# Patient Record
Sex: Female | Born: 1950
Health system: Southern US, Community
[De-identification: ages and names within clinical notes are randomized; demographics above are authoritative.]

## PROBLEM LIST (undated history)

## (undated) DIAGNOSIS — M1990 Unspecified osteoarthritis, unspecified site: Secondary | ICD-10-CM

## (undated) DIAGNOSIS — Z972 Presence of dental prosthetic device (complete) (partial): Secondary | ICD-10-CM

## (undated) DIAGNOSIS — Z973 Presence of spectacles and contact lenses: Secondary | ICD-10-CM

## (undated) DIAGNOSIS — I10 Essential (primary) hypertension: Secondary | ICD-10-CM

## (undated) DIAGNOSIS — M4310 Spondylolisthesis, site unspecified: Secondary | ICD-10-CM

## (undated) DIAGNOSIS — Z800 Family history of malignant neoplasm of digestive organs: Secondary | ICD-10-CM

## (undated) DIAGNOSIS — F32A Depression, unspecified: Secondary | ICD-10-CM

## (undated) DIAGNOSIS — R0600 Dyspnea, unspecified: Secondary | ICD-10-CM

## (undated) DIAGNOSIS — F329 Major depressive disorder, single episode, unspecified: Secondary | ICD-10-CM

## (undated) DIAGNOSIS — M533 Sacrococcygeal disorders, not elsewhere classified: Secondary | ICD-10-CM

## (undated) HISTORY — DX: Family history of malignant neoplasm of digestive organs: Z80.0

## (undated) HISTORY — PX: ABDOMINAL HYSTERECTOMY: SHX81

## (undated) HISTORY — PX: TUBAL LIGATION: SHX77

## (undated) HISTORY — PX: MULTIPLE TOOTH EXTRACTIONS: SHX2053

## (undated) HISTORY — DX: Dyspnea, unspecified: R06.00

## (undated) HISTORY — PX: ROTATOR CUFF REPAIR: SHX139

---

## 2014-09-28 NOTE — Patient Instructions (Addendum)
Your procedure is scheduled on: 10/06/2014  Report to [facility] at  1130   AM.  Call this number if you have problems the morning of surgery: [phone]   Do not eat food or drink liquids :After Midnight.      Take these medicines the morning of surgery with A SIP OF WATER: Norco and Xanax - if needed.   Do not wear jewelry, make-up or nail polish.  Do not wear lotions, powders, or perfumes.   Do not shave 48 hours prior to surgery.  Do not bring valuables to the hospital.  Contacts, dentures or bridgework may not be worn into surgery.  Leave suitcase in the car. After surgery it may be brought to your room.  For patients admitted to the hospital, checkout time is 11:00 AM the day of discharge.   Patients discharged the day of surgery will not be allowed to drive home.  :     Please read over the following fact sheets that you were given: Coughing and Deep Breathing, Surgical Site Infection Prevention, Anesthesia Post-op Instructions and Care and Recovery After Surgery    Cataract A cataract is a clouding of the lens of the eye. When a lens becomes cloudy, vision is reduced based on the degree and nature of the clouding. Many cataracts reduce vision to some degree. Some cataracts make people more near-sighted as they develop. Other cataracts increase glare. Cataracts that are ignored and become worse can sometimes look white. The white color can be seen through the pupil. CAUSES   Aging. However, cataracts may occur at any age, even in newborns.   Certain drugs.   Trauma to the eye.   Certain diseases such as diabetes.   Specific eye diseases such as chronic inflammation inside the eye or a sudden attack of a rare form of glaucoma.   Inherited or acquired medical problems.  SYMPTOMS   Gradual, progressive drop in vision in the affected eye.   Severe, rapid visual loss. This most often happens when trauma is the cause.  DIAGNOSIS  To detect a cataract, an eye doctor examines  the lens. Cataracts are best diagnosed with an exam of the eyes with the pupils enlarged (dilated) by drops.  TREATMENT  For an early cataract, vision may improve by using different eyeglasses or stronger lighting. If that does not help your vision, surgery is the only effective treatment. A cataract needs to be surgically removed when vision loss interferes with your everyday activities, such as driving, reading, or watching TV. A cataract may also have to be removed if it prevents examination or treatment of another eye problem. Surgery removes the cloudy lens and usually replaces it with a substitute lens (intraocular lens, IOL).  At a time when both you and your doctor agree, the cataract will be surgically removed. If you have cataracts in both eyes, only one is usually removed at a time. This allows the operated eye to heal and be out of danger from any possible problems after surgery (such as infection or poor wound healing). In rare cases, a cataract may be doing damage to your eye. In these cases, your caregiver may advise surgical removal right away. The vast majority of people who have cataract surgery have better vision afterward. HOME CARE INSTRUCTIONS  If you are not planning surgery, you may be asked to do the following:  Use different eyeglasses.   Use stronger or brighter lighting.   Ask your eye doctor about reducing your  medicine dose or changing medicines if it is thought that a medicine caused your cataract. Changing medicines does not make the cataract go away on its own.   Become familiar with your surroundings. Poor vision can lead to injury. Avoid bumping into things on the affected side. You are at a higher risk for tripping or falling.   Exercise extreme care when driving or operating machinery.   Wear sunglasses if you are sensitive to bright light or experiencing problems with glare.  SEEK IMMEDIATE MEDICAL CARE IF:   You have a worsening or sudden vision loss.    You notice redness, swelling, or increasing pain in the eye.   You have a fever.  Document Released: 08/26/2005 Document Revised: 08/15/2011 Document Reviewed: 04/19/2011 [facility] Patient Information 2012 [place].PATIENT INSTRUCTIONS POST-ANESTHESIA  IMMEDIATELY FOLLOWING SURGERY:  Do not drive or operate machinery for the first twenty four hours after surgery.  Do not make any important decisions for twenty four hours after surgery or while taking narcotic pain medications or sedatives.  If you develop intractable nausea and vomiting or a severe headache please notify your doctor immediately.  FOLLOW-UP:  Please make an appointment with your surgeon as instructed. You do not need to follow up with anesthesia unless specifically instructed to do so.  WOUND CARE INSTRUCTIONS (if applicable):  Keep a dry clean dressing on the anesthesia/puncture wound site if there is drainage.  Once the wound has quit draining you may leave it open to air.  Generally you should leave the bandage intact for twenty four hours unless there is drainage.  If the epidural site drains for more than 36-48 hours please call the anesthesia department.  QUESTIONS?:  Please feel free to call your physician or the hospital operator if you have any questions, and they will be happy to assist you.

## 2014-09-29 ENCOUNTER — Encounter (HOSPITAL_COMMUNITY): Payer: Self-pay

## 2014-09-29 ENCOUNTER — Encounter (HOSPITAL_COMMUNITY)
Admission: RE | Admit: 2014-09-29 | Discharge: 2014-09-29 | Disposition: A | Discharge status: Home / Self Care | Payer: BLUE CROSS/BLUE SHIELD | Source: Ambulatory Visit | Attending: Ophthalmology | Admitting: Ophthalmology

## 2014-09-29 DIAGNOSIS — H269 Unspecified cataract: Secondary | ICD-10-CM | POA: Diagnosis not present

## 2014-09-29 DIAGNOSIS — Z01818 Encounter for other preprocedural examination: Secondary | ICD-10-CM | POA: Diagnosis not present

## 2014-09-29 HISTORY — DX: Depression, unspecified: F32.A

## 2014-09-29 HISTORY — DX: Essential (primary) hypertension: I10

## 2014-09-29 HISTORY — DX: Major depressive disorder, single episode, unspecified: F32.9

## 2014-09-29 LAB — BASIC METABOLIC PANEL
Anion gap: 7 (ref 5–15)
BUN: 14 mg/dL (ref 6–23)
CALCIUM: 9.5 mg/dL (ref 8.4–10.5)
CO2: 28 mmol/L (ref 19–32)
Chloride: 104 mEq/L (ref 96–112)
Creatinine, Ser: 0.72 mg/dL (ref 0.50–1.10)
GFR calc non Af Amer: >90 mL/min (ref >90)
Glucose, Bld: 143 mg/dL — ABNORMAL HIGH (ref 70–99)
Potassium: 3.6 mmol/L (ref 3.5–5.1)
Sodium: 139 mmol/L (ref 135–145)

## 2014-09-29 LAB — CBC
HEMATOCRIT: 38.5 % (ref 36.0–46.0)
HEMOGLOBIN: 12.5 g/dL (ref 12.0–15.0)
MCH: 28.5 pg (ref 26.0–34.0)
MCHC: 32.5 g/dL (ref 30.0–36.0)
MCV: 87.9 fL (ref 78.0–100.0)
PLATELETS: 328 10*3/uL (ref 150–400)
RBC: 4.38 MIL/uL (ref 3.87–5.11)
RDW: 13.7 % (ref 11.5–15.5)
WBC: 6.5 10*3/uL (ref 4.0–10.5)

## 2014-10-05 MED ORDER — CYCLOPENTOLATE-PHENYLEPHRINE OP SOLN OPTIME - NO CHARGE
OPHTHALMIC | Status: AC
  Filled 2014-10-05: qty 2

## 2014-10-05 MED ORDER — TETRACAINE HCL 0.5 % OP SOLN
OPHTHALMIC | Status: AC
  Filled 2014-10-05: qty 2

## 2014-10-05 MED ORDER — NEOMYCIN-POLYMYXIN-DEXAMETH 3.5-10000-0.1 OP SUSP
OPHTHALMIC | Status: AC
  Filled 2014-10-05: qty 5

## 2014-10-05 MED ORDER — LIDOCAINE HCL (PF) 1 % IJ SOLN
INTRAMUSCULAR | Status: AC
  Filled 2014-10-05: qty 2

## 2014-10-05 MED ORDER — PHENYLEPHRINE HCL 2.5 % OP SOLN
OPHTHALMIC | Status: AC
  Filled 2014-10-05: qty 15

## 2014-10-05 MED ORDER — LIDOCAINE HCL 3.5 % OP GEL
OPHTHALMIC | Status: AC
  Filled 2014-10-05: qty 1

## 2014-10-06 ENCOUNTER — Encounter (HOSPITAL_COMMUNITY)
Admission: RE | Disposition: A | Discharge status: Home / Self Care | Payer: Self-pay | Source: Ambulatory Visit | Attending: Ophthalmology

## 2014-10-06 ENCOUNTER — Ambulatory Visit (HOSPITAL_COMMUNITY)
Admission: RE | Admit: 2014-10-06 | Discharge: 2014-10-06 | Disposition: A | Discharge status: Home / Self Care | Payer: BLUE CROSS/BLUE SHIELD | Source: Ambulatory Visit | Attending: Ophthalmology | Admitting: Ophthalmology

## 2014-10-06 ENCOUNTER — Ambulatory Visit (HOSPITAL_COMMUNITY): Payer: BLUE CROSS/BLUE SHIELD | Admitting: Anesthesiology

## 2014-10-06 ENCOUNTER — Encounter (HOSPITAL_COMMUNITY): Payer: Self-pay | Admitting: *Deleted

## 2014-10-06 DIAGNOSIS — H25812 Combined forms of age-related cataract, left eye: Secondary | ICD-10-CM | POA: Diagnosis not present

## 2014-10-06 HISTORY — PX: CATARACT EXTRACTION W/PHACO: SHX586

## 2014-10-06 SURGERY — PHACOEMULSIFICATION, CATARACT, WITH IOL INSERTION
Anesthesia: Monitor Anesthesia Care | Site: Eye | Laterality: Left

## 2014-10-06 MED ORDER — TETRACAINE HCL 0.5 % OP SOLN
1.0000 [drp] | OPHTHALMIC | Status: AC
  Administered 2014-10-06 (×3): 1 [drp] via OPHTHALMIC

## 2014-10-06 MED ORDER — MIDAZOLAM HCL 2 MG/2ML IJ SOLN
1.0000 mg | INTRAMUSCULAR | Status: DC | PRN
  Administered 2014-10-06: 2 mg via INTRAVENOUS

## 2014-10-06 MED ORDER — LIDOCAINE 3.5 % OP GEL OPTIME - NO CHARGE
OPHTHALMIC | Status: DC | PRN
  Administered 2014-10-06: 1 [drp] via OPHTHALMIC

## 2014-10-06 MED ORDER — BSS IO SOLN
INTRAOCULAR | Status: DC | PRN
  Administered 2014-10-06: 15 mL via INTRAOCULAR

## 2014-10-06 MED ORDER — NEOMYCIN-POLYMYXIN-DEXAMETH 3.5-10000-0.1 OP SUSP
OPHTHALMIC | Status: DC | PRN
  Administered 2014-10-06: 1 [drp] via OPHTHALMIC

## 2014-10-06 MED ORDER — EPINEPHRINE HCL 1 MG/ML IJ SOLN
INTRAMUSCULAR | Status: AC
  Filled 2014-10-06: qty 1

## 2014-10-06 MED ORDER — CYCLOPENTOLATE-PHENYLEPHRINE 0.2-1 % OP SOLN
1.0000 [drp] | OPHTHALMIC | Status: AC
Start: 2014-10-06 — End: 2014-10-06
  Administered 2014-10-06 (×3): 1 [drp] via OPHTHALMIC

## 2014-10-06 MED ORDER — FENTANYL CITRATE 0.05 MG/ML IJ SOLN
25.0000 ug | INTRAMUSCULAR | Status: AC
  Administered 2014-10-06 (×2): 25 ug via INTRAVENOUS

## 2014-10-06 MED ORDER — LACTATED RINGERS IV SOLN
INTRAVENOUS | Status: DC
  Administered 2014-10-06: 12:00:00 via INTRAVENOUS

## 2014-10-06 MED ORDER — LIDOCAINE HCL 3.5 % OP GEL
1.0000 "application " | Freq: Once | OPHTHALMIC | Status: AC
  Administered 2014-10-06: 1 via OPHTHALMIC

## 2014-10-06 MED ORDER — LIDOCAINE HCL (PF) 1 % IJ SOLN
INTRAMUSCULAR | Status: DC | PRN
  Administered 2014-10-06: .5 mL

## 2014-10-06 MED ORDER — PROVISC 10 MG/ML IO SOLN
INTRAOCULAR | Status: DC | PRN
  Administered 2014-10-06: 0.85 mL via INTRAOCULAR

## 2014-10-06 MED ORDER — PHENYLEPHRINE HCL 2.5 % OP SOLN
1.0000 [drp] | OPHTHALMIC | Status: AC
Start: 2014-10-06 — End: 2014-10-06
  Administered 2014-10-06 (×3): 1 [drp] via OPHTHALMIC

## 2014-10-06 MED ORDER — BSS IO SOLN
INTRAOCULAR | Status: DC | PRN
  Administered 2014-10-06: 12:00:00

## 2014-10-06 MED ORDER — FENTANYL CITRATE 0.05 MG/ML IJ SOLN
INTRAMUSCULAR | Status: AC
  Filled 2014-10-06: qty 2

## 2014-10-06 MED ORDER — MIDAZOLAM HCL 2 MG/2ML IJ SOLN
INTRAMUSCULAR | Status: AC
  Filled 2014-10-06: qty 2

## 2014-10-06 MED ORDER — POVIDONE-IODINE 5 % OP SOLN
OPHTHALMIC | Status: DC | PRN
  Administered 2014-10-06: 1 via OPHTHALMIC

## 2014-10-06 SURGICAL SUPPLY — 11 items
CLOTH BEACON ORANGE TIMEOUT ST (SAFETY) ×3 IMPLANT
EYE SHIELD UNIVERSAL CLEAR (GAUZE/BANDAGES/DRESSINGS) ×3 IMPLANT
GLOVE BIOGEL PI IND STRL 6.5 (GLOVE) ×1 IMPLANT
GLOVE BIOGEL PI INDICATOR 6.5 (GLOVE) ×2
GLOVE EXAM NITRILE LRG STRL (GLOVE) ×3 IMPLANT
PAD ARMBOARD 7.5X6 YLW CONV (MISCELLANEOUS) ×3 IMPLANT
SIGHTPATH CAT PROC W REG LENS (Ophthalmic Related) ×3 IMPLANT
SYRINGE LUER LOK 1CC (MISCELLANEOUS) ×3 IMPLANT
TAPE SURG TRANSPORE 1 IN (GAUZE/BANDAGES/DRESSINGS) ×1 IMPLANT
TAPE SURGICAL TRANSPORE 1 IN (GAUZE/BANDAGES/DRESSINGS) ×2
WATER STERILE IRR 250ML POUR (IV SOLUTION) ×3 IMPLANT

## 2014-10-06 NOTE — Discharge Instructions (Signed)

## 2014-10-06 NOTE — H&P (Signed)
I have reviewed the H&P, the patient was re-examined, and I have identified no interval changes in medical condition and plan of care since the history and physical of record  

## 2014-10-06 NOTE — Transfer of Care (Signed)
Immediate Anesthesia Transfer of Care Note  Patient: Kristin SalinasBarbara S Bush  Procedure(s) Performed: Procedure(s) with comments: CATARACT EXTRACTION PHACO AND INTRAOCULAR LENS PLACEMENT LEFT EYE (Left) - CDE:5.49  Patient Location: Short Stay  Anesthesia Type:MAC  Level of Consciousness: awake  Airway & Oxygen Therapy: Patient Spontanous Breathing  Post-op Assessment: Report given to PACU RN  Post vital signs: Reviewed  Last Vitals:  Filed Vitals:   10/06/14 1200  BP: 131/67  Pulse:   Temp:   Resp: 17    Complications: No apparent anesthesia complications

## 2014-10-06 NOTE — Anesthesia Preprocedure Evaluation (Addendum)
Anesthesia Evaluation  Patient identified by MRN, date of birth, ID band Patient awake    Reviewed: Allergy & Precautions, NPO status , Patient's Chart, lab work & pertinent test results  Airway Mallampati: III  TM Distance: >3 FB     Dental  (+) Teeth Intact, Partial Upper   Pulmonary neg pulmonary ROS,  breath sounds clear to auscultation        Cardiovascular hypertension, Pt. on medications Rhythm:Regular Rate:Normal     Neuro/Psych PSYCHIATRIC DISORDERS Depression    GI/Hepatic   Endo/Other    Renal/GU      Musculoskeletal   Abdominal   Peds  Hematology   Anesthesia Other Findings   Reproductive/Obstetrics                            Anesthesia Physical Anesthesia Plan  ASA: II  Anesthesia Plan: MAC   Post-op Pain Management:    Induction: Intravenous  Airway Management Planned: Nasal Cannula  Additional Equipment:   Intra-op Plan:   Post-operative Plan:   Informed Consent: I have reviewed the patients History and Physical, chart, labs and discussed the procedure including the risks, benefits and alternatives for the proposed anesthesia with the patient or authorized representative who has indicated his/her understanding and acceptance.     Plan Discussed with:   Anesthesia Plan Comments:         Anesthesia Quick Evaluation

## 2014-10-06 NOTE — Op Note (Signed)
Date of Admission: 10/06/2014  Date of Surgery: 10/06/2014   Pre-Op Dx: Cataract Left Eye  Post-Op Dx: Senile Combined Cataract Left  Eye,  Dx Code Z61.096H25.812  Surgeon: Gemma PayorKerry Xue Low, M.D.  Assistants: None  Anesthesia: Topical with MAC  Indications: Painless, progressive loss of vision with compromise of daily activities.  Surgery: Cataract Extraction with Intraocular lens Implant Left Eye  Discription: The patient had dilating drops and viscous lidocaine placed into the Left eye in the pre-op holding area. After transfer to the operating room, a time out was performed. The patient was then prepped and draped. Beginning with a 75 degree blade a paracentesis port was made at the surgeon's 2 o'clock position. The anterior chamber was then filled with 1% non-preserved lidocaine. This was followed by filling the anterior chamber with Provisc.  A 2.344mm keratome blade was used to make a clear corneal incision at the temporal limbus.  A bent cystatome needle was used to create a continuous tear capsulotomy. Hydrodissection was performed with balanced salt solution on a Fine canula. The lens nucleus was then removed using the phacoemulsification handpiece. Residual cortex was removed with the I&A handpiece. The anterior chamber and capsular bag were refilled with Provisc. A posterior chamber intraocular lens was placed into the capsular bag with it's injector. The implant was positioned with the Kuglan hook. The Provisc was then removed from the anterior chamber and capsular bag with the I&A handpiece. Stromal hydration of the main incision and paracentesis port was performed with BSS on a Fine canula. The wounds were tested for leak which was negative. The patient tolerated the procedure well. There were no operative complications. The patient was then transferred to the recovery room in stable condition.  Complications: None  Specimen: None  EBL: None  Prosthetic device: Hoya iSert 250, power 15.5 D, SN  [id number].

## 2014-10-06 NOTE — Anesthesia Postprocedure Evaluation (Signed)
  Anesthesia Post-op Note  Patient: Kristin PainBarbara S Bush  Procedure(s) Performed: Procedure(s) with comments: CATARACT EXTRACTION PHACO AND INTRAOCULAR LENS PLACEMENT LEFT EYE (Left) - CDE:5.49  Patient Location: Short Stay  Anesthesia Type:MAC  Level of Consciousness: awake, alert  and oriented  Airway and Oxygen Therapy: Patient Spontanous Breathing  Post-op Bush: none  Post-op Assessment: Post-op Vital signs reviewed, Patient's Cardiovascular Status Stable, Respiratory Function Stable, Patent Airway and No signs of Nausea or vomiting  Post-op Vital Signs: Reviewed and stable  Last Vitals:  Filed Vitals:   10/06/14 1200  BP: 131/67  Pulse:   Temp:   Resp: 17    Complications: No apparent anesthesia complications

## 2014-10-07 ENCOUNTER — Encounter (HOSPITAL_COMMUNITY): Payer: Self-pay | Admitting: Ophthalmology

## 2014-10-11 ENCOUNTER — Encounter (HOSPITAL_COMMUNITY)
Admission: RE | Admit: 2014-10-11 | Discharge: 2014-10-11 | Disposition: A | Discharge status: Home / Self Care | Payer: BLUE CROSS/BLUE SHIELD | Source: Ambulatory Visit | Attending: Ophthalmology | Admitting: Ophthalmology

## 2014-10-12 ENCOUNTER — Encounter (HOSPITAL_COMMUNITY): Payer: Self-pay

## 2014-10-14 MED ORDER — LIDOCAINE HCL (PF) 1 % IJ SOLN
INTRAMUSCULAR | Status: AC
  Filled 2014-10-14: qty 2

## 2014-10-14 MED ORDER — TETRACAINE HCL 0.5 % OP SOLN
OPHTHALMIC | Status: AC
  Filled 2014-10-14: qty 2

## 2014-10-14 MED ORDER — LIDOCAINE HCL 3.5 % OP GEL
OPHTHALMIC | Status: AC
  Filled 2014-10-14: qty 1

## 2014-10-14 MED ORDER — PHENYLEPHRINE HCL 2.5 % OP SOLN
OPHTHALMIC | Status: AC
  Filled 2014-10-14: qty 15

## 2014-10-14 MED ORDER — CYCLOPENTOLATE-PHENYLEPHRINE OP SOLN OPTIME - NO CHARGE
OPHTHALMIC | Status: AC
  Filled 2014-10-14: qty 2

## 2014-10-14 MED ORDER — NEOMYCIN-POLYMYXIN-DEXAMETH 3.5-10000-0.1 OP SUSP
OPHTHALMIC | Status: AC
  Filled 2014-10-14: qty 5

## 2014-10-17 ENCOUNTER — Ambulatory Visit (HOSPITAL_COMMUNITY): Payer: BLUE CROSS/BLUE SHIELD | Admitting: Anesthesiology

## 2014-10-17 ENCOUNTER — Ambulatory Visit (HOSPITAL_COMMUNITY)
Admission: RE | Admit: 2014-10-17 | Discharge: 2014-10-17 | Disposition: A | Discharge status: Home / Self Care | Payer: BLUE CROSS/BLUE SHIELD | Source: Ambulatory Visit | Attending: Ophthalmology | Admitting: Ophthalmology

## 2014-10-17 ENCOUNTER — Encounter (HOSPITAL_COMMUNITY): Payer: Self-pay

## 2014-10-17 ENCOUNTER — Encounter (HOSPITAL_COMMUNITY)
Admission: RE | Disposition: A | Discharge status: Home / Self Care | Payer: Self-pay | Source: Ambulatory Visit | Attending: Ophthalmology

## 2014-10-17 DIAGNOSIS — H25811 Combined forms of age-related cataract, right eye: Secondary | ICD-10-CM | POA: Insufficient documentation

## 2014-10-17 HISTORY — PX: CATARACT EXTRACTION W/PHACO: SHX586

## 2014-10-17 SURGERY — PHACOEMULSIFICATION, CATARACT, WITH IOL INSERTION
Anesthesia: Monitor Anesthesia Care | Site: Eye | Laterality: Right

## 2014-10-17 MED ORDER — MIDAZOLAM HCL 2 MG/2ML IJ SOLN
INTRAMUSCULAR | Status: AC
Start: 2014-10-17 — End: 2014-10-17
  Filled 2014-10-17: qty 2

## 2014-10-17 MED ORDER — PROVISC 10 MG/ML IO SOLN
INTRAOCULAR | Status: DC | PRN
  Administered 2014-10-17: 0.85 mL via INTRAOCULAR

## 2014-10-17 MED ORDER — PHENYLEPHRINE HCL 2.5 % OP SOLN
1.0000 [drp] | OPHTHALMIC | Status: AC
  Administered 2014-10-17 (×3): 1 [drp] via OPHTHALMIC

## 2014-10-17 MED ORDER — EPINEPHRINE HCL 1 MG/ML IJ SOLN
INTRAOCULAR | Status: DC | PRN
  Administered 2014-10-17: 500 mL

## 2014-10-17 MED ORDER — BSS IO SOLN
INTRAOCULAR | Status: DC | PRN
  Administered 2014-10-17: 15 mL

## 2014-10-17 MED ORDER — POVIDONE-IODINE 5 % OP SOLN
OPHTHALMIC | Status: DC | PRN
  Administered 2014-10-17: 1 via OPHTHALMIC

## 2014-10-17 MED ORDER — FENTANYL CITRATE 0.05 MG/ML IJ SOLN
INTRAMUSCULAR | Status: AC
  Filled 2014-10-17: qty 2

## 2014-10-17 MED ORDER — TETRACAINE HCL 0.5 % OP SOLN
1.0000 [drp] | OPHTHALMIC | Status: AC
  Administered 2014-10-17 (×3): 1 [drp] via OPHTHALMIC

## 2014-10-17 MED ORDER — MIDAZOLAM HCL 2 MG/2ML IJ SOLN
1.0000 mg | INTRAMUSCULAR | Status: DC | PRN
Start: 2014-10-17 — End: 2014-10-17
  Administered 2014-10-17 (×2): 2 mg via INTRAVENOUS
  Filled 2014-10-17: qty 2

## 2014-10-17 MED ORDER — EPINEPHRINE HCL 1 MG/ML IJ SOLN
INTRAMUSCULAR | Status: AC
  Filled 2014-10-17: qty 1

## 2014-10-17 MED ORDER — CYCLOPENTOLATE-PHENYLEPHRINE 0.2-1 % OP SOLN
1.0000 [drp] | OPHTHALMIC | Status: AC
  Administered 2014-10-17 (×3): 1 [drp] via OPHTHALMIC

## 2014-10-17 MED ORDER — FENTANYL CITRATE 0.05 MG/ML IJ SOLN
25.0000 ug | INTRAMUSCULAR | Status: AC
  Administered 2014-10-17 (×2): 25 ug via INTRAVENOUS

## 2014-10-17 MED ORDER — LIDOCAINE HCL (PF) 1 % IJ SOLN
INTRAMUSCULAR | Status: DC | PRN
  Administered 2014-10-17: .4 mL

## 2014-10-17 MED ORDER — LACTATED RINGERS IV SOLN
INTRAVENOUS | Status: DC
  Administered 2014-10-17: 08:00:00 via INTRAVENOUS

## 2014-10-17 MED ORDER — LIDOCAINE HCL 3.5 % OP GEL: 1.0000 "application " | Freq: Once | OPHTHALMIC | Status: DC

## 2014-10-17 MED ORDER — NEOMYCIN-POLYMYXIN-DEXAMETH 3.5-10000-0.1 OP SUSP
OPHTHALMIC | Status: DC | PRN
  Administered 2014-10-17: 2 [drp] via OPHTHALMIC

## 2014-10-17 SURGICAL SUPPLY — 11 items

## 2014-10-17 NOTE — Discharge Instructions (Signed)

## 2014-10-17 NOTE — Anesthesia Postprocedure Evaluation (Signed)
  Anesthesia Post-op Note  Patient: Kristin Bush  Procedure(s) Performed: Procedure(s): CATARACT EXTRACTION PHACO AND INTRAOCULAR LENS PLACEMENT RIGHT EYE CDE=9.81 (Right)  Patient Location: Short Stay  Anesthesia Type:MAC  Level of Consciousness: awake, alert  and oriented  Airway and Oxygen Therapy: Patient Spontanous Breathing  Post-op Pain: none  Post-op Assessment: Post-op Vital signs reviewed, Patient's Cardiovascular Status Stable, Respiratory Function Stable, Patent Airway and No signs of Nausea or vomiting  Post-op Vital Signs: Reviewed and stable  Last Vitals:  Filed Vitals:   10/17/14 0825  BP: 115/62  Temp:   Resp: 16    Complications: No apparent anesthesia complications

## 2014-10-17 NOTE — Op Note (Signed)
Date of Admission: 10/17/2014  Date of Surgery: 10/17/2014   Pre-Op Dx: Cataract Right Eye  Post-Op Dx: Senile Combined Cataract Right  Eye,  Dx Code W29.562H25.811  Surgeon: Gemma PayorKerry Quinterius Gaida, M.D.  Assistants: None  Anesthesia: Topical with MAC  Indications: Painless, progressive loss of vision with compromise of daily activities.  Surgery: Cataract Extraction with Intraocular lens Implant Right Eye  Discription: The patient had dilating drops and viscous lidocaine placed into the Right eye in the pre-op holding area. After transfer to the operating room, a time out was performed. The patient was then prepped and draped. Beginning with a 75 degree blade a paracentesis port was made at the surgeon's 2 o'clock position. The anterior chamber was then filled with 1% non-preserved lidocaine. This was followed by filling the anterior chamber with Provisc.  A 2.724mm keratome blade was used to make a clear corneal incision at the temporal limbus.  A bent cystatome needle was used to create a continuous tear capsulotomy. Hydrodissection was performed with balanced salt solution on a Fine canula. The lens nucleus was then removed using the phacoemulsification handpiece. Residual cortex was removed with the I&A handpiece. The anterior chamber and capsular bag were refilled with Provisc. A posterior chamber intraocular lens was placed into the capsular bag with it's injector. The implant was positioned with the Kuglan hook. The Provisc was then removed from the anterior chamber and capsular bag with the I&A handpiece. Stromal hydration of the main incision and paracentesis port was performed with BSS on a Fine canula. The wounds were tested for leak which was negative. The patient tolerated the procedure well. There were no operative complications. The patient was then transferred to the recovery room in stable condition.  Complications: None  Specimen: None  EBL: None  Prosthetic device: Hoya iSert 250, power 18.0 D,  SN [id number].

## 2014-10-17 NOTE — Transfer of Care (Signed)
Immediate Anesthesia Transfer of Care Note  Patient: Kristin SalinasBarbara S Bush  Procedure(s) Performed: Procedure(s): CATARACT EXTRACTION PHACO AND INTRAOCULAR LENS PLACEMENT RIGHT EYE CDE=9.81 (Right)  Patient Location: Short Stay  Anesthesia Type:MAC  Level of Consciousness: awake  Airway & Oxygen Therapy: Patient Spontanous Breathing  Post-op Assessment: Report given to RN  Post vital signs: Reviewed  Last Vitals:  Filed Vitals:   10/17/14 0825  BP: 115/62  Temp:   Resp: 16    Complications: No apparent anesthesia complications

## 2014-10-17 NOTE — H&P (Signed)
I have reviewed the H&P, the patient was re-examined, and I have identified no interval changes in medical condition and plan of care since the history and physical of record  

## 2014-10-17 NOTE — Anesthesia Preprocedure Evaluation (Signed)
Anesthesia Evaluation  Patient identified by MRN, date of birth, ID band Patient awake    Reviewed: Allergy & Precautions, NPO status , Patient's Chart, lab work & pertinent test results  Airway Mallampati: III  TM Distance: >3 FB     Dental  (+) Teeth Intact, Partial Upper   Pulmonary neg pulmonary ROS,  breath sounds clear to auscultation        Cardiovascular hypertension, Pt. on medications Rhythm:Regular Rate:Normal     Neuro/Psych PSYCHIATRIC DISORDERS Depression    GI/Hepatic   Endo/Other    Renal/GU      Musculoskeletal   Abdominal   Peds  Hematology   Anesthesia Other Findings   Reproductive/Obstetrics                             Anesthesia Physical Anesthesia Plan  ASA: II  Anesthesia Plan: MAC   Post-op Pain Management:    Induction: Intravenous  Airway Management Planned: Nasal Cannula  Additional Equipment:   Intra-op Plan:   Post-operative Plan:   Informed Consent: I have reviewed the patients History and Physical, chart, labs and discussed the procedure including the risks, benefits and alternatives for the proposed anesthesia with the patient or authorized representative who has indicated his/her understanding and acceptance.     Plan Discussed with:   Anesthesia Plan Comments:         Anesthesia Quick Evaluation

## 2014-10-18 ENCOUNTER — Encounter (HOSPITAL_COMMUNITY): Payer: Self-pay | Admitting: Ophthalmology

## 2017-04-08 DIAGNOSIS — I10 Essential (primary) hypertension: Secondary | ICD-10-CM | POA: Insufficient documentation

## 2017-04-08 DIAGNOSIS — Z6834 Body mass index (BMI) 34.0-34.9, adult: Secondary | ICD-10-CM | POA: Insufficient documentation

## 2017-04-08 DIAGNOSIS — N814 Uterovaginal prolapse, unspecified: Secondary | ICD-10-CM | POA: Insufficient documentation

## 2018-02-23 DIAGNOSIS — M79662 Pain in left lower leg: Secondary | ICD-10-CM | POA: Diagnosis not present

## 2018-02-25 DIAGNOSIS — Z713 Dietary counseling and surveillance: Secondary | ICD-10-CM | POA: Diagnosis not present

## 2018-02-25 DIAGNOSIS — I10 Essential (primary) hypertension: Secondary | ICD-10-CM | POA: Diagnosis not present

## 2018-02-25 DIAGNOSIS — Z6835 Body mass index (BMI) 35.0-35.9, adult: Secondary | ICD-10-CM | POA: Diagnosis not present

## 2018-02-25 DIAGNOSIS — M79609 Pain in unspecified limb: Secondary | ICD-10-CM | POA: Diagnosis not present

## 2018-02-25 DIAGNOSIS — Z299 Encounter for prophylactic measures, unspecified: Secondary | ICD-10-CM | POA: Diagnosis not present

## 2018-02-25 DIAGNOSIS — M79605 Pain in left leg: Secondary | ICD-10-CM | POA: Diagnosis not present

## 2018-02-27 DIAGNOSIS — R1030 Lower abdominal pain, unspecified: Secondary | ICD-10-CM | POA: Diagnosis not present

## 2018-02-27 DIAGNOSIS — Z6834 Body mass index (BMI) 34.0-34.9, adult: Secondary | ICD-10-CM | POA: Diagnosis not present

## 2018-03-03 DIAGNOSIS — I10 Essential (primary) hypertension: Secondary | ICD-10-CM | POA: Diagnosis not present

## 2018-03-03 DIAGNOSIS — Z6835 Body mass index (BMI) 35.0-35.9, adult: Secondary | ICD-10-CM | POA: Diagnosis not present

## 2018-03-03 DIAGNOSIS — M79605 Pain in left leg: Secondary | ICD-10-CM | POA: Diagnosis not present

## 2018-03-03 DIAGNOSIS — M79604 Pain in right leg: Secondary | ICD-10-CM | POA: Diagnosis not present

## 2018-03-03 DIAGNOSIS — Z299 Encounter for prophylactic measures, unspecified: Secondary | ICD-10-CM | POA: Diagnosis not present

## 2018-03-03 DIAGNOSIS — I8393 Asymptomatic varicose veins of bilateral lower extremities: Secondary | ICD-10-CM | POA: Diagnosis not present

## 2018-03-09 ENCOUNTER — Other Ambulatory Visit: Payer: Self-pay

## 2018-03-09 DIAGNOSIS — I83893 Varicose veins of bilateral lower extremities with other complications: Secondary | ICD-10-CM

## 2018-03-18 DIAGNOSIS — Z299 Encounter for prophylactic measures, unspecified: Secondary | ICD-10-CM | POA: Diagnosis not present

## 2018-03-18 DIAGNOSIS — I8393 Asymptomatic varicose veins of bilateral lower extremities: Secondary | ICD-10-CM | POA: Diagnosis not present

## 2018-03-18 DIAGNOSIS — Z6835 Body mass index (BMI) 35.0-35.9, adult: Secondary | ICD-10-CM | POA: Diagnosis not present

## 2018-03-18 DIAGNOSIS — I10 Essential (primary) hypertension: Secondary | ICD-10-CM | POA: Diagnosis not present

## 2018-03-18 DIAGNOSIS — M5416 Radiculopathy, lumbar region: Secondary | ICD-10-CM | POA: Diagnosis not present

## 2018-03-18 DIAGNOSIS — G510 Bell's palsy: Secondary | ICD-10-CM | POA: Diagnosis not present

## 2018-03-24 DIAGNOSIS — M5416 Radiculopathy, lumbar region: Secondary | ICD-10-CM | POA: Diagnosis not present

## 2018-03-24 DIAGNOSIS — I8393 Asymptomatic varicose veins of bilateral lower extremities: Secondary | ICD-10-CM | POA: Diagnosis not present

## 2018-03-24 DIAGNOSIS — I10 Essential (primary) hypertension: Secondary | ICD-10-CM | POA: Diagnosis not present

## 2018-03-24 DIAGNOSIS — Z299 Encounter for prophylactic measures, unspecified: Secondary | ICD-10-CM | POA: Diagnosis not present

## 2018-03-24 DIAGNOSIS — Z6834 Body mass index (BMI) 34.0-34.9, adult: Secondary | ICD-10-CM | POA: Diagnosis not present

## 2018-03-24 DIAGNOSIS — F419 Anxiety disorder, unspecified: Secondary | ICD-10-CM | POA: Diagnosis not present

## 2018-03-24 DIAGNOSIS — F329 Major depressive disorder, single episode, unspecified: Secondary | ICD-10-CM | POA: Diagnosis not present

## 2018-03-30 DIAGNOSIS — I10 Essential (primary) hypertension: Secondary | ICD-10-CM | POA: Diagnosis not present

## 2018-03-30 DIAGNOSIS — Z299 Encounter for prophylactic measures, unspecified: Secondary | ICD-10-CM | POA: Diagnosis not present

## 2018-03-30 DIAGNOSIS — M5416 Radiculopathy, lumbar region: Secondary | ICD-10-CM | POA: Diagnosis not present

## 2018-03-30 DIAGNOSIS — Z713 Dietary counseling and surveillance: Secondary | ICD-10-CM | POA: Diagnosis not present

## 2018-03-30 DIAGNOSIS — Z6834 Body mass index (BMI) 34.0-34.9, adult: Secondary | ICD-10-CM | POA: Diagnosis not present

## 2018-04-07 DIAGNOSIS — M545 Low back pain: Secondary | ICD-10-CM | POA: Diagnosis not present

## 2018-04-07 DIAGNOSIS — R262 Difficulty in walking, not elsewhere classified: Secondary | ICD-10-CM | POA: Diagnosis not present

## 2018-04-07 DIAGNOSIS — M5416 Radiculopathy, lumbar region: Secondary | ICD-10-CM | POA: Diagnosis not present

## 2018-04-09 DIAGNOSIS — M545 Low back pain: Secondary | ICD-10-CM | POA: Diagnosis not present

## 2018-04-09 DIAGNOSIS — M5416 Radiculopathy, lumbar region: Secondary | ICD-10-CM | POA: Diagnosis not present

## 2018-04-09 DIAGNOSIS — R262 Difficulty in walking, not elsewhere classified: Secondary | ICD-10-CM | POA: Diagnosis not present

## 2018-04-13 DIAGNOSIS — M545 Low back pain: Secondary | ICD-10-CM | POA: Diagnosis not present

## 2018-04-13 DIAGNOSIS — R262 Difficulty in walking, not elsewhere classified: Secondary | ICD-10-CM | POA: Diagnosis not present

## 2018-04-13 DIAGNOSIS — M5416 Radiculopathy, lumbar region: Secondary | ICD-10-CM | POA: Diagnosis not present

## 2018-04-15 ENCOUNTER — Emergency Department (HOSPITAL_COMMUNITY): Payer: Managed Care, Other (non HMO)

## 2018-04-15 ENCOUNTER — Emergency Department (HOSPITAL_COMMUNITY)
Admission: EM | Admit: 2018-04-15 | Discharge: 2018-04-15 | Disposition: A | Discharge status: Home / Self Care | Payer: Managed Care, Other (non HMO) | Attending: Emergency Medicine | Admitting: Emergency Medicine

## 2018-04-15 ENCOUNTER — Other Ambulatory Visit: Payer: Self-pay

## 2018-04-15 DIAGNOSIS — M5432 Sciatica, left side: Secondary | ICD-10-CM

## 2018-04-15 DIAGNOSIS — M5442 Lumbago with sciatica, left side: Secondary | ICD-10-CM | POA: Insufficient documentation

## 2018-04-15 DIAGNOSIS — Z79899 Other long term (current) drug therapy: Secondary | ICD-10-CM | POA: Insufficient documentation

## 2018-04-15 DIAGNOSIS — M545 Low back pain: Secondary | ICD-10-CM | POA: Diagnosis not present

## 2018-04-15 DIAGNOSIS — I10 Essential (primary) hypertension: Secondary | ICD-10-CM | POA: Diagnosis not present

## 2018-04-15 DIAGNOSIS — M79605 Pain in left leg: Secondary | ICD-10-CM | POA: Diagnosis present

## 2018-04-15 MED ORDER — CYCLOBENZAPRINE HCL 10 MG PO TABS: 10.0000 mg | ORAL_TABLET | Freq: Every day | ORAL | 0 refills | Status: DC

## 2018-04-15 MED ORDER — HYDROCODONE-ACETAMINOPHEN 5-325 MG PO TABS: 1.0000 | ORAL_TABLET | Freq: Four times a day (QID) | ORAL | 0 refills | Status: DC | PRN

## 2018-04-15 MED ORDER — PREDNISONE 50 MG PO TABS: 50.0000 mg | ORAL_TABLET | Freq: Every day | ORAL | 0 refills | Status: DC

## 2018-04-15 MED ORDER — OXYCODONE-ACETAMINOPHEN 5-325 MG PO TABS
1.0000 | ORAL_TABLET | Freq: Once | ORAL | Status: AC
  Administered 2018-04-15: 1 via ORAL
  Filled 2018-04-15: qty 1

## 2018-04-15 NOTE — ED Provider Notes (Signed)
[facility] EM[facility]ENCY [facility] Provider Note   CSN: [id number] Arrival date & time: 04/15/18  0550     History   Chief Complaint Chief Complaint  Patient presents with  . Leg Pain    HPI Bill SalinasBarbara S [name] is a 67 y.o. female.  HPI Patient presents to the emergency department with an 8-week history of lower back pain that radiates into her left leg.  The patient states that she seen her doctor multiple times and was due to get an MRI but has not done so yet.  The patient states the pain was so bad that she came to the emergency department.  Patient states that she cannot walk but has pain with ambulation.  Patient states that nothing seems make the condition better but certain movements palpation make the pain worse.  Patient states that she has been on several rounds of steroids as well.  The patient denies chest pain, shortness of breath, headache,blurred vision, neck pain, fever, cough, weakness, numbness, dizziness, anorexia, edema, abdominal pain, nausea, vomiting, diarrhea, rash,  dysuria, hematemesis, bloody stool, near syncope, or syncope. Past Medical History:  Diagnosis Date  . Depression   . Hypertension     There are no active problems to display for this patient.   Past Surgical History:  Procedure Laterality Date  . CATARACT EXTRACTION W/PHACO Left 10/06/2014   Procedure: CATARACT EXTRACTION PHACO AND INTRAOCULAR LENS PLACEMENT LEFT EYE;  Surgeon: Gemma PayorKerry Hunt, MD;  Location: [facility];  Service: Ophthalmology;  Laterality: Left;  CDE:5.49  . CATARACT EXTRACTION W/PHACO Right 10/17/2014   Procedure: CATARACT EXTRACTION PHACO AND INTRAOCULAR LENS PLACEMENT RIGHT EYE CDE=9.81;  Surgeon: Gemma PayorKerry Hunt, MD;  Location: [facility];  Service: Ophthalmology;  Laterality: Right;  . NO PAST SURGERIES       OB History   None      Home Medications    Prior to Admission medications   Medication Sig Start Date End Date Taking? Authorizing Provider  acetaminophen  (TYLENOL) 500 MG tablet Take 1,000 mg by mouth every 6 (six) hours as needed for headache.   Yes [provider]  baclofen (LIORESAL) 10 MG tablet Take 10 mg by mouth 3 (three) times daily as needed. 04/06/18  Yes [provider]  cyclobenzaprine (FLEXERIL) 5 MG tablet Take 5 mg by mouth 2 (two) times daily as needed for muscle spasms.   Yes [provider]  diclofenac (VOLTAREN) 75 MG EC tablet Take 75 mg by mouth 2 (two) times daily as needed for mild pain.   Yes [provider]  triamterene-hydrochlorothiazide (MAXZIDE-25) 37.5-25 MG per tablet Take 1 tablet by mouth daily.   Yes [provider]    Family History No family history on file.  Social History Social History   Tobacco Use  . Smoking status: Never Smoker  Substance Use Topics  . Alcohol use: No  . Drug use: No     Allergies   Patient has no known allergies.   Review of Systems Review of Systems All other systems negative except as documented in the HPI. All pertinent positives and negatives as reviewed in the HPI. Physical Exam Updated Vital Signs BP (!) 165/107   Pulse 82   Temp 97.8 F (36.6 C) (Oral)   Resp 16   Ht 5\' 2"  (1.575 m)   Wt 81.6 kg (180 lb)   SpO2 99%   BMI 32.92 kg/m   Physical Exam  Constitutional: She is oriented to person, place, and time.  She appears well-developed and well-nourished. No distress.  HENT:  Head: Normocephalic and atraumatic.  Mouth/Throat: Oropharynx is clear and moist.  Eyes: Pupils are equal, round, and reactive to light.  Neck: Normal range of motion. Neck supple.  Cardiovascular: Normal rate, regular rhythm and normal heart sounds. Exam reveals no gallop and no friction rub.  No murmur heard. Pulmonary/Chest: Effort normal and breath sounds normal. No respiratory distress. She has no wheezes.  Abdominal: Soft. Bowel sounds are normal. She exhibits no distension. There is no tenderness.  Musculoskeletal:       Lumbar  back: She exhibits tenderness and pain. She exhibits no bony tenderness, no swelling, no edema, no deformity, no spasm and normal pulse.       Back:  Neurological: She is alert and oriented to person, place, and time. She has normal strength and normal reflexes. No sensory deficit. She exhibits normal muscle tone. Coordination and gait normal. GCS eye subscore is 4. GCS verbal subscore is 5. GCS motor subscore is 6.  Skin: Skin is warm and dry. Capillary refill takes less than 2 seconds. No rash noted. No erythema.  Psychiatric: She has a normal mood and affect. Her behavior is normal.  Nursing note and vitals reviewed.    ED Treatments / Results  Labs (all labs ordered are listed, but only abnormal results are displayed) Labs Reviewed - No data to display  EKG None  Radiology Dg Lumbar Spine Complete  Result Date: 04/15/2018 CLINICAL DATA:  Left-sided low back pain into the left buttock and left leg with left foot numbness. EXAM: LUMBAR SPINE - COMPLETE 4+ VIEW COMPARISON:  04/12/2011 FINDINGS: Chronic progressive degenerative disc disease at L1-2 with disc space narrowing and a vacuum phenomenon. Minimal lumbar scoliosis centered at L1-2. Progressive moderate bilateral facet arthritis at L4-5 and L5-S1. Stable grade 1 spondylolisthesis at L4-5. IMPRESSION: 1. No acute abnormalities. 2. Progressive degenerative disc disease at L1-2. 3. Progressive facet arthritis at L4-5 and L5-S1 with stable grade 1 spondylolisthesis at L4-5. Electronically Signed   By: Francene Boyers M.D.   On: 04/15/2018 10:09    Procedures Procedures (including critical care time)  Medications Ordered in ED Medications  oxyCODONE-acetaminophen (PERCOCET/ROXICET) 5-325 MG per tablet 1 tablet (1 tablet Oral Given 04/15/18 1115)     Initial Impression / Assessment and Plan / ED Course  I have reviewed the triage vital signs and the nursing notes.  Pertinent labs & imaging results that were available during my care  of the patient were reviewed by me and considered in my medical decision making (see chart for details).  Clinical Course as of Apr 15 1229  Wed Apr 15, 2018  1209 DG Lumbar Spine Complete [SP]  1209 DG Lumbar Spine Complete [SP]    Clinical Course User Index [SP] Farrel Gordon, Student-PA   Patient does not have any neurological deficits and she can ambulate without difficulty.  Patient does not have any foot drop or weakness in the leg.  I do feel that the patient will need an MRI to further evaluate her issue.  There is some degenerative changes in the lumbar spine on plain films.  I feel that this is a contributing factor to whatever is causing her problem.  I did advise her she will need to speak with her physician as soon as possible about obtaining an MRI.  The patient agrees the plan and all questions were answered.  Final Clinical Impressions(s) / ED Diagnoses   Final diagnoses:  None  ED Discharge Orders    None       Charlestine Night, PA-C 04/18/18 1545    Gwyneth Sprout, MD 04/19/18 0006

## 2018-04-15 NOTE — ED Notes (Signed)
Patient verbalizes understanding of discharge instructions. Opportunity for questioning and answers were provided. Armband removed by staff, pt discharged from ED.  

## 2018-04-15 NOTE — Discharge Instructions (Signed)
Return here as needed. Call you doctor about scheduling an MRI

## 2018-04-15 NOTE — ED Triage Notes (Addendum)
Patient c/o unbearable left sided pain that begins in her left lower back/buttock area and radiates down left leg with left foot numbness. Patient states difficult to walk on left leg/foot and always feels uncomfortable. Has been to her her primary (6) times and attempted multiple medications with no relief. States that her dr was suppose to "get me an MRI" and "he hasn't".

## 2018-04-17 DIAGNOSIS — Z299 Encounter for prophylactic measures, unspecified: Secondary | ICD-10-CM | POA: Diagnosis not present

## 2018-04-17 DIAGNOSIS — Z6835 Body mass index (BMI) 35.0-35.9, adult: Secondary | ICD-10-CM | POA: Diagnosis not present

## 2018-04-17 DIAGNOSIS — M5416 Radiculopathy, lumbar region: Secondary | ICD-10-CM | POA: Diagnosis not present

## 2018-04-17 DIAGNOSIS — F419 Anxiety disorder, unspecified: Secondary | ICD-10-CM | POA: Diagnosis not present

## 2018-04-17 DIAGNOSIS — I10 Essential (primary) hypertension: Secondary | ICD-10-CM | POA: Diagnosis not present

## 2018-04-17 DIAGNOSIS — I8393 Asymptomatic varicose veins of bilateral lower extremities: Secondary | ICD-10-CM | POA: Diagnosis not present

## 2018-04-29 DIAGNOSIS — M5410 Radiculopathy, site unspecified: Secondary | ICD-10-CM | POA: Diagnosis not present

## 2018-04-29 DIAGNOSIS — M47816 Spondylosis without myelopathy or radiculopathy, lumbar region: Secondary | ICD-10-CM | POA: Diagnosis not present

## 2018-04-29 DIAGNOSIS — M5127 Other intervertebral disc displacement, lumbosacral region: Secondary | ICD-10-CM | POA: Diagnosis not present

## 2018-04-29 DIAGNOSIS — M4316 Spondylolisthesis, lumbar region: Secondary | ICD-10-CM | POA: Diagnosis not present

## 2018-04-29 DIAGNOSIS — M47817 Spondylosis without myelopathy or radiculopathy, lumbosacral region: Secondary | ICD-10-CM | POA: Diagnosis not present

## 2018-05-01 DIAGNOSIS — F419 Anxiety disorder, unspecified: Secondary | ICD-10-CM | POA: Diagnosis not present

## 2018-05-01 DIAGNOSIS — I8393 Asymptomatic varicose veins of bilateral lower extremities: Secondary | ICD-10-CM | POA: Diagnosis not present

## 2018-05-01 DIAGNOSIS — N281 Cyst of kidney, acquired: Secondary | ICD-10-CM | POA: Diagnosis not present

## 2018-05-01 DIAGNOSIS — Z299 Encounter for prophylactic measures, unspecified: Secondary | ICD-10-CM | POA: Diagnosis not present

## 2018-05-01 DIAGNOSIS — Z6834 Body mass index (BMI) 34.0-34.9, adult: Secondary | ICD-10-CM | POA: Diagnosis not present

## 2018-05-01 DIAGNOSIS — M5127 Other intervertebral disc displacement, lumbosacral region: Secondary | ICD-10-CM | POA: Diagnosis not present

## 2018-05-04 ENCOUNTER — Other Ambulatory Visit: Payer: Self-pay | Admitting: Neurological Surgery

## 2018-05-04 DIAGNOSIS — Z6834 Body mass index (BMI) 34.0-34.9, adult: Secondary | ICD-10-CM | POA: Diagnosis not present

## 2018-05-04 DIAGNOSIS — M5127 Other intervertebral disc displacement, lumbosacral region: Secondary | ICD-10-CM | POA: Diagnosis not present

## 2018-05-04 DIAGNOSIS — M4316 Spondylolisthesis, lumbar region: Secondary | ICD-10-CM | POA: Diagnosis not present

## 2018-05-15 ENCOUNTER — Encounter: Payer: BLUE CROSS/BLUE SHIELD | Admitting: Vascular Surgery

## 2018-05-15 ENCOUNTER — Encounter (HOSPITAL_COMMUNITY): Payer: BLUE CROSS/BLUE SHIELD

## 2018-05-20 ENCOUNTER — Other Ambulatory Visit: Payer: Self-pay

## 2018-05-20 ENCOUNTER — Encounter (HOSPITAL_COMMUNITY)
Admission: RE | Admit: 2018-05-20 | Discharge: 2018-05-20 | Disposition: A | Discharge status: Home / Self Care | Payer: Managed Care, Other (non HMO) | Source: Ambulatory Visit | Attending: Neurological Surgery | Admitting: Neurological Surgery

## 2018-05-20 ENCOUNTER — Ambulatory Visit (HOSPITAL_COMMUNITY)
Admission: RE | Admit: 2018-05-20 | Discharge: 2018-05-20 | Disposition: A | Discharge status: Home / Self Care | Payer: Managed Care, Other (non HMO) | Source: Ambulatory Visit | Attending: Neurological Surgery | Admitting: Neurological Surgery

## 2018-05-20 ENCOUNTER — Encounter (HOSPITAL_COMMUNITY): Payer: Self-pay | Admitting: *Deleted

## 2018-05-20 DIAGNOSIS — J9811 Atelectasis: Secondary | ICD-10-CM | POA: Diagnosis not present

## 2018-05-20 DIAGNOSIS — Z01818 Encounter for other preprocedural examination: Secondary | ICD-10-CM | POA: Diagnosis not present

## 2018-05-20 DIAGNOSIS — M4310 Spondylolisthesis, site unspecified: Secondary | ICD-10-CM | POA: Diagnosis not present

## 2018-05-20 DIAGNOSIS — R9431 Abnormal electrocardiogram [ECG] [EKG]: Secondary | ICD-10-CM | POA: Insufficient documentation

## 2018-05-20 HISTORY — DX: Unspecified osteoarthritis, unspecified site: M19.90

## 2018-05-20 HISTORY — DX: Spondylolisthesis, site unspecified: M43.10

## 2018-05-20 HISTORY — DX: Presence of spectacles and contact lenses: Z97.3

## 2018-05-20 HISTORY — DX: Presence of dental prosthetic device (complete) (partial): Z97.2

## 2018-05-20 LAB — CBC WITH DIFFERENTIAL/PLATELET
Abs Immature Granulocytes: 0 10*3/uL (ref 0.0–0.1)
BASOS ABS: 0.1 10*3/uL (ref 0.0–0.1)
Basophils Relative: 1 %
EOS PCT: 3 %
Eosinophils Absolute: 0.2 10*3/uL (ref 0.0–0.7)
HCT: 44.4 % (ref 36.0–46.0)
Hemoglobin: 13.8 g/dL (ref 12.0–15.0)
IMMATURE GRANULOCYTES: 0 %
Lymphocytes Relative: 19 %
Lymphs Abs: 1.8 10*3/uL (ref 0.7–4.0)
MCH: 27.4 pg (ref 26.0–34.0)
MCHC: 31.1 g/dL (ref 30.0–36.0)
MCV: 88.1 fL (ref 78.0–100.0)
MONO ABS: 0.7 10*3/uL (ref 0.1–1.0)
Monocytes Relative: 7 %
Neutro Abs: 6.6 10*3/uL (ref 1.7–7.7)
Neutrophils Relative %: 70 %
PLATELETS: 399 10*3/uL (ref 150–400)
RBC: 5.04 MIL/uL (ref 3.87–5.11)
RDW: 13.2 % (ref 11.5–15.5)
WBC: 9.4 10*3/uL (ref 4.0–10.5)

## 2018-05-20 LAB — PROTIME-INR
INR: 0.99
PROTHROMBIN TIME: 13 s (ref 11.4–15.2)

## 2018-05-20 LAB — TYPE AND SCREEN
ABO/RH(D): O POS
Antibody Screen: NEGATIVE

## 2018-05-20 LAB — BASIC METABOLIC PANEL
ANION GAP: 11 (ref 5–15)
BUN: 21 mg/dL (ref 8–23)
CALCIUM: 9.8 mg/dL (ref 8.9–10.3)
CO2: 26 mmol/L (ref 22–32)
Chloride: 101 mmol/L (ref 98–111)
Creatinine, Ser: 1.03 mg/dL — ABNORMAL HIGH (ref 0.44–1.00)
GFR calc Af Amer: >60 mL/min (ref >60)
GFR, EST NON AFRICAN AMERICAN: 56 mL/min — AB (ref >60)
GLUCOSE: 109 mg/dL — AB (ref 70–99)
Potassium: 3.5 mmol/L (ref 3.5–5.1)
SODIUM: 138 mmol/L (ref 135–145)

## 2018-05-20 LAB — ABO/RH: ABO/RH(D): O POS

## 2018-05-20 LAB — SURGICAL PCR SCREEN
MRSA, PCR: NEGATIVE
STAPHYLOCOCCUS AUREUS: NEGATIVE

## 2018-05-20 NOTE — Pre-Procedure Instructions (Addendum)
   Kristin Bush  05/20/2018    [id number] - [place], [place] - [address] [place] [address] Phone: [phone] Fax: [phone]  [id number] - [place], [place] - [address] [place] [address] Phone: [phone] Fax: [phone]   Your procedure is scheduled on Thursday, September 19  Report to [facility] at 5:30 A.M.  Call this number if you have problems the morning of surgery:  [phone]   Remember:  Do not eat or drink after midnight Wednesday, September 18  Take these medicines the morning of surgery with A SIP OF WATER: if needed: HYDROcodone-acetaminophen (NORCO/VICODIN) for pain Stop taking Aspirin (unless advised otherwise by your surgeon) vitamins, fish oil and herbal medications. Do not take any NSAIDs ie: Ibuprofen, Advil, Naproxen (Aleve), Motrin, diclofenac (VOLTAREN), BC and Goody Powder; stop now.   Do not wear jewelry, make-up or nail polish.  Do not wear lotions, powders, or perfumes, or deodorant.  Do not shave 48 hours prior to surgery.    Do not bring valuables to the hospital.  [facility] is not responsible for any belongings or valuables.  Contacts, dentures or bridgework may not be worn into surgery.  Leave your suitcase in the car.  After surgery it may be brought to your room. Patients discharged the day of surgery will not be allowed to drive home.  Special instructions:See " [facility]- Preparing For Surgery" sheet Please read over the following fact sheets that you were given. Pain Booklet, Coughing and Deep Breathing, MRSA Information and Surgical Site Infection Prevention

## 2018-05-20 NOTE — Progress Notes (Signed)
Pt denies SOB, chest pain, and being under the care of a cardiologist. Pt denies having a stress test, echo and cardiac cath. Pt denies having an EKG and chest x ray within the last year. Pt denies recent labs. Pt chart forwarded to anesthesia to review EKG.

## 2018-05-28 ENCOUNTER — Encounter (HOSPITAL_COMMUNITY): Payer: Self-pay

## 2018-05-28 ENCOUNTER — Inpatient Hospital Stay (HOSPITAL_COMMUNITY)
Admission: RE | Admit: 2018-05-28 | Discharge: 2018-05-29 | DRG: 455 | Disposition: A | Discharge status: Home / Self Care | Payer: Managed Care, Other (non HMO) | Attending: Neurological Surgery | Admitting: Neurological Surgery

## 2018-05-28 ENCOUNTER — Inpatient Hospital Stay (HOSPITAL_COMMUNITY): Payer: Managed Care, Other (non HMO)

## 2018-05-28 ENCOUNTER — Other Ambulatory Visit: Payer: Self-pay

## 2018-05-28 ENCOUNTER — Inpatient Hospital Stay (HOSPITAL_COMMUNITY): Payer: Managed Care, Other (non HMO) | Admitting: Physician Assistant

## 2018-05-28 ENCOUNTER — Inpatient Hospital Stay (HOSPITAL_COMMUNITY): Payer: Managed Care, Other (non HMO) | Admitting: Anesthesiology

## 2018-05-28 ENCOUNTER — Inpatient Hospital Stay (HOSPITAL_COMMUNITY)
Admission: RE | Disposition: A | Discharge status: Home / Self Care | Payer: Self-pay | Source: Home / Self Care | Attending: Neurological Surgery

## 2018-05-28 DIAGNOSIS — M48061 Spinal stenosis, lumbar region without neurogenic claudication: Principal | ICD-10-CM | POA: Diagnosis present

## 2018-05-28 DIAGNOSIS — M5127 Other intervertebral disc displacement, lumbosacral region: Secondary | ICD-10-CM | POA: Diagnosis present

## 2018-05-28 DIAGNOSIS — Z79899 Other long term (current) drug therapy: Secondary | ICD-10-CM | POA: Diagnosis not present

## 2018-05-28 DIAGNOSIS — M5126 Other intervertebral disc displacement, lumbar region: Secondary | ICD-10-CM | POA: Diagnosis present

## 2018-05-28 DIAGNOSIS — M4317 Spondylolisthesis, lumbosacral region: Secondary | ICD-10-CM | POA: Diagnosis not present

## 2018-05-28 DIAGNOSIS — Z981 Arthrodesis status: Secondary | ICD-10-CM

## 2018-05-28 DIAGNOSIS — Z90710 Acquired absence of both cervix and uterus: Secondary | ICD-10-CM | POA: Diagnosis not present

## 2018-05-28 DIAGNOSIS — Z419 Encounter for procedure for purposes other than remedying health state, unspecified: Secondary | ICD-10-CM

## 2018-05-28 DIAGNOSIS — Z791 Long term (current) use of non-steroidal anti-inflammatories (NSAID): Secondary | ICD-10-CM

## 2018-05-28 DIAGNOSIS — M5117 Intervertebral disc disorders with radiculopathy, lumbosacral region: Secondary | ICD-10-CM | POA: Diagnosis not present

## 2018-05-28 DIAGNOSIS — M4316 Spondylolisthesis, lumbar region: Secondary | ICD-10-CM | POA: Diagnosis present

## 2018-05-28 DIAGNOSIS — I10 Essential (primary) hypertension: Secondary | ICD-10-CM | POA: Diagnosis present

## 2018-05-28 DIAGNOSIS — M545 Low back pain: Secondary | ICD-10-CM | POA: Diagnosis not present

## 2018-05-28 HISTORY — PX: BACK SURGERY: SHX140

## 2018-05-28 SURGERY — POSTERIOR LUMBAR FUSION 2 LEVEL
Anesthesia: General | Site: Back

## 2018-05-28 MED ORDER — LIDOCAINE 2% (20 MG/ML) 5 ML SYRINGE
INTRAMUSCULAR | Status: AC
  Filled 2018-05-28: qty 5

## 2018-05-28 MED ORDER — ONDANSETRON HCL 4 MG/2ML IJ SOLN
INTRAMUSCULAR | Status: DC | PRN
  Administered 2018-05-28: 4 mg via INTRAVENOUS

## 2018-05-28 MED ORDER — LACTATED RINGERS IV SOLN
INTRAVENOUS | Status: DC | PRN
  Administered 2018-05-28 (×2): via INTRAVENOUS

## 2018-05-28 MED ORDER — SODIUM CHLORIDE 0.9 % IV SOLN
INTRAVENOUS | Status: DC | PRN
  Administered 2018-05-28: 09:00:00

## 2018-05-28 MED ORDER — FENTANYL CITRATE (PF) 100 MCG/2ML IJ SOLN
25.0000 ug | INTRAMUSCULAR | Status: DC | PRN
  Administered 2018-05-28 (×3): 50 ug via INTRAVENOUS

## 2018-05-28 MED ORDER — TRIAMTERENE-HCTZ 37.5-25 MG PO TABS
1.0000 | ORAL_TABLET | Freq: Every day | ORAL | Status: DC
  Administered 2018-05-29: 1 via ORAL
  Filled 2018-05-28 (×2): qty 1

## 2018-05-28 MED ORDER — THROMBIN 20000 UNITS EX SOLR
CUTANEOUS | Status: DC | PRN
  Administered 2018-05-28: 09:00:00 via TOPICAL

## 2018-05-28 MED ORDER — VANCOMYCIN HCL 1000 MG IV SOLR
INTRAVENOUS | Status: AC
Start: 2018-05-28 — End: ?
  Filled 2018-05-28: qty 1000

## 2018-05-28 MED ORDER — HYDROMORPHONE HCL 1 MG/ML IJ SOLN
INTRAMUSCULAR | Status: AC
  Filled 2018-05-28: qty 1

## 2018-05-28 MED ORDER — ONDANSETRON HCL 4 MG/2ML IJ SOLN
INTRAMUSCULAR | Status: AC
  Filled 2018-05-28: qty 2

## 2018-05-28 MED ORDER — CEFAZOLIN SODIUM-DEXTROSE 2-4 GM/100ML-% IV SOLN
2.0000 g | INTRAVENOUS | Status: AC
  Administered 2018-05-28: 2 g via INTRAVENOUS

## 2018-05-28 MED ORDER — PHENOL 1.4 % MT LIQD: 1.0000 | OROMUCOSAL | Status: DC | PRN

## 2018-05-28 MED ORDER — ONDANSETRON HCL 4 MG/2ML IJ SOLN: 4.0000 mg | Freq: Four times a day (QID) | INTRAMUSCULAR | Status: DC | PRN

## 2018-05-28 MED ORDER — THROMBIN 5000 UNITS EX SOLR
CUTANEOUS | Status: AC
  Filled 2018-05-28: qty 5000

## 2018-05-28 MED ORDER — SODIUM CHLORIDE 0.9 % IJ SOLN
INTRAMUSCULAR | Status: DC | PRN
  Administered 2018-05-28: 5 mL

## 2018-05-28 MED ORDER — CHLORHEXIDINE GLUCONATE CLOTH 2 % EX PADS: 6.0000 | MEDICATED_PAD | Freq: Once | CUTANEOUS | Status: DC

## 2018-05-28 MED ORDER — HYDROCODONE-ACETAMINOPHEN 7.5-325 MG PO TABS
ORAL_TABLET | ORAL | Status: AC
  Filled 2018-05-28: qty 1

## 2018-05-28 MED ORDER — THROMBIN (RECOMBINANT) 20000 UNITS EX SOLR
CUTANEOUS | Status: AC
Start: 2018-05-28 — End: ?
  Filled 2018-05-28: qty 20000

## 2018-05-28 MED ORDER — SODIUM CHLORIDE 0.9 % IV SOLN
250.0000 mL | INTRAVENOUS | Status: DC
  Administered 2018-05-28: 250 mL via INTRAVENOUS

## 2018-05-28 MED ORDER — ACETAMINOPHEN 10 MG/ML IV SOLN
1000.0000 mg | INTRAVENOUS | Status: AC
  Administered 2018-05-28: 1000 mg via INTRAVENOUS
  Filled 2018-05-28: qty 100

## 2018-05-28 MED ORDER — MENTHOL 3 MG MT LOZG: 1.0000 | LOZENGE | OROMUCOSAL | Status: DC | PRN

## 2018-05-28 MED ORDER — OXYCODONE HCL 5 MG/5ML PO SOLN: 5.0000 mg | Freq: Once | ORAL | Status: DC | PRN

## 2018-05-28 MED ORDER — HYDROMORPHONE HCL 1 MG/ML IJ SOLN
0.5000 mg | INTRAMUSCULAR | Status: DC | PRN
  Administered 2018-05-28: 0.5 mg via INTRAVENOUS
  Filled 2018-05-28: qty 0.5

## 2018-05-28 MED ORDER — OXYCODONE HCL 5 MG PO TABS
5.0000 mg | ORAL_TABLET | ORAL | Status: DC | PRN
  Administered 2018-05-28 (×2): 10 mg via ORAL
  Administered 2018-05-29 (×2): 5 mg via ORAL
  Filled 2018-05-28: qty 2
  Filled 2018-05-28 (×4): qty 1

## 2018-05-28 MED ORDER — HEPARIN SODIUM (PORCINE) 1000 UNIT/ML IJ SOLN
INTRAMUSCULAR | Status: DC | PRN
  Administered 2018-05-28: 5000 [IU]

## 2018-05-28 MED ORDER — FENTANYL CITRATE (PF) 100 MCG/2ML IJ SOLN
INTRAMUSCULAR | Status: AC
  Filled 2018-05-28: qty 2

## 2018-05-28 MED ORDER — THROMBIN 5000 UNITS EX SOLR
OROMUCOSAL | Status: DC | PRN
  Administered 2018-05-28: 09:00:00 via TOPICAL

## 2018-05-28 MED ORDER — CELECOXIB 200 MG PO CAPS
200.0000 mg | ORAL_CAPSULE | Freq: Two times a day (BID) | ORAL | Status: DC
  Administered 2018-05-28 – 2018-05-29 (×3): 200 mg via ORAL
  Filled 2018-05-28 (×3): qty 1

## 2018-05-28 MED ORDER — FENTANYL CITRATE (PF) 250 MCG/5ML IJ SOLN
INTRAMUSCULAR | Status: AC
  Filled 2018-05-28: qty 5

## 2018-05-28 MED ORDER — CYCLOBENZAPRINE HCL 5 MG PO TABS
5.0000 mg | ORAL_TABLET | Freq: Three times a day (TID) | ORAL | Status: DC | PRN
  Administered 2018-05-28 – 2018-05-29 (×2): 5 mg via ORAL
  Filled 2018-05-28 (×2): qty 1

## 2018-05-28 MED ORDER — SODIUM CHLORIDE 0.9% FLUSH: 3.0000 mL | INTRAVENOUS | Status: DC | PRN

## 2018-05-28 MED ORDER — PROPOFOL 10 MG/ML IV BOLUS
INTRAVENOUS | Status: DC | PRN
  Administered 2018-05-28: 50 mg via INTRAVENOUS
  Administered 2018-05-28: 150 mg via INTRAVENOUS

## 2018-05-28 MED ORDER — POTASSIUM CHLORIDE IN NACL 20-0.9 MEQ/L-% IV SOLN: INTRAVENOUS | Status: DC

## 2018-05-28 MED ORDER — SODIUM CHLORIDE 0.9 % IV SOLN: 250.0000 mL | INTRAVENOUS | Status: DC

## 2018-05-28 MED ORDER — ACETAMINOPHEN 650 MG RE SUPP: 650.0000 mg | RECTAL | Status: DC | PRN

## 2018-05-28 MED ORDER — MIDAZOLAM HCL 5 MG/5ML IJ SOLN
INTRAMUSCULAR | Status: DC | PRN
  Administered 2018-05-28: 2 mg via INTRAVENOUS

## 2018-05-28 MED ORDER — LACTATED RINGERS IV SOLN
INTRAVENOUS | Status: DC | PRN
  Administered 2018-05-28 (×2): via INTRAVENOUS

## 2018-05-28 MED ORDER — ROCURONIUM BROMIDE 10 MG/ML (PF) SYRINGE
PREFILLED_SYRINGE | INTRAVENOUS | Status: DC | PRN
  Administered 2018-05-28: 50 mg via INTRAVENOUS

## 2018-05-28 MED ORDER — HEPARIN SODIUM (PORCINE) 1000 UNIT/ML IJ SOLN
INTRAMUSCULAR | Status: AC
  Filled 2018-05-28: qty 1

## 2018-05-28 MED ORDER — NON FORMULARY
Status: DC | PRN
  Administered 2018-05-28: 10 mL

## 2018-05-28 MED ORDER — SODIUM CHLORIDE 0.9% FLUSH
3.0000 mL | Freq: Two times a day (BID) | INTRAVENOUS | Status: DC
  Administered 2018-05-28 (×2): 3 mL via INTRAVENOUS

## 2018-05-28 MED ORDER — SODIUM CHLORIDE 0.9 % IV SOLN
INTRAVENOUS | Status: DC | PRN
  Administered 2018-05-28: 25 ug/min via INTRAVENOUS

## 2018-05-28 MED ORDER — BUPIVACAINE HCL (PF) 0.25 % IJ SOLN
INTRAMUSCULAR | Status: DC | PRN
  Administered 2018-05-28: 5 mL

## 2018-05-28 MED ORDER — PROPOFOL 10 MG/ML IV BOLUS
INTRAVENOUS | Status: AC
  Filled 2018-05-28: qty 20

## 2018-05-28 MED ORDER — DEXAMETHASONE SODIUM PHOSPHATE 10 MG/ML IJ SOLN
INTRAMUSCULAR | Status: AC
  Filled 2018-05-28: qty 1

## 2018-05-28 MED ORDER — MIDAZOLAM HCL 2 MG/2ML IJ SOLN
INTRAMUSCULAR | Status: AC
  Filled 2018-05-28: qty 2

## 2018-05-28 MED ORDER — CEFAZOLIN SODIUM-DEXTROSE 2-4 GM/100ML-% IV SOLN
INTRAVENOUS | Status: AC
  Filled 2018-05-28: qty 100

## 2018-05-28 MED ORDER — SUGAMMADEX SODIUM 200 MG/2ML IV SOLN
INTRAVENOUS | Status: DC | PRN
  Administered 2018-05-28: 180 mg via INTRAVENOUS

## 2018-05-28 MED ORDER — OXYCODONE HCL 5 MG PO TABS: 5.0000 mg | ORAL_TABLET | Freq: Once | ORAL | Status: DC | PRN

## 2018-05-28 MED ORDER — 0.9 % SODIUM CHLORIDE (POUR BTL) OPTIME
TOPICAL | Status: DC | PRN
  Administered 2018-05-28: 1000 mL

## 2018-05-28 MED ORDER — VANCOMYCIN HCL 1000 MG IV SOLR
INTRAVENOUS | Status: DC | PRN
  Administered 2018-05-28: 1000 mg via TOPICAL

## 2018-05-28 MED ORDER — LIDOCAINE 2% (20 MG/ML) 5 ML SYRINGE
INTRAMUSCULAR | Status: DC | PRN
  Administered 2018-05-28: 50 mg via INTRAVENOUS

## 2018-05-28 MED ORDER — HYDROCODONE-ACETAMINOPHEN 7.5-325 MG PO TABS
1.0000 | ORAL_TABLET | ORAL | Status: DC | PRN
  Administered 2018-05-28: 1 via ORAL

## 2018-05-28 MED ORDER — CEFAZOLIN SODIUM-DEXTROSE 2-4 GM/100ML-% IV SOLN
2.0000 g | Freq: Three times a day (TID) | INTRAVENOUS | Status: AC
  Administered 2018-05-28 (×2): 2 g via INTRAVENOUS
  Filled 2018-05-28 (×2): qty 100

## 2018-05-28 MED ORDER — SENNA 8.6 MG PO TABS
1.0000 | ORAL_TABLET | Freq: Two times a day (BID) | ORAL | Status: DC
  Administered 2018-05-28 – 2018-05-29 (×2): 8.6 mg via ORAL
  Filled 2018-05-28 (×2): qty 1

## 2018-05-28 MED ORDER — PHENYLEPHRINE 40 MCG/ML (10ML) SYRINGE FOR IV PUSH (FOR BLOOD PRESSURE SUPPORT)
PREFILLED_SYRINGE | INTRAVENOUS | Status: DC | PRN
  Administered 2018-05-28: 120 ug via INTRAVENOUS
  Administered 2018-05-28 (×2): 80 ug via INTRAVENOUS
  Administered 2018-05-28: 120 ug via INTRAVENOUS

## 2018-05-28 MED ORDER — ONDANSETRON HCL 4 MG PO TABS: 4.0000 mg | ORAL_TABLET | Freq: Four times a day (QID) | ORAL | Status: DC | PRN

## 2018-05-28 MED ORDER — DEXAMETHASONE SODIUM PHOSPHATE 10 MG/ML IJ SOLN
10.0000 mg | INTRAMUSCULAR | Status: AC
  Administered 2018-05-28: 10 mg via INTRAVENOUS

## 2018-05-28 MED ORDER — ROCURONIUM BROMIDE 50 MG/5ML IV SOSY
PREFILLED_SYRINGE | INTRAVENOUS | Status: AC
  Filled 2018-05-28: qty 5

## 2018-05-28 MED ORDER — BUPIVACAINE HCL (PF) 0.25 % IJ SOLN
INTRAMUSCULAR | Status: AC
  Filled 2018-05-28: qty 30

## 2018-05-28 MED ORDER — FENTANYL CITRATE (PF) 100 MCG/2ML IJ SOLN
INTRAMUSCULAR | Status: DC | PRN
  Administered 2018-05-28: 150 ug via INTRAVENOUS
  Administered 2018-05-28 (×2): 50 ug via INTRAVENOUS

## 2018-05-28 MED ORDER — PHENYLEPHRINE 40 MCG/ML (10ML) SYRINGE FOR IV PUSH (FOR BLOOD PRESSURE SUPPORT)
PREFILLED_SYRINGE | INTRAVENOUS | Status: AC
  Filled 2018-05-28: qty 10

## 2018-05-28 MED ORDER — ACETAMINOPHEN 325 MG PO TABS
650.0000 mg | ORAL_TABLET | ORAL | Status: DC | PRN
  Administered 2018-05-29 (×2): 650 mg via ORAL
  Filled 2018-05-28 (×2): qty 2

## 2018-05-28 MED ORDER — HYDROMORPHONE HCL 1 MG/ML IJ SOLN
0.2500 mg | INTRAMUSCULAR | Status: DC | PRN
  Administered 2018-05-28 (×3): 0.5 mg via INTRAVENOUS

## 2018-05-28 SURGICAL SUPPLY — 67 items
BAG DECANTER FOR FLEXI CONT (MISCELLANEOUS) ×3 IMPLANT
BASKET BONE COLLECTION (BASKET) ×3 IMPLANT
BENZOIN TINCTURE PRP APPL 2/3 (GAUZE/BANDAGES/DRESSINGS) ×3 IMPLANT
BLADE CLIPPER SURG (BLADE) IMPLANT
BUR MATCHSTICK NEURO 3.0 LAGG (BURR) ×3 IMPLANT
CANISTER SUCT 3000ML PPV (MISCELLANEOUS) ×3 IMPLANT
CARTRIDGE OIL MAESTRO DRILL (MISCELLANEOUS) ×1 IMPLANT
CLOSURE WOUND 1/2 X4 (GAUZE/BANDAGES/DRESSINGS) ×1
CONT SPEC 4OZ CLIKSEAL STRL BL (MISCELLANEOUS) ×6 IMPLANT
COVER BACK TABLE 60X90IN (DRAPES) ×3 IMPLANT
DERMABOND ADVANCED (GAUZE/BANDAGES/DRESSINGS) ×2
DERMABOND ADVANCED .7 DNX12 (GAUZE/BANDAGES/DRESSINGS) ×1 IMPLANT
DIFFUSER DRILL AIR PNEUMATIC (MISCELLANEOUS) ×3 IMPLANT
DRAPE C-ARM 42X72 X-RAY (DRAPES) ×3 IMPLANT
DRAPE C-ARMOR (DRAPES) ×3 IMPLANT
DRAPE LAPAROTOMY 100X72X124 (DRAPES) ×3 IMPLANT
DRAPE POUCH INSTRU U-SHP 10X18 (DRAPES) ×3 IMPLANT
DRAPE SURG 17X23 STRL (DRAPES) ×3 IMPLANT
DRSG OPSITE POSTOP 4X6 (GAUZE/BANDAGES/DRESSINGS) ×3 IMPLANT
DURAPREP 26ML APPLICATOR (WOUND CARE) ×3 IMPLANT
ELECT REM PT RETURN 9FT ADLT (ELECTROSURGICAL) ×3
ELECTRODE REM PT RTRN 9FT ADLT (ELECTROSURGICAL) ×1 IMPLANT
EVACUATOR 1/8 PVC DRAIN (DRAIN) ×3 IMPLANT
GAUZE 4X4 16PLY RFD (DISPOSABLE) IMPLANT
GLOVE BIO SURGEON STRL SZ7 (GLOVE) IMPLANT
GLOVE BIO SURGEON STRL SZ8 (GLOVE) ×6 IMPLANT
GLOVE BIOGEL PI IND STRL 7.0 (GLOVE) IMPLANT
GLOVE BIOGEL PI INDICATOR 7.0 (GLOVE)
GOWN STRL REUS W/ TWL LRG LVL3 (GOWN DISPOSABLE) IMPLANT
GOWN STRL REUS W/ TWL XL LVL3 (GOWN DISPOSABLE) ×2 IMPLANT
GOWN STRL REUS W/TWL 2XL LVL3 (GOWN DISPOSABLE) IMPLANT
GOWN STRL REUS W/TWL LRG LVL3 (GOWN DISPOSABLE)
GOWN STRL REUS W/TWL XL LVL3 (GOWN DISPOSABLE) ×4
HEMOSTAT POWDER KIT SURGIFOAM (HEMOSTASIS) IMPLANT
KIT BASIN OR (CUSTOM PROCEDURE TRAY) ×3 IMPLANT
KIT BONE MRW ASP ANGEL CPRP (KITS) ×3 IMPLANT
KIT TURNOVER KIT B (KITS) ×3 IMPLANT
MATRIX STRIP NEOCORE 12C (Putty) ×1 IMPLANT
MILL MEDIUM DISP (BLADE) ×3 IMPLANT
NEEDLE HYPO 18GX1.5 BLUNT FILL (NEEDLE) ×3 IMPLANT
NEEDLE HYPO 25X1 1.5 SAFETY (NEEDLE) ×3 IMPLANT
NS IRRIG 1000ML POUR BTL (IV SOLUTION) ×3 IMPLANT
OIL CARTRIDGE MAESTRO DRILL (MISCELLANEOUS) ×3
PACK LAMINECTOMY NEURO (CUSTOM PROCEDURE TRAY) ×3 IMPLANT
PAD ARMBOARD 7.5X6 YLW CONV (MISCELLANEOUS) ×9 IMPLANT
PUTTY DBM ALLOSYNC PURE 10CC (Putty) ×3 IMPLANT
ROD PC 5.5X55 TI ARSENAL (Rod) ×6 IMPLANT
SCREW CBX 6.5X35MM (Screw) ×3 IMPLANT
SCREW CBX 6.5X40MM (Screw) ×3 IMPLANT
SCREW CORT CBX 5.5X40 (Screw) ×12 IMPLANT
SCREW SET SPINAL ARSENAL 47127 (Screw) ×18 IMPLANT
SPACER IDENTITI PS 9X9X25 15D (Spacer) ×6 IMPLANT
SPACER PS POROUS 8X9X25 10D (Spacer) ×6 IMPLANT
SPONGE LAP 4X18 RFD (DISPOSABLE) IMPLANT
SPONGE SURGIFOAM ABS GEL 100 (HEMOSTASIS) ×3 IMPLANT
STRIP CLOSURE SKIN 1/2X4 (GAUZE/BANDAGES/DRESSINGS) ×2 IMPLANT
STRIP MATRIX NEOCORE 12CC (Putty) ×2 IMPLANT
SUT BONE WAX W31G (SUTURE) ×3 IMPLANT
SUT VIC AB 0 CT1 18XCR BRD8 (SUTURE) ×1 IMPLANT
SUT VIC AB 0 CT1 8-18 (SUTURE) ×2
SUT VIC AB 2-0 CP2 18 (SUTURE) ×3 IMPLANT
SUT VIC AB 3-0 SH 8-18 (SUTURE) ×6 IMPLANT
SYR CONTROL 10ML LL (SYRINGE) ×3 IMPLANT
TOWEL GREEN STERILE (TOWEL DISPOSABLE) ×3 IMPLANT
TOWEL GREEN STERILE FF (TOWEL DISPOSABLE) ×3 IMPLANT
TRAY FOLEY MTR SLVR 16FR STAT (SET/KITS/TRAYS/PACK) ×3 IMPLANT
WATER STERILE IRR 1000ML POUR (IV SOLUTION) ×3 IMPLANT

## 2018-05-28 NOTE — H&P (Signed)
Subjective: Patient is a 67 y.o. female admitted for back and leg pain. Onset of symptoms was several months ago, gradually worsening since that time.  The pain is rated severe, and is located at the across the lower back and radiates to LLE. The pain is described as aching and occurs all day. The symptoms have been progressive. Symptoms are exacerbated by exercise. MRI or CT showed spondylolisthesis L4-5, large HNP l5-S1   Past Medical History:  Diagnosis Date  . Arthritis   . Depression   . Hypertension   . Spondylolisthesis   . Wears glasses   . Wears partial dentures     Past Surgical History:  Procedure Laterality Date  . ABDOMINAL HYSTERECTOMY    . CATARACT EXTRACTION W/PHACO Left 10/06/2014   Procedure: CATARACT EXTRACTION PHACO AND INTRAOCULAR LENS PLACEMENT LEFT EYE;  Surgeon: Gemma Payor, MD;  Location: [facility];  Service: Ophthalmology;  Laterality: Left;  CDE:5.49  . CATARACT EXTRACTION W/PHACO Right 10/17/2014   Procedure: CATARACT EXTRACTION PHACO AND INTRAOCULAR LENS PLACEMENT RIGHT EYE CDE=9.81;  Surgeon: Gemma Payor, MD;  Location: [facility];  Service: Ophthalmology;  Laterality: Right;  . MULTIPLE TOOTH EXTRACTIONS    . NO PAST SURGERIES    . ROTATOR CUFF REPAIR     right shoulder  . TUBAL LIGATION      Prior to Admission medications   Medication Sig Start Date End Date Taking? Authorizing Provider  cyclobenzaprine (FLEXERIL) 5 MG tablet Take 5 mg by mouth 2 (two) times daily. 05/01/18  Yes [provider]  diclofenac (VOLTAREN) 75 MG EC tablet Take 75 mg by mouth 2 (two) times daily.    Yes [provider]  HYDROcodone-acetaminophen (NORCO/VICODIN) 5-325 MG tablet Take 1 tablet by mouth every 6 (six) hours as needed for moderate pain. 04/15/18  Yes Lawyer, Cristal Deer, PA-C  metroNIDAZOLE (METROCREAM) 0.75 % cream Apply 1 application topically daily as needed. rosacea 05/01/18  Yes [provider]  triamterene-hydrochlorothiazide (MAXZIDE-25) 37.5-25  MG per tablet Take 1 tablet by mouth daily.   Yes [provider]  cyclobenzaprine (FLEXERIL) 10 MG tablet Take 1 tablet (10 mg total) by mouth at bedtime. Patient not taking: Reported on 05/15/2018 04/15/18   Charlestine Night, PA-C  predniSONE (DELTASONE) 50 MG tablet Take 1 tablet (50 mg total) by mouth daily with breakfast. Patient not taking: Reported on 05/15/2018 04/15/18   Charlestine Night, PA-C   No Known Allergies  Social History   Tobacco Use  . Smoking status: Never Smoker  . Smokeless tobacco: Never Used  Substance Use Topics  . Alcohol use: No    History reviewed. No pertinent family history.   Review of Systems  Positive ROS: neg  All other systems have been reviewed and were otherwise negative with the exception of those mentioned in the HPI and as above.  Objective: Vital signs in last 24 hours: Temp:  [98.7 F (37.1 C)] 98.7 F (37.1 C) (09/19 1610) Pulse Rate:  [79] 79 (09/19 0608) Resp:  [20] 20 (09/19 9604) BP: (134)/(69) 134/69 (09/19 0608) SpO2:  [97 %] 97 % (09/19 0608) Weight:  [83 kg] 83 kg (09/19 0624)  General Appearance: Alert, cooperative, no distress, appears stated age Head: Normocephalic, without obvious abnormality, atraumatic Eyes: PERRL, conjunctiva/corneas clear, EOM's intact    Neck: Supple, symmetrical, trachea midline Back: Symmetric, no curvature, ROM normal, no CVA tenderness Lungs:  respirations unlabored Heart: Regular rate and rhythm Abdomen: Soft, non-tender Extremities: Extremities normal, atraumatic, no cyanosis or edema Pulses: 2+  and symmetric all extremities Skin: Skin color, texture, turgor normal, no rashes or lesions  NEUROLOGIC:   Mental status: Alert and oriented x4,  no aphasia, good attention span, fund of knowledge, and memory Motor Exam - grossly normal Sensory Exam - grossly normal Reflexes: trace Coordination - grossly normal Gait - grossly normal Balance - grossly normal Cranial Nerves: I:  smell Not tested  II: visual acuity  OS: nl    OD: nl  II: visual fields Full to confrontation  II: pupils Equal, round, reactive to light  III,VII: ptosis None  III,IV,VI: extraocular muscles  Full ROM  V: mastication Normal  V: facial light touch sensation  Normal  V,VII: corneal reflex  Present  VII: facial muscle function - upper  Normal  VII: facial muscle function - lower Normal  VIII: hearing Not tested  IX: soft palate elevation  Normal  IX,X: gag reflex Present  XI: trapezius strength  5/5  XI: sternocleidomastoid strength 5/5  XI: neck flexion strength  5/5  XII: tongue strength  Normal    Data Review Lab Results  Component Value Date   WBC 9.4 05/20/2018   HGB 13.8 05/20/2018   HCT 44.4 05/20/2018   MCV 88.1 05/20/2018   PLT 399 05/20/2018   Lab Results  Component Value Date   NA 138 05/20/2018   K 3.5 05/20/2018   CL 101 05/20/2018   CO2 26 05/20/2018   BUN 21 05/20/2018   CREATININE 1.03 (H) 05/20/2018   GLUCOSE 109 (H) 05/20/2018   Lab Results  Component Value Date   INR 0.99 05/20/2018    Assessment/Plan:  Estimated body mass index is 33.48 kg/m as calculated from the following:   Height as of this encounter: 5\' 2"  (1.575 m).   Weight as of this encounter: 83 kg. Patient admitted for PLIF L4-5 L5-s1. Patient has failed a reasonable attempt at conservative therapy.  I explained the condition and procedure to the patient and answered any questions.  Patient wishes to proceed with procedure as planned. Understands risks/ benefits and typical outcomes of procedure.   JONES,DAVID S 05/28/2018 7:34 AM

## 2018-05-28 NOTE — Progress Notes (Signed)
Pt c/o 9/10 pain after 150mcg Fentanyl & po Norco. Dr Chaney MallingHodierne here & updated-new order for IV Dilaudid. Will cont to monitor.

## 2018-05-28 NOTE — Transfer of Care (Signed)
Immediate Anesthesia Transfer of Care Note  Patient: Bill SalinasBarbara S Cullifer  Procedure(s) Performed: Posterior Lumbar Interbody Fusion - Lumbar Four-Lumbar Five - Lumbar Five-Sacral One (N/A Back)  Patient Location: PACU  Anesthesia Type:General  Level of Consciousness: awake and alert   Airway & Oxygen Therapy: Patient Spontanous Breathing and Patient connected to face mask oxygen  Post-op Assessment: Report given to RN and Post -op Vital signs reviewed and stable  Post vital signs: Reviewed and stable  Last Vitals:  Vitals Value Taken Time  BP 113/77 05/28/2018 11:34 AM  Temp    Pulse 86 05/28/2018 11:38 AM  Resp 31 05/28/2018 11:38 AM  SpO2 99 % 05/28/2018 11:38 AM  Vitals shown include unvalidated device data.  Last Pain:  Vitals:   05/28/18 0600  PainSc: 8          Complications: No apparent anesthesia complications

## 2018-05-28 NOTE — Anesthesia Procedure Notes (Signed)
Procedure Name: Intubation Date/Time: 05/28/2018 7:48 AM Performed by: Lovie Cholock, Baylynn Shifflett K, CRNA Pre-anesthesia Checklist: Patient identified, Emergency Drugs available, Suction available and Patient being monitored Patient Re-evaluated:Patient Re-evaluated prior to induction Oxygen Delivery Method: Circle System Utilized Preoxygenation: Pre-oxygenation with 100% oxygen Induction Type: IV induction Ventilation: Mask ventilation without difficulty Laryngoscope Size: Miller and 2 Grade View: Grade II Tube type: Oral Tube size: 7.5 mm Number of attempts: 1 Airway Equipment and Method: Stylet Placement Confirmation: ETT inserted through vocal cords under direct vision,  positive ETCO2 and breath sounds checked- equal and bilateral Secured at: 22 cm Tube secured with: Tape Dental Injury: Teeth and Oropharynx as per pre-operative assessment

## 2018-05-28 NOTE — Progress Notes (Signed)
Patient ID: Kristin SalinasBarbara S Bush, female   DOB: 02/04/1951, 67 y.o.   MRN: [id number] Looks great post-op, oob, no leg pain, good strength, looks very comfortable

## 2018-05-28 NOTE — Anesthesia Preprocedure Evaluation (Addendum)
Anesthesia Evaluation  Patient identified by MRN, date of birth, ID band Patient awake    Reviewed: Allergy & Precautions, H&P , NPO status , Patient's Chart, lab work & pertinent test results  Airway Mallampati: II  TM Distance: <3 FB Neck ROM: Limited    Dental  (+) Partial Upper, Dental Advisory Given   Pulmonary neg pulmonary ROS,    breath sounds clear to auscultation       Cardiovascular hypertension, Pt. on medications  Rhythm:regular Rate:Normal     Neuro/Psych    GI/Hepatic   Endo/Other  Morbid obesityobese  Renal/GU      Musculoskeletal  (+) Arthritis ,   Abdominal   Peds  Hematology   Anesthesia Other Findings   Reproductive/Obstetrics                            Anesthesia Physical Anesthesia Plan  ASA: II  Anesthesia Plan: General   Post-op Pain Management:    Induction: Intravenous  PONV Risk Score and Plan: 3 and Ondansetron, Dexamethasone, Midazolam and Treatment may vary due to age or medical condition  Airway Management Planned: Oral ETT  Additional Equipment:   Intra-op Plan:   Post-operative Plan: Extubation in OR  Informed Consent: I have reviewed the patients History and Physical, chart, labs and discussed the procedure including the risks, benefits and alternatives for the proposed anesthesia with the patient or authorized representative who has indicated his/her understanding and acceptance.     Plan Discussed with: CRNA, Anesthesiologist and Surgeon  Anesthesia Plan Comments:         Anesthesia Quick Evaluation

## 2018-05-28 NOTE — Anesthesia Postprocedure Evaluation (Signed)
Anesthesia Post Note  Patient: Kristin SalinasBarbara S Bush  Procedure(s) Performed: Posterior Lumbar Interbody Fusion - Lumbar Four-Lumbar Five - Lumbar Five-Sacral One (N/A Back)     Patient location during evaluation: PACU Anesthesia Type: General Level of consciousness: awake and alert Pain management: pain level controlled Vital Signs Assessment: post-procedure vital signs reviewed and stable Respiratory status: spontaneous breathing, nonlabored ventilation, respiratory function stable and patient connected to nasal cannula oxygen Cardiovascular status: blood pressure returned to baseline and stable Postop Assessment: no apparent nausea or vomiting Anesthetic complications: no    Last Vitals:  Vitals:   05/28/18 1315 05/28/18 1342  BP:  137/83  Pulse:  94  Resp:  18  Temp: (!) 36.4 C 36.8 C  SpO2:  95%    Last Pain:  Vitals:   05/28/18 1342  TempSrc: Oral  PainSc:                  [name]

## 2018-05-28 NOTE — Op Note (Signed)
05/28/2018  11:45 AM  PATIENT:  Kristin PainBarbara S Fonda  67 y.o. female  PRE-OPERATIVE DIAGNOSIS: Grade 1 spondylolisthesis L4-5, spinal stenosis L4-5, large left-sided free fragment under the L5 nerve root L5-S1 left, diastases of the facets L5-S1, back and left leg Bush  POST-OPERATIVE DIAGNOSIS:  same  PROCEDURE:   1. Decompressive lumbar laminectomy L4-5 and L5-S1 requiring more work than would be required for a simple exposure of the disk for PLIF in order to adequately decompress the neural elements and address the spinal stenosis 2. Posterior lumbar interbody fusion L4 5 and L5-S1 using porous titanium interbody cages packed with morcellized allograft and autograft soaked with a bone marrow aspirate obtained through a separate fascial incision over the right iliac crest 3. Posterior fixation L4-S1 inclusive using Alphatec cortical pedicle screws.  4. Intertransverse arthrodesis L4-S1 using morcellized autograft and allograft.  SURGEON:  Marikay Alaravid Jones, MD  ASSISTANTS: Dr Conchita ParisNundkumar  ANESTHESIA:  General  EBL: 250 ml  Total I/O In: 2000 [I.V.:2000] Out: 425 [Urine:175; Blood:250]  BLOOD ADMINISTERED:none  DRAINS: none   INDICATION FOR PROCEDURE: This patient presented with severe back and left leg Bush. Imaging revealed spondylolisthesis with stenosis L4-5 and lumbar disc herniation L5-S1 with diastases of the facets. The patient tried a reasonable attempt at conservative medical measures without relief. I recommended decompression and instrumented fusion to address the stenosis as well as the segmental  instability.  Patient understood the risks, benefits, and alternatives and potential outcomes and wished to proceed.  PROCEDURE DETAILS:  The patient was brought to the operating room. After induction of generalized endotracheal anesthesia the patient was rolled into the prone position on chest rolls and all pressure points were padded. The patient's lumbar region was cleaned and then  prepped with DuraPrep and draped in the usual sterile fashion. Anesthesia was injected and then a dorsal midline incision was made and carried down to the lumbosacral fascia. The fascia was opened and the paraspinous musculature was taken down in a subperiosteal fashion to expose L4-5 and L5-S1. A self-retaining retractor was placed. Intraoperative fluoroscopy confirmed my level, and I started with placement of the L4 cortical pedicle screws. The pedicle screw entry zones were identified utilizing surface landmarks and  AP and lateral fluoroscopy. I scored the cortex with the high-speed drill and then used the hand drill to drill an upward and outward direction into the pedicle. I then tapped line to line. I then placed a 5.5 x 40 mm cortical pedicle screw into the pedicles of L4 bilaterally.  I then dissected in a suprafascial plane to expose the right iliac crest.  The fascia was opened and a Jamshidi needle was used to extract 60 cc of bone marrow aspirate from the right iliac crest.  This was spun down with a Ehinger device and soaked on allograft for later arthrodesis.  The fascia was closed.  Retractor was then replaced.  I then turned my attention to the decompression and complete lumbar laminectomies, hemi- facetectomies, and foraminotomies were performed at L4-5 and L5-S1. The patient had significant spinal stenosis and this required more work than would be required for a simple exposure of the disc for posterior lumbar interbody fusion which would only require a limited laminotomy. Much more generous decompression and generous foraminotomy was undertaken in order to adequately decompress the neural elements and address the patient's leg Bush. The yellow ligament was removed to expose the underlying dura and nerve roots, and generous foraminotomies were performed to adequately decompress the neural elements.  Both the exiting and traversing nerve roots were decompressed on both sides until a coronary dilator  passed easily along the nerve roots.  There is a large free fragment underneath the L5 nerve root and multiple fragments were removed with a nerve hook and pituitary rongeur.  Once the decompression was complete, I turned my attention to the posterior lower lumbar interbody fusion. The epidural venous vasculature was coagulated and cut sharply. Disc space was incised and the initial discectomy was performed with pituitary rongeurs. The disc space was distracted with sequential distractors to a height of 9 mm. We then used a series of scrapers and shavers to prepare the endplates for fusion. The midline was prepared with Epstein curettes. Once the complete discectomy was finished, we packed an appropriate sized interbody cage with local autograft and morcellized allograft, gently retracted the nerve root, and tapped the cage into position at L4-5 and L5-S1.  The midline between the cages was packed with morselized autograft and allograft. We then turned our attention to the placement of the lower pedicle screws. The pedicle screw entry zones were identified utilizing surface landmarks and fluoroscopy. I drilled into each pedicle utilizing the hand drill, and tapped each pedicle with the appropriate tap. We palpated with a ball probe to assure no break in the cortex. We then placed 5.5 x 40 mm pedicle screws into the pedicles bilaterally at L5 and 6.5 x 40 mm pedicle screws into the sacrum.. We then decorticated the transverse processes and laid a mixture of morcellized autograft and allograft out over these to perform intertransverse arthrodesis at L4 5 and L5-S1. We then placed lordotic rods into the multiaxial screw heads of the pedicle screws and locked these in position with the locking caps and anti-torque device. We then checked our construct with AP and lateral fluoroscopy. Irrigated with copious amounts of bacitracin-containing saline solution. Inspected the nerve roots once again to assure adequate  decompression, lined to the dura with Gelfoam, placed powdered vancomycin into the wound, and closed the muscle and the fascia with 0 Vicryl. Closed the subcutaneous tissues with 2-0 Vicryl and subcuticular tissues with 3-0 Vicryl. The skin was closed with benzoin and Steri-Strips. Dressing was then applied, the patient was awakened from general anesthesia and transported to the recovery room in stable condition. At the end of the procedure all sponge, needle and instrument counts were correct.   PLAN OF CARE: admit to inpatient  PATIENT DISPOSITION:  PACU - hemodynamically stable.   Delay start of Pharmacological VTE agent (>24hrs) due to surgical blood loss or risk of bleeding:  yes

## 2018-05-29 MED ORDER — OXYCODONE HCL 5 MG PO TABS: 5.0000 mg | ORAL_TABLET | ORAL | 0 refills | Status: DC | PRN

## 2018-05-29 MED ORDER — CYCLOBENZAPRINE HCL 5 MG PO TABS: 5.0000 mg | ORAL_TABLET | Freq: Three times a day (TID) | ORAL | 1 refills | Status: DC | PRN

## 2018-05-29 MED FILL — Thrombin (Recombinant) For Soln 20000 Unit: CUTANEOUS | Qty: 1 | Status: AC

## 2018-05-29 NOTE — Discharge Summary (Signed)
Physician Discharge Summary  Patient ID: ILO BEAMON MRN: [id number] DOB/AGE: 11/08/1950 67 y.o.  Admit date: 05/28/2018 Discharge date: 05/29/2018  Admission Diagnoses: stenosis/ spondylolisthesis    Discharge Diagnoses: same   Discharged Condition: good  Hospital Course: The patient was admitted on 05/28/2018 and taken to the operating room where the patient underwent PLIF. The patient tolerated the procedure well and was taken to the recovery room and then to the floor in stable condition. The hospital course was routine. There were no complications. The wound remained clean dry and intact. Pt had appropriate back soreness. No complaints of leg pain or new N/T/W. The patient remained afebrile with stable vital signs, and tolerated a regular diet. The patient continued to increase activities, and pain was well controlled with oral pain medications.   Consults: None  Significant Diagnostic Studies:  Results for orders placed or performed during the hospital encounter of 05/20/18  Surgical pcr screen  Result Value Ref Range   MRSA, PCR NEGATIVE NEGATIVE   Staphylococcus aureus NEGATIVE NEGATIVE  Basic metabolic panel  Result Value Ref Range   Sodium 138 135 - 145 mmol/L   Potassium 3.5 3.5 - 5.1 mmol/L   Chloride 101 98 - 111 mmol/L   CO2 26 22 - 32 mmol/L   Glucose, Bld 109 (H) 70 - 99 mg/dL   BUN 21 8 - 23 mg/dL   Creatinine, Ser 7.82 (H) 0.44 - 1.00 mg/dL   Calcium 9.8 8.9 - 95.6 mg/dL   GFR calc non Af Amer 56 (L) >60 mL/min   GFR calc Af Amer >60 >60 mL/min   Anion gap 11 5 - 15  CBC WITH DIFFERENTIAL  Result Value Ref Range   WBC 9.4 4.0 - 10.5 K/uL   RBC 5.04 3.87 - 5.11 MIL/uL   Hemoglobin 13.8 12.0 - 15.0 g/dL   HCT 21.3 08.6 - 57.8 %   MCV 88.1 78.0 - 100.0 fL   MCH 27.4 26.0 - 34.0 pg   MCHC 31.1 30.0 - 36.0 g/dL   RDW 46.9 62.9 - 52.8 %   Platelets 399 150 - 400 K/uL   Neutrophils Relative % 70 %   Neutro Abs 6.6 1.7 - 7.7 K/uL   Lymphocytes Relative  19 %   Lymphs Abs 1.8 0.7 - 4.0 K/uL   Monocytes Relative 7 %   Monocytes Absolute 0.7 0.1 - 1.0 K/uL   Eosinophils Relative 3 %   Eosinophils Absolute 0.2 0.0 - 0.7 K/uL   Basophils Relative 1 %   Basophils Absolute 0.1 0.0 - 0.1 K/uL   Immature Granulocytes 0 %   Abs Immature Granulocytes 0.0 0.0 - 0.1 K/uL  Protime-INR  Result Value Ref Range   Prothrombin Time 13.0 11.4 - 15.2 seconds   INR 0.99   Type and screen [facility]  Result Value Ref Range   ABO/RH(D) O POS    Antibody Screen NEG    Sample Expiration 06/03/2018    Extend sample reason      NO TRANSFUSIONS OR PREGNANCY IN THE PAST 3 MONTHS Performed at [facility], [address], [place], [place] [address]   ABO/Rh  Result Value Ref Range   ABO/RH(D)      O POS Performed at [facility], [address] [place], [place] [address]     Chest 2 View  Result Date: 05/21/2018 CLINICAL DATA:  Preop lumbar surgery EXAM: CHEST - 2 VIEW COMPARISON:  04/07/2014 FINDINGS: Low lung  volumes, bibasilar atelectasis. Peribronchial thickening. Heart is normal size. No effusions or acute bony abnormality. IMPRESSION: Bronchitic changes, low volumes with bibasilar atelectasis. Electronically Signed   By: Charlett NoseKevin  Dover M.D.   On: 05/21/2018 08:53   Dg Lumbar Spine 2-3 Views  Result Date: 05/28/2018 CLINICAL DATA:  67 year old female with lumbar spine surgery EXAM: DG C-ARM 61-120 MIN; LUMBAR SPINE - 2-3 VIEW COMPARISON:  04/15/2018 FINDINGS: Intraoperative fluoroscopic spot images of posterior lumbar interbody fusion with bilateral pedicle screw and rod fixation spanning L4-S1. Interbody spacers in place. Anterior view demonstrates interbody spacers are centered at the L4-L5 and L5-S1 spaces. IMPRESSION: Limited intraoperative lumbar spine fluoroscopic images of L4-S1 posterior lumbar interbody fusion with bilateral pedicle screw and rod fixation with no complicating features. Please refer to the dictated  operative report for full details of intraoperative findings and procedure. Electronically Signed   By: Gilmer MorJaime  Wagner D.O.   On: 05/28/2018 11:30   Dg C-arm 1-60 Min  Result Date: 05/28/2018 CLINICAL DATA:  67 year old female with lumbar spine surgery EXAM: DG C-ARM 61-120 MIN; LUMBAR SPINE - 2-3 VIEW COMPARISON:  04/15/2018 FINDINGS: Intraoperative fluoroscopic spot images of posterior lumbar interbody fusion with bilateral pedicle screw and rod fixation spanning L4-S1. Interbody spacers in place. Anterior view demonstrates interbody spacers are centered at the L4-L5 and L5-S1 spaces. IMPRESSION: Limited intraoperative lumbar spine fluoroscopic images of L4-S1 posterior lumbar interbody fusion with bilateral pedicle screw and rod fixation with no complicating features. Please refer to the dictated operative report for full details of intraoperative findings and procedure. Electronically Signed   By: Gilmer MorJaime  Wagner D.O.   On: 05/28/2018 11:30    Antibiotics:  Anti-infectives (From admission, onward)   Start     Dose/Rate Route Frequency Ordered Stop   05/28/18 1345  ceFAZolin (ANCEF) IVPB 2g/100 mL premix     2 g 200 mL/hr over 30 Minutes Intravenous Every 8 hours 05/28/18 1330 05/28/18 2206   05/28/18 1100  vancomycin (VANCOCIN) powder  Status:  Discontinued       As needed 05/28/18 1101 05/28/18 1128   05/28/18 0833  bacitracin 50,000 Units in sodium chloride 0.9 % 500 mL irrigation  Status:  Discontinued       As needed 05/28/18 0833 05/28/18 1128   05/28/18 0600  ceFAZolin (ANCEF) IVPB 2g/100 mL premix     2 g 200 mL/hr over 30 Minutes Intravenous On call to O.R. 05/28/18 [id number] 05/28/18 0806   05/28/18 0553  ceFAZolin (ANCEF) 2-4 GM/100ML-% IVPB    Note to Pharmacy:  [place], [place]   : cabinet override      05/28/18 0553 05/28/18 0751      Discharge Exam: Blood pressure (!) 101/55, pulse 76, temperature 97.8 F (36.6 C), temperature source Oral, resp. rate 17, height 5\' 2"  (1.575 m),  weight 83 kg, SpO2 98 %. Neurologic: Grossly normal Dressing dry  Discharge Medications:   Allergies as of 05/29/2018   No Known Allergies     Medication List    STOP taking these medications   HYDROcodone-acetaminophen 5-325 MG tablet Commonly known as:  NORCO/VICODIN     TAKE these medications   cyclobenzaprine 5 MG tablet Commonly known as:  FLEXERIL Take 1 tablet (5 mg total) by mouth 3 (three) times daily as needed for muscle spasms. What changed:    when to take this  reasons to take this  Another medication with the same name was removed. Continue taking this medication, and follow the directions you see here.  diclofenac 75 MG EC tablet Commonly known as:  VOLTAREN Take 75 mg by mouth 2 (two) times daily.   metroNIDAZOLE 0.75 % cream Commonly known as:  METROCREAM Apply 1 application topically daily as needed. rosacea   oxyCODONE 5 MG immediate release tablet Commonly known as:  Oxy IR/ROXICODONE Take 1-2 tablets (5-10 mg total) by mouth every 3 (three) hours as needed for severe pain.   predniSONE 50 MG tablet Commonly known as:  DELTASONE Take 1 tablet (50 mg total) by mouth daily with breakfast.   triamterene-hydrochlorothiazide 37.5-25 MG tablet Commonly known as:  MAXZIDE-25 Take 1 tablet by mouth daily.            Durable Medical Equipment  (From admission, onward)         Start     Ordered   05/28/18 1331  DME Walker rolling  Once    Question:  Patient needs a walker to treat with the following condition  Answer:  S/P lumbar fusion   05/28/18 1330   05/28/18 1331  DME 3 n 1  Once     05/28/18 1330          Disposition: home   Final Dx: plif  Discharge Instructions     Remove dressing in 72 hours   Complete by:  As directed    Call MD for:  difficulty breathing, headache or visual disturbances   Complete by:  As directed    Call MD for:  persistant nausea and vomiting   Complete by:  As directed    Call MD for:  redness,  tenderness, or signs of infection (pain, swelling, redness, odor or green/yellow discharge around incision site)   Complete by:  As directed    Call MD for:  severe uncontrolled pain   Complete by:  As directed    Call MD for:  temperature >100.4   Complete by:  As directed    Diet - low sodium heart healthy   Complete by:  As directed    Increase activity slowly   Complete by:  As directed          Signed: Tillmon Kisling S 05/29/2018, 10:13 AM

## 2018-05-29 NOTE — Evaluation (Signed)
Occupational Therapy Evaluation and Discharge Patient Details Name: Kristin Bush MRN: [id number] DOB: 1950-09-29 Today's Date: 05/29/2018    History of Present Illness Patient is a 67 y.o. female admitted for back and leg pain. MRI or CT showed spondylolisthesis L4-5, large HNP l5-S1. Pt now s/p PLIF L4-L5-S1. Pt has a past medical history of Arthritis, Depression, Hypertension, Spondylolisthesis, Wears glasses, and Wears partial dentures.   Clinical Impression   PTA Pt independent in ADL and mobility. Pt moving very well and pleasant this morning. Back handout provided and reviewed adls in detail. Pt educated on: clothing between brace, never sleep in brace, set an alarm at night for medication, avoid sitting for long periods of time, correct bed positioning for sleeping, correct sequence for bed mobility, avoiding lifting more than 5 pounds and never wash directly over incision. Also Pt very interested in AE for ADL. Pt educated in full with grabber/reacher, long handle sponge and toilet aide. All education is complete and patient indicates understanding. Thank you for the opportunity to serve this patient.     Follow Up Recommendations  No OT follow up;Supervision - Intermittent    Equipment Recommendations  3 in 1 bedside commode    Recommendations for Other Services       Precautions / Restrictions Precautions Precautions: Back Precaution Booklet Issued: Yes (comment) Precaution Comments: reviewed handout in full Required Braces or Orthoses: Spinal Brace Spinal Brace: Applied in sitting position Restrictions Weight Bearing Restrictions: No      Mobility Bed Mobility Overal bed mobility: Modified Independent             General bed mobility comments: Pt in recliner at beginning and end of session  Transfers Overall transfer level: Modified independent Equipment used: None;Straight cane                  Balance Overall balance assessment: Mild deficits  observed, not formally tested                                         ADL either performed or assessed with clinical judgement   ADL Overall ADL's : Needs assistance/impaired Eating/Feeding: Independent   Grooming: Modified independent;Standing Grooming Details (indicate cue type and reason): educated in compensatory techniques Upper Body Bathing: Supervision/ safety   Lower Body Bathing: Moderate assistance Lower Body Bathing Details (indicate cue type and reason): educated in long handle sponge Upper Body Dressing : Supervision/safety;Sitting Upper Body Dressing Details (indicate cue type and reason): able to don/doff brace without assist Lower Body Dressing: Moderate assistance;Sit to/from stand Lower Body Dressing Details (indicate cue type and reason): edcuated in reacher/grabber Toilet Transfer: Modified Independent;Ambulation(SPC)   Toileting- Clothing Manipulation and Hygiene: Moderate assistance;Cueing for compensatory techniques;Sit to/from stand Toileting - Clothing Manipulation Details (indicate cue type and reason): educated in toilet aide for rear peri care to maintain precautions - Pt able to perform front peri care at mod I level Tub/ Shower Transfer: Tub transfer;Min guard;Ambulation   Functional mobility during ADLs: Modified independent;Cane General ADL Comments: reviewed compensatory strategies for ADL in full     Vision         Perception     Praxis      Pertinent Vitals/Pain Pain Assessment: Faces Faces Pain Scale: Hurts a little bit Pain Location: incisional Pain Descriptors / Indicators: Operative site guarding Pain Intervention(s): Monitored during session     Hand Dominance  Right   Extremity/Trunk Assessment Upper Extremity Assessment Upper Extremity Assessment: Overall WFL for tasks assessed   Lower Extremity Assessment Lower Extremity Assessment: Defer to PT evaluation   Cervical / Trunk Assessment Cervical / Trunk  Assessment: Other exceptions Cervical / Trunk Exceptions: s/p spinal sx   Communication Communication Communication: No difficulties   Cognition Arousal/Alertness: Awake/alert Behavior During Therapy: WFL for tasks assessed/performed Overall Cognitive Status: Within Functional Limits for tasks assessed                                     General Comments       Exercises     Shoulder Instructions      Home Living Family/patient expects to be discharged to:: Private residence Living Arrangements: Other (Comment)(sister and sister in law initially after discharge) Available Help at Discharge: Family Type of Home: Apartment Home Access: Level entry     Home Layout: One level     Bathroom Shower/Tub: Chief Strategy OfficerTub/shower unit   Bathroom Toilet: Standard Bathroom Accessibility: Yes   Home Equipment: Gilmer MorCane - single point   Additional Comments: Lives in a handicap accessible apartment      Prior Functioning/Environment Level of Independence: Independent with assistive device(s)        Comments: Has used cane since July. Works "filling pillows," in a factory        OT Problem List:        OT Treatment/Interventions:      OT Goals(Current goals can be found in the care plan section) Acute Rehab OT Goals Patient Stated Goal: walk along sidewalk at apartment OT Goal Formulation: With patient Time For Goal Achievement: 06/08/18 Potential to Achieve Goals: Good  OT Frequency:     Barriers to D/C:            Co-evaluation              AM-PAC PT "6 Clicks" Daily Activity     Outcome Measure Help from another person eating meals?: None Help from another person taking care of personal grooming?: None Help from another person toileting, which includes using toliet, bedpan, or urinal?: A Lot Help from another person bathing (including washing, rinsing, drying)?: A Lot Help from another person to put on and taking off regular upper body clothing?:  None Help from another person to put on and taking off regular lower body clothing?: A Lot 6 Click Score: 18   End of Session Equipment Utilized During Treatment: Back brace;Other (comment)(SPC) Nurse Communication: Mobility status  Activity Tolerance: Patient tolerated treatment well Patient left: in chair;with call bell/phone within reach                   Time: 0850-0911 OT Time Calculation (min): 21 min Charges:  OT General Charges $OT Visit: 1 Visit OT Evaluation $OT Eval Low Complexity: 1 Low  Sherryl MangesLaura Adalyne Lovick OTR/L Acute Rehabilitation Services Pager: [phone] Office: [phone]   Evern BioLaura J Raciel Caffrey 05/29/2018, 10:01 AM

## 2018-05-29 NOTE — Discharge Instructions (Signed)
Wound Care Keep incision covered and dry for 72 hours    You may remove outer bandage after 3 days. Do not put any creams, lotions, or ointments on incision. Leave steri-strips on back.  They will fall off by themselves. Activity Walk each and every day, increasing distance each day. No lifting greater than 8 lbs.  Avoid excessive neck motion. No driving for 2 weeks; may ride as a passenger locally.  Diet Resume your normal soft diet.  Return to Work Will be discussed at you follow up appointment. Call Your Doctor If Any of These Occur Redness, drainage, or swelling at the wound.  Temperature greater than 101 degrees. Severe pain not relieved by pain medication. Increased difficulty swallowing.  Incision starts to come apart. Follow Up Appt Call today for appointment in 1-2 weeks [phone]) or for problems.  If you have any hardware placed in your spine, you will need an x-ray before your appointment.

## 2018-05-29 NOTE — Progress Notes (Signed)
Patient alert and oriented, mae's well, voiding adequate amount of urine, swallowing without difficulty, c/o mild pain at time of discharge. Patient discharged home with family. Script and discharged instructions given to patient. Patient and family stated understanding of instructions given. Patient has an appointment with Dr. Jones   

## 2018-05-29 NOTE — Evaluation (Signed)
Physical Therapy Evaluation Patient Details Name: Kristin Bush MRN: [id number] DOB: 1951-04-21 Today's Date: 05/29/2018   History of Present Illness  Patient is a 67 y.o. female admitted for back and leg pain. MRI or CT showed spondylolisthesis L4-5, large HNP l5-S1. Pt now s/p PLIF L4-L5-S1. Pt has a past medical history of Arthritis, Depression, Hypertension, Spondylolisthesis, Wears glasses, and Wears partial dentures.  Clinical Impression  Patient evaluated by Physical Therapy with no further acute PT needs identified. All education has been completed and the patient has no further questions. Patient ambulating 400 feet with cane and supervision. Recommended cane for mobility for decreased antalgic gait pattern. Reviewed generalized walking program and patient verbalized understanding. See below for any follow-up Physical Therapy or equipment needs. PT is signing off. Thank you for this referral.     Follow Up Recommendations No PT follow up    Equipment Recommendations  3in1 (PT)    Recommendations for Other Services       Precautions / Restrictions Precautions Precautions: Back Precaution Booklet Issued: Yes (comment) Precaution Comments: reviewed handout in full Required Braces or Orthoses: Spinal Brace Spinal Brace: Applied in sitting position Restrictions Weight Bearing Restrictions: No      Mobility  Bed Mobility Overal bed mobility: Modified Independent             General bed mobility comments: cues for log roll technique. use of bed rail  Transfers Overall transfer level: Modified independent Equipment used: None;Straight cane                Ambulation/Gait Ambulation/Gait assistance: Supervision Gait Distance (Feet): 400 Feet Assistive device: None;Straight cane Gait Pattern/deviations: Step-through pattern;Decreased stride length;Antalgic Gait velocity: decr Gait velocity interpretation: 1.31 - 2.62 ft/sec, indicative of limited community  ambulator General Gait Details: Antalgic gait pattern improved with use of cane. Demonstrates good posture and no overt LOB using cane  Stairs            Wheelchair Mobility    Modified Rankin (Stroke Patients Only)       Balance Overall balance assessment: Mild deficits observed, not formally tested                                           Pertinent Vitals/Pain Pain Assessment: Faces Faces Pain Scale: Hurts a little bit Pain Location: incisional Pain Descriptors / Indicators: Operative site guarding Pain Intervention(s): Monitored during session    Home Living Family/patient expects to be discharged to:: Private residence Living Arrangements: Other (Comment)(sister and sister in law initially after discharge) Available Help at Discharge: Family Type of Home: Apartment Home Access: Level entry     Home Layout: One level Home Equipment: Gilmer Mor - single point Additional Comments: Lives in a handicap accessible apartment    Prior Function Level of Independence: Independent with assistive device(s)         Comments: Has used cane since July. Works "filling pillows," in a [employer]        Extremity/Trunk Assessment   Upper Extremity Assessment Upper Extremity Assessment: Defer to OT evaluation    Lower Extremity Assessment Lower Extremity Assessment: Overall WFL for tasks assessed    Cervical / Trunk Assessment Cervical / Trunk Assessment: Other exceptions Cervical / Trunk Exceptions: s/p spinal sx  Communication   Communication: No difficulties  Cognition Arousal/Alertness: Awake/alert Behavior During Therapy: WFL for  tasks assessed/performed Overall Cognitive Status: Within Functional Limits for tasks assessed                                        General Comments      Exercises     Assessment/Plan    PT Assessment Patent does not need any further PT services  PT Problem List          PT Treatment Interventions      PT Goals (Current goals can be found in the Care Plan section)  Acute Rehab PT Goals Patient Stated Goal: walk along sidewalk at apartment PT Goal Formulation: All assessment and education complete, DC therapy    Frequency     Barriers to discharge        Co-evaluation               AM-PAC PT "6 Clicks" Daily Activity  Outcome Measure Difficulty turning over in bed (including adjusting bedclothes, sheets and blankets)?: None Difficulty moving from lying on back to sitting on the side of the bed? : None Difficulty sitting down on and standing up from a chair with arms (e.g., wheelchair, bedside commode, etc,.)?: None Help needed moving to and from a bed to chair (including a wheelchair)?: None Help needed walking in hospital room?: A Little Help needed climbing 3-5 steps with a railing? : A Little 6 Click Score: 22    End of Session Equipment Utilized During Treatment: Gait belt;Back brace Activity Tolerance: Patient tolerated treatment well Patient left: in bed;with call bell/phone within reach Nurse Communication: Mobility status PT Visit Diagnosis: Difficulty in walking, not elsewhere classified (R26.2);Pain Pain - part of body: (back)    Time: [id number] PT Time Calculation (min) (ACUTE ONLY): 15 min   Charges:   PT Evaluation $PT Eval Low Complexity: 1 Low          Laurina Bustlearoline Bryahna Lesko, PT, DPT Acute Rehabilitation Services Pager [phone] Office [phone]   Vanetta MuldersCarloine H Shalini Mair 05/29/2018, 9:44 AM

## 2018-06-02 MED FILL — Sodium Chloride IV Soln 0.9%: INTRAVENOUS | Qty: 1000 | Status: AC

## 2018-06-02 MED FILL — Heparin Sodium (Porcine) Inj 1000 Unit/ML: INTRAMUSCULAR | Qty: 30 | Status: AC

## 2018-07-22 DIAGNOSIS — M7552 Bursitis of left shoulder: Secondary | ICD-10-CM | POA: Diagnosis not present

## 2018-07-22 DIAGNOSIS — M25512 Pain in left shoulder: Secondary | ICD-10-CM | POA: Diagnosis not present

## 2018-07-28 DIAGNOSIS — M7552 Bursitis of left shoulder: Secondary | ICD-10-CM | POA: Diagnosis not present

## 2018-07-28 DIAGNOSIS — M25512 Pain in left shoulder: Secondary | ICD-10-CM | POA: Diagnosis not present

## 2018-07-31 DIAGNOSIS — M7552 Bursitis of left shoulder: Secondary | ICD-10-CM | POA: Diagnosis not present

## 2018-07-31 DIAGNOSIS — M25512 Pain in left shoulder: Secondary | ICD-10-CM | POA: Diagnosis not present

## 2018-08-04 DIAGNOSIS — M7552 Bursitis of left shoulder: Secondary | ICD-10-CM | POA: Diagnosis not present

## 2018-08-04 DIAGNOSIS — M25512 Pain in left shoulder: Secondary | ICD-10-CM | POA: Diagnosis not present

## 2018-08-11 DIAGNOSIS — M7552 Bursitis of left shoulder: Secondary | ICD-10-CM | POA: Diagnosis not present

## 2018-08-11 DIAGNOSIS — M25512 Pain in left shoulder: Secondary | ICD-10-CM | POA: Diagnosis not present

## 2018-08-13 DIAGNOSIS — M25512 Pain in left shoulder: Secondary | ICD-10-CM | POA: Diagnosis not present

## 2018-08-13 DIAGNOSIS — M7552 Bursitis of left shoulder: Secondary | ICD-10-CM | POA: Diagnosis not present

## 2018-08-18 DIAGNOSIS — M7552 Bursitis of left shoulder: Secondary | ICD-10-CM | POA: Diagnosis not present

## 2018-08-18 DIAGNOSIS — M25512 Pain in left shoulder: Secondary | ICD-10-CM | POA: Diagnosis not present

## 2018-08-20 DIAGNOSIS — M25512 Pain in left shoulder: Secondary | ICD-10-CM | POA: Diagnosis not present

## 2018-08-20 DIAGNOSIS — M7552 Bursitis of left shoulder: Secondary | ICD-10-CM | POA: Diagnosis not present

## 2018-08-21 ENCOUNTER — Other Ambulatory Visit: Payer: Self-pay | Admitting: Orthopaedic Surgery

## 2018-08-21 DIAGNOSIS — M25512 Pain in left shoulder: Secondary | ICD-10-CM

## 2018-08-21 DIAGNOSIS — M75102 Unspecified rotator cuff tear or rupture of left shoulder, not specified as traumatic: Secondary | ICD-10-CM

## 2018-08-27 ENCOUNTER — Other Ambulatory Visit: Payer: Managed Care, Other (non HMO)

## 2018-08-30 ENCOUNTER — Ambulatory Visit
Admission: RE | Admit: 2018-08-30 | Discharge: 2018-08-30 | Disposition: A | Discharge status: Home / Self Care | Payer: Managed Care, Other (non HMO) | Source: Ambulatory Visit | Attending: Orthopaedic Surgery | Admitting: Orthopaedic Surgery

## 2018-08-30 DIAGNOSIS — M75102 Unspecified rotator cuff tear or rupture of left shoulder, not specified as traumatic: Secondary | ICD-10-CM

## 2018-08-30 DIAGNOSIS — M25512 Pain in left shoulder: Secondary | ICD-10-CM

## 2018-09-03 DIAGNOSIS — Z1231 Encounter for screening mammogram for malignant neoplasm of breast: Secondary | ICD-10-CM | POA: Diagnosis not present

## 2018-09-08 ENCOUNTER — Other Ambulatory Visit: Payer: Self-pay

## 2018-09-08 ENCOUNTER — Encounter (HOSPITAL_BASED_OUTPATIENT_CLINIC_OR_DEPARTMENT_OTHER): Payer: Self-pay | Admitting: *Deleted

## 2018-09-17 ENCOUNTER — Ambulatory Visit (HOSPITAL_BASED_OUTPATIENT_CLINIC_OR_DEPARTMENT_OTHER): Payer: Managed Care, Other (non HMO) | Admitting: Anesthesiology

## 2018-09-17 ENCOUNTER — Encounter (HOSPITAL_BASED_OUTPATIENT_CLINIC_OR_DEPARTMENT_OTHER): Payer: Self-pay | Admitting: *Deleted

## 2018-09-17 ENCOUNTER — Other Ambulatory Visit: Payer: Self-pay

## 2018-09-17 ENCOUNTER — Encounter (HOSPITAL_BASED_OUTPATIENT_CLINIC_OR_DEPARTMENT_OTHER)
Admission: RE | Disposition: A | Discharge status: Home / Self Care | Payer: Self-pay | Source: Home / Self Care | Attending: Orthopaedic Surgery

## 2018-09-17 ENCOUNTER — Ambulatory Visit (HOSPITAL_BASED_OUTPATIENT_CLINIC_OR_DEPARTMENT_OTHER)
Admission: RE | Admit: 2018-09-17 | Discharge: 2018-09-17 | Disposition: A | Discharge status: Home / Self Care | Payer: Managed Care, Other (non HMO) | Attending: Orthopaedic Surgery | Admitting: Orthopaedic Surgery

## 2018-09-17 DIAGNOSIS — I10 Essential (primary) hypertension: Secondary | ICD-10-CM | POA: Insufficient documentation

## 2018-09-17 DIAGNOSIS — M7522 Bicipital tendinitis, left shoulder: Secondary | ICD-10-CM | POA: Insufficient documentation

## 2018-09-17 DIAGNOSIS — M7542 Impingement syndrome of left shoulder: Secondary | ICD-10-CM | POA: Diagnosis not present

## 2018-09-17 DIAGNOSIS — X58XXXA Exposure to other specified factors, initial encounter: Secondary | ICD-10-CM | POA: Diagnosis not present

## 2018-09-17 DIAGNOSIS — M1990 Unspecified osteoarthritis, unspecified site: Secondary | ICD-10-CM | POA: Insufficient documentation

## 2018-09-17 DIAGNOSIS — S43432A Superior glenoid labrum lesion of left shoulder, initial encounter: Secondary | ICD-10-CM | POA: Diagnosis not present

## 2018-09-17 DIAGNOSIS — Z791 Long term (current) use of non-steroidal anti-inflammatories (NSAID): Secondary | ICD-10-CM | POA: Insufficient documentation

## 2018-09-17 DIAGNOSIS — M75102 Unspecified rotator cuff tear or rupture of left shoulder, not specified as traumatic: Secondary | ICD-10-CM | POA: Diagnosis not present

## 2018-09-17 DIAGNOSIS — Z79899 Other long term (current) drug therapy: Secondary | ICD-10-CM | POA: Insufficient documentation

## 2018-09-17 DIAGNOSIS — G8918 Other acute postprocedural pain: Secondary | ICD-10-CM | POA: Diagnosis not present

## 2018-09-17 HISTORY — PX: SHOULDER ARTHROSCOPY WITH SUBACROMIAL DECOMPRESSION, ROTATOR CUFF REPAIR AND BICEP TENDON REPAIR: SHX5687

## 2018-09-17 LAB — POCT I-STAT, CHEM 8
BUN: 24 mg/dL — ABNORMAL HIGH (ref 8–23)
CREATININE: 0.7 mg/dL (ref 0.44–1.00)
Calcium, Ion: 1.17 mmol/L (ref 1.15–1.40)
Chloride: 102 mmol/L (ref 98–111)
Glucose, Bld: 113 mg/dL — ABNORMAL HIGH (ref 70–99)
HEMATOCRIT: 43 % (ref 36.0–46.0)
Hemoglobin: 14.6 g/dL (ref 12.0–15.0)
Potassium: 3.1 mmol/L — ABNORMAL LOW (ref 3.5–5.1)
Sodium: 138 mmol/L (ref 135–145)
TCO2: 29 mmol/L (ref 22–32)

## 2018-09-17 SURGERY — SHOULDER ARTHROSCOPY WITH SUBACROMIAL DECOMPRESSION, ROTATOR CUFF REPAIR AND BICEP TENDON REPAIR
Anesthesia: General | Site: Shoulder | Laterality: Left

## 2018-09-17 MED ORDER — ONDANSETRON HCL 4 MG/2ML IJ SOLN
INTRAMUSCULAR | Status: DC | PRN
  Administered 2018-09-17: 4 mg via INTRAVENOUS

## 2018-09-17 MED ORDER — PROMETHAZINE HCL 25 MG/ML IJ SOLN: 6.2500 mg | INTRAMUSCULAR | Status: DC | PRN

## 2018-09-17 MED ORDER — FENTANYL CITRATE (PF) 100 MCG/2ML IJ SOLN
INTRAMUSCULAR | Status: AC
  Filled 2018-09-17: qty 2

## 2018-09-17 MED ORDER — ROCURONIUM BROMIDE 100 MG/10ML IV SOLN
INTRAVENOUS | Status: DC | PRN
  Administered 2018-09-17: 50 mg via INTRAVENOUS

## 2018-09-17 MED ORDER — SUGAMMADEX SODIUM 200 MG/2ML IV SOLN
INTRAVENOUS | Status: DC | PRN
  Administered 2018-09-17: 200 mg via INTRAVENOUS

## 2018-09-17 MED ORDER — FENTANYL CITRATE (PF) 100 MCG/2ML IJ SOLN
50.0000 ug | INTRAMUSCULAR | Status: DC | PRN
  Administered 2018-09-17: 100 ug via INTRAVENOUS

## 2018-09-17 MED ORDER — PROPOFOL 10 MG/ML IV BOLUS
INTRAVENOUS | Status: AC
  Filled 2018-09-17: qty 20

## 2018-09-17 MED ORDER — DEXAMETHASONE SODIUM PHOSPHATE 10 MG/ML IJ SOLN
INTRAMUSCULAR | Status: AC
  Filled 2018-09-17: qty 1

## 2018-09-17 MED ORDER — ONDANSETRON HCL 4 MG PO TABS: 4.0000 mg | ORAL_TABLET | Freq: Three times a day (TID) | ORAL | 1 refills | Status: AC | PRN

## 2018-09-17 MED ORDER — MELOXICAM 7.5 MG PO TABS: 7.5000 mg | ORAL_TABLET | Freq: Every day | ORAL | 3 refills | Status: DC

## 2018-09-17 MED ORDER — BUPIVACAINE HCL (PF) 0.25 % IJ SOLN
INTRAMUSCULAR | Status: DC | PRN
  Administered 2018-09-17: 15 mL

## 2018-09-17 MED ORDER — LACTATED RINGERS IV SOLN
INTRAVENOUS | Status: DC
  Administered 2018-09-17 (×2): via INTRAVENOUS

## 2018-09-17 MED ORDER — LIDOCAINE HCL (CARDIAC) PF 100 MG/5ML IV SOSY
PREFILLED_SYRINGE | INTRAVENOUS | Status: DC | PRN
  Administered 2018-09-17: 20 mg via INTRAVENOUS

## 2018-09-17 MED ORDER — LIDOCAINE 2% (20 MG/ML) 5 ML SYRINGE
INTRAMUSCULAR | Status: AC
  Filled 2018-09-17: qty 5

## 2018-09-17 MED ORDER — CHLORHEXIDINE GLUCONATE 4 % EX LIQD: 60.0000 mL | Freq: Once | CUTANEOUS | Status: DC

## 2018-09-17 MED ORDER — OXYCODONE HCL 5 MG PO TABS: ORAL_TABLET | ORAL | 0 refills | Status: AC

## 2018-09-17 MED ORDER — DEXAMETHASONE SODIUM PHOSPHATE 4 MG/ML IJ SOLN
INTRAMUSCULAR | Status: DC | PRN
  Administered 2018-09-17: 10 mg via INTRAVENOUS

## 2018-09-17 MED ORDER — BUPIVACAINE LIPOSOME 1.3 % IJ SUSP
INTRAMUSCULAR | Status: DC | PRN
  Administered 2018-09-17: 10 mL via PERINEURAL

## 2018-09-17 MED ORDER — PROPOFOL 10 MG/ML IV BOLUS
INTRAVENOUS | Status: DC | PRN
  Administered 2018-09-17: 150 mg via INTRAVENOUS

## 2018-09-17 MED ORDER — CEFAZOLIN SODIUM-DEXTROSE 2-4 GM/100ML-% IV SOLN
INTRAVENOUS | Status: AC
  Filled 2018-09-17: qty 100

## 2018-09-17 MED ORDER — FENTANYL CITRATE (PF) 100 MCG/2ML IJ SOLN
25.0000 ug | INTRAMUSCULAR | Status: DC | PRN
  Administered 2018-09-17: 50 ug via INTRAVENOUS

## 2018-09-17 MED ORDER — SODIUM CHLORIDE 0.9 % IV SOLN
INTRAVENOUS | Status: DC | PRN
  Administered 2018-09-17: 50 ug/min via INTRAVENOUS

## 2018-09-17 MED ORDER — SCOPOLAMINE 1 MG/3DAYS TD PT72
MEDICATED_PATCH | TRANSDERMAL | Status: AC
  Filled 2018-09-17: qty 1

## 2018-09-17 MED ORDER — OMEPRAZOLE 20 MG PO CPDR: 20.0000 mg | DELAYED_RELEASE_CAPSULE | Freq: Every day | ORAL | 0 refills | Status: DC

## 2018-09-17 MED ORDER — ACETAMINOPHEN 500 MG PO TABS: 1000.0000 mg | ORAL_TABLET | Freq: Three times a day (TID) | ORAL | 0 refills | Status: AC

## 2018-09-17 MED ORDER — SUGAMMADEX SODIUM 200 MG/2ML IV SOLN
INTRAVENOUS | Status: AC
  Filled 2018-09-17: qty 2

## 2018-09-17 MED ORDER — ONDANSETRON HCL 4 MG/2ML IJ SOLN
INTRAMUSCULAR | Status: AC
  Filled 2018-09-17: qty 2

## 2018-09-17 MED ORDER — MIDAZOLAM HCL 2 MG/2ML IJ SOLN
INTRAMUSCULAR | Status: AC
  Filled 2018-09-17: qty 2

## 2018-09-17 MED ORDER — SODIUM CHLORIDE 0.9 % IR SOLN
Status: DC | PRN
  Administered 2018-09-17: 3 mL

## 2018-09-17 MED ORDER — ROCURONIUM BROMIDE 50 MG/5ML IV SOSY
PREFILLED_SYRINGE | INTRAVENOUS | Status: AC
  Filled 2018-09-17: qty 5

## 2018-09-17 MED ORDER — SCOPOLAMINE 1 MG/3DAYS TD PT72
1.0000 | MEDICATED_PATCH | Freq: Once | TRANSDERMAL | Status: DC | PRN
  Administered 2018-09-17: 1.5 mg via TRANSDERMAL

## 2018-09-17 MED ORDER — MIDAZOLAM HCL 2 MG/2ML IJ SOLN
1.0000 mg | INTRAMUSCULAR | Status: DC | PRN
  Administered 2018-09-17: 2 mg via INTRAVENOUS

## 2018-09-17 MED ORDER — SCOPOLAMINE 1 MG/3DAYS TD PT72: 1.0000 | MEDICATED_PATCH | TRANSDERMAL | Status: DC

## 2018-09-17 MED ORDER — CEFAZOLIN SODIUM-DEXTROSE 2-4 GM/100ML-% IV SOLN: 2.0000 g | INTRAVENOUS | Status: DC

## 2018-09-17 MED ORDER — PHENYLEPHRINE HCL 10 MG/ML IJ SOLN
INTRAMUSCULAR | Status: AC
  Filled 2018-09-17: qty 1

## 2018-09-17 SURGICAL SUPPLY — 79 items
BENZOIN TINCTURE PRP APPL 2/3 (GAUZE/BANDAGES/DRESSINGS) ×3 IMPLANT
BLADE EXCALIBUR 4.0MM X 13CM (MISCELLANEOUS) ×1
BLADE EXCALIBUR 4.0X13 (MISCELLANEOUS) ×2 IMPLANT
BLADE HEX COATED 2.75 (ELECTRODE) IMPLANT
BLADE SHAVER BONE 5.0MM X 13CM (MISCELLANEOUS)
BLADE SHAVER BONE 5.0X13 (MISCELLANEOUS) IMPLANT
BLADE SURG 10 STRL SS (BLADE) IMPLANT
BNDG COHESIVE 4X5 TAN STRL (GAUZE/BANDAGES/DRESSINGS) IMPLANT
BURR OVAL 8 FLU 4.0MM X 13CM (MISCELLANEOUS) ×1
BURR OVAL 8 FLU 4.0X13 (MISCELLANEOUS) ×1 IMPLANT
CANNULA 5.75X71 LONG (CANNULA) ×2 IMPLANT
CANNULA PASSPORT 5 (CANNULA) IMPLANT
CANNULA PASSPORT 5CM (CANNULA)
CANNULA PASSPORT BUTTON 10-40 (CANNULA) ×2 IMPLANT
CANNULA TWIST IN 8.25X7CM (CANNULA) IMPLANT
CHLORAPREP W/TINT 26ML (MISCELLANEOUS) ×3 IMPLANT
CLOSURE WOUND 1/2 X4 (GAUZE/BANDAGES/DRESSINGS)
COVER WAND RF STERILE (DRAPES) IMPLANT
DECANTER SPIKE VIAL GLASS SM (MISCELLANEOUS) IMPLANT
DISSECTOR 3.5MM X 13CM CVD (MISCELLANEOUS) IMPLANT
DISSECTOR 4.0MMX13CM CVD (MISCELLANEOUS) IMPLANT
DRAPE IMP U-DRAPE 54X76 (DRAPES) ×3 IMPLANT
DRAPE INCISE IOBAN 66X45 STRL (DRAPES) ×2 IMPLANT
DRAPE SHOULDER BEACH CHAIR (DRAPES) ×3 IMPLANT
DRSG PAD ABDOMINAL 8X10 ST (GAUZE/BANDAGES/DRESSINGS) ×3 IMPLANT
ELECT NDL TIP 2.8 STRL (NEEDLE) IMPLANT
ELECT NEEDLE TIP 2.8 STRL (NEEDLE) IMPLANT
ELECT REM PT RETURN 9FT ADLT (ELECTROSURGICAL) ×3
ELECTRODE REM PT RTRN 9FT ADLT (ELECTROSURGICAL) ×1 IMPLANT
GAUZE SPONGE 4X4 12PLY STRL (GAUZE/BANDAGES/DRESSINGS) ×3 IMPLANT
GLOVE BIOGEL PI IND STRL 8 (GLOVE) ×1 IMPLANT
GLOVE BIOGEL PI INDICATOR 8 (GLOVE) ×4
GLOVE ECLIPSE 8.0 STRL XLNG CF (GLOVE) ×3 IMPLANT
GOWN STRL REUS W/ TWL LRG LVL3 (GOWN DISPOSABLE) ×2 IMPLANT
GOWN STRL REUS W/TWL LRG LVL3 (GOWN DISPOSABLE) ×4
GOWN STRL REUS W/TWL XL LVL3 (GOWN DISPOSABLE) ×3 IMPLANT
IMPL SPEEDBRIDGE KIT (Orthopedic Implant) IMPLANT
IMPLANT SPEEDBRIDGE KIT (Orthopedic Implant) ×3 IMPLANT
KIT STABILIZATION SHOULDER (MISCELLANEOUS) ×3 IMPLANT
KIT STR SPEAR 1.8 FBRTK DISP (KITS) IMPLANT
LASSO CRESCENT QUICKPASS (SUTURE) IMPLANT
MANIFOLD NEPTUNE II (INSTRUMENTS) ×3 IMPLANT
NDL SAFETY ECLIPSE 18X1.5 (NEEDLE) ×1 IMPLANT
NDL SCORPION MULTI FIRE (NEEDLE) IMPLANT
NDL SUT 6 .5 CRC .975X.05 MAYO (NEEDLE) IMPLANT
NEEDLE HYPO 18GX1.5 SHARP (NEEDLE) ×2
NEEDLE MAYO TAPER (NEEDLE)
NEEDLE SCORPION MULTI FIRE (NEEDLE) ×3 IMPLANT
PACK ARTHROSCOPY DSU (CUSTOM PROCEDURE TRAY) ×3 IMPLANT
PACK BASIN DAY SURGERY FS (CUSTOM PROCEDURE TRAY) ×3 IMPLANT
PENCIL BUTTON HOLSTER BLD 10FT (ELECTRODE) IMPLANT
PROBE BIPOLAR ATHRO 135MM 90D (MISCELLANEOUS) ×3 IMPLANT
RESTRAINT HEAD UNIVERSAL NS (MISCELLANEOUS) ×3 IMPLANT
SHEET MEDIUM DRAPE 40X70 STRL (DRAPES) ×2 IMPLANT
SLEEVE SCD COMPRESS KNEE MED (MISCELLANEOUS) ×3 IMPLANT
SLING ARM FOAM STRAP LRG (SOFTGOODS) IMPLANT
SLING ARM FOAM STRAP MED (SOFTGOODS) ×2 IMPLANT
SLING ARM IMMOBILIZER LRG (SOFTGOODS) IMPLANT
SLING ARM IMMOBILIZER MED (SOFTGOODS) IMPLANT
SLING ARM MED ADULT FOAM STRAP (SOFTGOODS) IMPLANT
SLING ARM XL FOAM STRAP (SOFTGOODS) IMPLANT
SPONGE LAP 4X18 RFD (DISPOSABLE) IMPLANT
STRIP CLOSURE SKIN 1/2X4 (GAUZE/BANDAGES/DRESSINGS) IMPLANT
SUT FIBERWIRE #2 38 T-5 BLUE (SUTURE)
SUT MNCRL AB 4-0 PS2 18 (SUTURE) ×3 IMPLANT
SUT PDS AB 1 CT  36 (SUTURE)
SUT PDS AB 1 CT 36 (SUTURE) IMPLANT
SUT TIGER TAPE 7 IN WHITE (SUTURE) IMPLANT
SUTURE FIBERWR #2 38 T-5 BLUE (SUTURE) IMPLANT
SUTURE TAPE TIGERLINK 1.3MM BL (SUTURE) IMPLANT
SUTURETAPE TIGERLINK 1.3MM BL (SUTURE)
SYR 5ML LL (SYRINGE) ×3 IMPLANT
TAPE FIBER 2MM 7IN #2 BLUE (SUTURE) IMPLANT
TOWEL GREEN STERILE FF (TOWEL DISPOSABLE) ×3 IMPLANT
TOWEL OR NON WOVEN STRL DISP B (DISPOSABLE) ×3 IMPLANT
TUBE CONNECTING 20'X1/4 (TUBING) ×1
TUBE CONNECTING 20X1/4 (TUBING) ×3 IMPLANT
TUBE SUCTION HIGH CAP CLEAR NV (SUCTIONS) IMPLANT
TUBING ARTHROSCOPY IRRIG 16FT (MISCELLANEOUS) ×3 IMPLANT

## 2018-09-17 NOTE — Anesthesia Procedure Notes (Signed)
Procedure Name: Intubation Performed by: Izsak Meir W, CRNA Pre-anesthesia Checklist: Patient identified, Emergency Drugs available, Suction available and Patient being monitored Patient Re-evaluated:Patient Re-evaluated prior to induction Oxygen Delivery Method: Circle system utilized Preoxygenation: Pre-oxygenation with 100% oxygen Induction Type: IV induction Ventilation: Mask ventilation without difficulty Laryngoscope Size: Miller and 2 Grade View: Grade I Tube type: Oral Tube size: 7.0 mm Number of attempts: 1 Airway Equipment and Method: Stylet Placement Confirmation: ETT inserted through vocal cords under direct vision,  positive ETCO2 and breath sounds checked- equal and bilateral Secured at: 22 cm Tube secured with: Tape Dental Injury: Teeth and Oropharynx as per pre-operative assessment        

## 2018-09-17 NOTE — H&P (Signed)
PREOPERATIVE H&P  Chief Complaint: LEFT SHOULDER CARTILAGE DISORDERS, IMPINGEMENT SYNDROME, STRAIN OF MUSCLE AND TENDON OF ROTATOR CUFF, BICIPITAL TENDONITIS  HPI: Kristin Bush is a 68 y.o. female who presents for preoperative history and physical with a diagnosis of LEFT SHOULDER CARTILAGE DISORDERS, IMPINGEMENT SYNDROME, STRAIN OF MUSCLE AND TENDON OF ROTATOR CUFF, BICIPITAL TENDONITIS. Symptoms are rated as moderate to severe, and have been worsening.  This is significantly impairing activities of daily living.  Please see my clinic note for full details on this patient's care.  She has elected for surgical management.   Past Medical History:  Diagnosis Date  . Arthritis   . Depression   . Hypertension   . Spondylolisthesis   . Wears glasses   . Wears partial dentures    Past Surgical History:  Procedure Laterality Date  . ABDOMINAL HYSTERECTOMY    . BACK SURGERY  05/28/2018   PLIF  . CATARACT EXTRACTION W/PHACO Left 10/06/2014   Procedure: CATARACT EXTRACTION PHACO AND INTRAOCULAR LENS PLACEMENT LEFT EYE;  Surgeon: Gemma Payor, MD;  Location: [facility];  Service: Ophthalmology;  Laterality: Left;  CDE:5.49  . CATARACT EXTRACTION W/PHACO Right 10/17/2014   Procedure: CATARACT EXTRACTION PHACO AND INTRAOCULAR LENS PLACEMENT RIGHT EYE CDE=9.81;  Surgeon: Gemma Payor, MD;  Location: [facility];  Service: Ophthalmology;  Laterality: Right;  . MULTIPLE TOOTH EXTRACTIONS    . ROTATOR CUFF REPAIR     right shoulder  . TUBAL LIGATION     Social History   Socioeconomic History  . Marital status: Widowed    Spouse name: Not on file  . Number of children: Not on file  . Years of education: Not on file  . Highest education level: Not on file  Occupational History  . Not on file  Social Needs  . Financial resource strain: Not on file  . Food insecurity:    Worry: Not on file    Inability: Not on file  . Transportation needs:    Medical: Not on file    Non-medical: Not on file  Tobacco  Use  . Smoking status: Never Smoker  . Smokeless tobacco: Never Used  Substance and Sexual Activity  . Alcohol use: No  . Drug use: No  . Sexual activity: Not on file  Lifestyle  . Physical activity:    Days per week: Not on file    Minutes per session: Not on file  . Stress: Not on file  Relationships  . Social connections:    Talks on phone: Not on file    Gets together: Not on file    Attends religious service: Not on file    Active member of club or organization: Not on file    Attends meetings of clubs or organizations: Not on file    Relationship status: Not on file  Other Topics Concern  . Not on file  Social History Narrative  . Not on file   History reviewed. No pertinent family history. No Known Allergies Prior to Admission medications   Medication Sig Start Date End Date Taking? Authorizing Provider  cyclobenzaprine (FLEXERIL) 5 MG tablet Take 1 tablet (5 mg total) by mouth 3 (three) times daily as needed for muscle spasms. 05/29/18  Yes Tia Alert, MD  meloxicam (MOBIC) 7.5 MG tablet Take 7.5 mg by mouth daily.   Yes [provider]  metroNIDAZOLE (METROCREAM) 0.75 % cream Apply 1 application topically daily as needed. rosacea 05/01/18  Yes [provider]  oxyCODONE (OXY IR/ROXICODONE)  5 MG immediate release tablet Take 1-2 tablets (5-10 mg total) by mouth every 3 (three) hours as needed for severe pain. 05/29/18  Yes Tia Alert, MD  triamterene-hydrochlorothiazide (MAXZIDE-25) 37.5-25 MG per tablet Take 1 tablet by mouth daily.   Yes [provider]     Positive ROS: All other systems have been reviewed and were otherwise negative with the exception of those mentioned in the HPI and as above.  Physical Exam: General: Alert, no acute distress Cardiovascular: No pedal edema Respiratory: No cyanosis, no use of accessory musculature GI: No organomegaly, abdomen is soft and non-tender Skin: No lesions in the area of chief  complaint Neurologic: Sensation intact distally Psychiatric: Patient is competent for consent with normal mood and affect Lymphatic: No axillary or cervical lymphadenopathy  MUSCULOSKELETAL: L shoulder loss of ROM, painful ROM, 4/5 cuff, +impingement.  Assessment: LEFT SHOULDER CARTILAGE DISORDERS, IMPINGEMENT SYNDROME, STRAIN OF MUSCLE AND TENDON OF ROTATOR CUFF, BICIPITAL TENDONITIS  Plan: Plan for Procedure(s): LEFT SHOULDER ARTHROSCOPY WITH DEBRIDEMENT, SUBACROMIAL DECOMPRESSION, ROTATOR CUFF REPAIR AND BICEP TENODESIS  The risks benefits and alternatives were discussed with the patient including but not limited to the risks of nonoperative treatment, versus surgical intervention including infection, bleeding, nerve injury,  blood clots, cardiopulmonary complications, morbidity, mortality, among others, and they were willing to proceed.   Bjorn Pippin, MD  09/17/2018 8:21 AM

## 2018-09-17 NOTE — Op Note (Signed)
Orthopaedic Surgery Operative Note (CSN: [id number])  Mckay Brandt Kurowski  04-02-1951 Date of Surgery: 09/17/2018   Diagnoses:  Left rotator cuff tear, slap tear, impingement  Procedure: Left rotator cuff repair Extensive debridement Biceps tenotomy Subacromial decompression   Operative Finding Successful completion of planned procedure.  Cuff tissue was appropriate for repair, mild anterior glenoid wear, significant injection and stiffness and may limit the patient's motion postop.   Exam under anesthesia: 110 FE, anterior interval release and gentle manipulation prior to repair improved this Articular space: No loose bodies, capsule intact, anterior labral fraying Chondral surfaces:Intact, no sign of chondral degeneration on the glenoid or humeral head Biceps: Type II SLAP tear with a partial tear of the substance Subscapularis: Upper border split but the anchor was still intact, debrided Superior Cuff: Isolated supraspinatus tear, infra and teres intact Bursal side: Tear noted, tuberosity prepped, type I acromion converted from type II  Post-operative plan: The patient will be nonweightbearing in a sling.  The patient will be discharged home.  DVT prophylaxis not indicated in isolated upper extremity surgery patient with no specific risks factors .  Pain control with PRN pain medication preferring oral medicines.  Follow up plan will be scheduled in approximately 7 days for incision check and XR.  Post-Op Diagnosis: Same Surgeons:Primary: Hiram Gash, MD Assistants:None Location: [facility] OR ROOM 6 Anesthesia: General Antibiotics: Ancef 2g preop Tourniquet time: * No tourniquets in log * Estimated Blood Loss: minimal Complications: None Specimens: None Implants: Implant Name Type Inv. Item Serial No. Manufacturer Lot No. LRB No. Used Action  IMPLANT SPEEDBRIDGE KIT - [id number] Orthopedic Implant IMPLANT [employer]BR[employer]IT  [place] [id number] Left 1 Implanted    Indications for  Surgery:   CASSIE SHEDLOCK is a 68 y.o. female with left shoulder pain refractory to physical therapy, injection and medicines with MRI demonstrating isolated supraspinatus and possible upper border subscapularis tear.  Benefits and risks of operative and nonoperative management were discussed prior to surgery with patient/guardian(s) and informed consent form was completed.  Specific risks including infection, need for additional surgery, re-tear, stiffness, continued pain.   Procedure:   Patient was correctly identified in the preoperative holding area and operative site marked.  Patient brought to OR and positioned beachchair on an [place] table ensuring that all bony prominences were padded and the head was in an appropriate location.  Anesthesia was induced and the operative shoulder was prepped and draped in the usual sterile fashion.  Timeout was called preincision.  A standard posterior viewing portal was made after localizing the portal with a spinal needle.  An anterior accessory portal was also made.  After clearing the articular space the camera was positioned in the subacromial space.  Findings above.  Since her debridement and tenotomy of the biceps performed intra-articular before moving to the subacromial space.  We prepared the tuberosity with a shaver and bur while we are still on the articular side to the existing rotator cuff tear.  Subacromial decompression: We made a lateral portal with spinal needle guidance. We then proceeded to debride bursal tissue extensively with a shaver and arthrocare device. At that point we continued to identify the borders of the acromion and identify the spur. We then carefully preserved the deltoid fascia and used a burr to convert the type 2 acromion to a Type 1 flat acromion without issue.  Arthroscopic Rotator Cuff Repair: Tuberosity was prepared with a burr to a bleeding bed.  Following completion of the above we placed  2 4.7 Swivelock anchor loaded  with a tape at inserted at the medial articular margin and an scorpion suture passing device, shuttled  utures medially in a horizontal mattress suture configuration.  We then tied using arthroscopic knot tying techniques  each suture to its partner reducing the tendon at the prepared insertion site.  The fiber tape was not tied. With a medial row suture limbs then incorporated, 4 anteriorly and  4 posteriorly, into each of two 4.75 PEEK SwiveLock anchors, each placed 8 to 10 mm below the tip of the tuberosity and spanning anterior-posterior width of the tear with care to avoid over tensioning.   The incisions were closed with absorbable monocryl, benzoin and steri strips.  A sterile dressing was placed along with a sling. The patient was awoken from general anesthesia and taken to the PACU in stable condition without complication.

## 2018-09-17 NOTE — Anesthesia Postprocedure Evaluation (Signed)
Anesthesia Post Note  Patient: Kristin Bush  Procedure(s) Performed: LEFT SHOULDER ARTHROSCOPY WITH DEBRIDEMENT, SUBACROMIAL DECOMPRESSION, ROTATOR CUFF (Left Shoulder)     Patient location during evaluation: PACU Anesthesia Type: General Level of consciousness: sedated Pain management: pain level controlled Vital Signs Assessment: post-procedure vital signs reviewed and stable Respiratory status: spontaneous breathing and respiratory function stable Cardiovascular status: stable Postop Assessment: no apparent nausea or vomiting Anesthetic complications: no    Last Vitals:  Vitals:   09/17/18 1115 09/17/18 1148  BP: (!) 147/71 (!) 146/66  Pulse: 82 85  Resp: 19 18  Temp:  36.6 C  SpO2: 96% 96%    Last Pain:  Vitals:   09/17/18 1148  TempSrc:   PainSc: 3                  Lundyn Coste DANIEL

## 2018-09-17 NOTE — Anesthesia Preprocedure Evaluation (Signed)
Anesthesia Evaluation  Patient identified by MRN, date of birth, ID band Patient awake    Reviewed: Allergy & Precautions, H&P , NPO status , Patient's Chart, lab work & pertinent test results  History of Anesthesia Complications Negative for: history of anesthetic complications  Airway Mallampati: II  TM Distance: <3 FB Neck ROM: Limited    Dental  (+) Partial Upper, Dental Advisory Given   Pulmonary neg pulmonary ROS,    breath sounds clear to auscultation       Cardiovascular hypertension, Pt. on medications  Rhythm:regular Rate:Normal     Neuro/Psych    GI/Hepatic   Endo/Other  Morbid obesityobese  Renal/GU      Musculoskeletal  (+) Arthritis ,   Abdominal   Peds  Hematology   Anesthesia Other Findings   Reproductive/Obstetrics                             Anesthesia Physical  Anesthesia Plan  ASA: II  Anesthesia Plan: General   Post-op Pain Management:    Induction: Intravenous  PONV Risk Score and Plan: 3 and Ondansetron, Dexamethasone, Treatment may vary due to age or medical condition and Scopolamine patch - Pre-op  Airway Management Planned: Oral ETT  Additional Equipment:   Intra-op Plan:   Post-operative Plan: Extubation in OR  Informed Consent: I have reviewed the patients History and Physical, chart, labs and discussed the procedure including the risks, benefits and alternatives for the proposed anesthesia with the patient or authorized representative who has indicated his/her understanding and acceptance.     Plan Discussed with: CRNA, Anesthesiologist and Surgeon  Anesthesia Plan Comments:         Anesthesia Quick Evaluation

## 2018-09-17 NOTE — Transfer of Care (Signed)
Immediate Anesthesia Transfer of Care Note  Patient: Kristin Bush  Procedure(s) Performed: LEFT SHOULDER ARTHROSCOPY WITH DEBRIDEMENT, SUBACROMIAL DECOMPRESSION, ROTATOR CUFF (Left Shoulder)  Patient Location: PACU  Anesthesia Type:General and Regional  Level of Consciousness: awake and sedated  Airway & Oxygen Therapy: Patient Spontanous Breathing and Patient connected to face mask oxygen  Post-op Assessment: Report given to RN and Post -op Vital signs reviewed and stable  Post vital signs: Reviewed and stable  Last Vitals:  Vitals Value Taken Time  BP    Temp    Pulse 80 09/17/2018 10:34 AM  Resp 19 09/17/2018 10:34 AM  SpO2 98 % 09/17/2018 10:34 AM  Vitals shown include unvalidated device data.  Last Pain:  Vitals:   09/17/18 0659  TempSrc: Oral  PainSc: 4          Complications: No apparent anesthesia complications

## 2018-09-17 NOTE — Anesthesia Procedure Notes (Signed)
Anesthesia Regional Block: Interscalene brachial plexus block   Pre-Anesthetic Checklist: ,, timeout performed, Correct Patient, Correct Site, Correct Laterality, Correct Procedure, Correct Position, site marked, Risks and benefits discussed,  Surgical consent,  Pre-op evaluation,  At surgeon's request and post-op pain management  Laterality: Left  Prep: chloraprep       Needles:  Injection technique: Single-shot  Needle Type: Echogenic Stimulator Needle     Needle Length: 5cm  Needle Gauge: 22     Additional Needles:   Narrative:  Start time: 09/17/2018 7:32 AM End time: 09/17/2018 7:42 AM Injection made incrementally with aspirations every 5 mL.  Performed by: Personally  Anesthesiologist: Heather Roberts, MD  Additional Notes: Functioning IV was confirmed and monitors applied.  A 22mm 22ga echogenic arrow stimulator was used. Sterile prep and drape,hand hygiene and sterile gloves were used.Ultrasound guidance: relevant anatomy identified, needle position confirmed, local anesthetic spread visualized around nerve(s)., vascular puncture avoided.  Image printed for medical record.  Negative aspiration and negative test dose prior to incremental administration of local anesthetic. The patient tolerated the procedure well.

## 2018-09-17 NOTE — Discharge Instructions (Signed)
Post Anesthesia Home Care Instructions  Activity: Get plenty of rest for the remainder of the day. A responsible individual must stay with you for 24 hours following the procedure.  For the next 24 hours, DO NOT: -Drive a car -Operate machinery -Drink alcoholic beverages -Take any medication unless instructed by your physician -Make any legal decisions or sign important papers.  Meals: Start with liquid foods such as gelatin or soup. Progress to regular foods as tolerated. Avoid greasy, spicy, heavy foods. If nausea and/or vomiting occur, drink only clear liquids until the nausea and/or vomiting subsides. Call your physician if vomiting continues.  Special Instructions/Symptoms: Your throat may feel dry or sore from the anesthesia or the breathing tube placed in your throat during surgery. If this causes discomfort, gargle with warm salt water. The discomfort should disappear within 24 hours.  If you had a scopolamine patch placed behind your ear for the management of post- operative nausea and/or vomiting:  1. The medication in the patch is effective for 72 hours, after which it should be removed.  Wrap patch in a tissue and discard in the trash. Wash hands thoroughly with soap and water. 2. You may remove the patch earlier than 72 hours if you experience unpleasant side effects which may include dry mouth, dizziness or visual disturbances. 3. Avoid touching the patch. Wash your hands with soap and water after contact with the patch.      Regional Anesthesia Blocks  1. Numbness or the inability to move the "blocked" extremity may last from 3-48 hours after placement. The length of time depends on the medication injected and your individual response to the medication. If the numbness is not going away after 48 hours, call your surgeon.  2. The extremity that is blocked will need to be protected until the numbness is gone and the  Strength has returned. Because you cannot feel it, you  will need to take extra care to avoid injury. Because it may be weak, you may have difficulty moving it or using it. You may not know what position it is in without looking at it while the block is in effect.  3. For blocks in the legs and feet, returning to weight bearing and walking needs to be done carefully. You will need to wait until the numbness is entirely gone and the strength has returned. You should be able to move your leg and foot normally before you try and bear weight or walk. You will need someone to be with you when you first try to ensure you do not fall and possibly risk injury.  4. Bruising and tenderness at the needle site are common side effects and will resolve in a few days.  5. Persistent numbness or new problems with movement should be communicated to the surgeon or the [facility] ([phone])/ [facility] ([phone]).  Information for Discharge Teaching: EXPAREL (bupivacaine liposome injectable suspension)   Your surgeon or anesthesiologist gave you EXPAREL(bupivacaine) to help control your pain after surgery.   EXPAREL is a local anesthetic that provides pain relief by numbing the tissue around the surgical site.  EXPAREL is designed to release pain medication over time and can control pain for up to 72 hours.  Depending on how you respond to EXPAREL, you may require less pain medication during your recovery.  Possible side effects:  Temporary loss of sensation or ability to move in the area where bupivacaine was injected.  Nausea, vomiting, constipation  Rarely,   numbness and tingling in your mouth or lips, lightheadedness, or anxiety may occur.  Call your doctor right away if you think you may be experiencing any of these sensations, or if you have other questions regarding possible side effects.  Follow all other discharge instructions given to you by your surgeon or nurse. Eat a healthy diet and drink plenty of water or other  fluids.  If you return to the hospital for any reason within 96 hours following the administration of EXPAREL, it is important for health care providers to know that you have received this anesthetic. A teal colored band has been placed on your arm with the date, time and amount of EXPAREL you have received in order to alert and inform your health care providers. Please leave this armband in place for the full 96 hours following administration, and then you may remove the band. 

## 2018-09-17 NOTE — Progress Notes (Signed)
Assisted Dr. Singer with left, ultrasound guided, interscalene  block. Side rails up, monitors on throughout procedure. See vital signs in flow sheet. Tolerated Procedure well.  

## 2018-09-18 ENCOUNTER — Encounter (HOSPITAL_BASED_OUTPATIENT_CLINIC_OR_DEPARTMENT_OTHER): Payer: Self-pay | Admitting: Orthopaedic Surgery

## 2018-11-10 DIAGNOSIS — M7061 Trochanteric bursitis, right hip: Secondary | ICD-10-CM | POA: Diagnosis not present

## 2018-11-10 DIAGNOSIS — M4316 Spondylolisthesis, lumbar region: Secondary | ICD-10-CM | POA: Diagnosis not present

## 2018-12-17 DIAGNOSIS — I10 Essential (primary) hypertension: Secondary | ICD-10-CM | POA: Diagnosis not present

## 2018-12-17 DIAGNOSIS — Z789 Other specified health status: Secondary | ICD-10-CM | POA: Diagnosis not present

## 2018-12-17 DIAGNOSIS — M1990 Unspecified osteoarthritis, unspecified site: Secondary | ICD-10-CM | POA: Diagnosis not present

## 2018-12-17 DIAGNOSIS — F419 Anxiety disorder, unspecified: Secondary | ICD-10-CM | POA: Diagnosis not present

## 2018-12-17 DIAGNOSIS — Z299 Encounter for prophylactic measures, unspecified: Secondary | ICD-10-CM | POA: Diagnosis not present

## 2018-12-17 DIAGNOSIS — I8393 Asymptomatic varicose veins of bilateral lower extremities: Secondary | ICD-10-CM | POA: Diagnosis not present

## 2018-12-17 DIAGNOSIS — Z6834 Body mass index (BMI) 34.0-34.9, adult: Secondary | ICD-10-CM | POA: Diagnosis not present

## 2019-01-28 ENCOUNTER — Other Ambulatory Visit: Payer: Self-pay | Admitting: Neurological Surgery

## 2019-01-28 DIAGNOSIS — M24112 Other articular cartilage disorders, left shoulder: Secondary | ICD-10-CM | POA: Diagnosis not present

## 2019-01-28 DIAGNOSIS — M545 Low back pain, unspecified: Secondary | ICD-10-CM

## 2019-01-28 DIAGNOSIS — G8929 Other chronic pain: Secondary | ICD-10-CM

## 2019-01-28 DIAGNOSIS — R030 Elevated blood-pressure reading, without diagnosis of hypertension: Secondary | ICD-10-CM | POA: Diagnosis not present

## 2019-01-28 DIAGNOSIS — Z6834 Body mass index (BMI) 34.0-34.9, adult: Secondary | ICD-10-CM | POA: Diagnosis not present

## 2019-02-02 ENCOUNTER — Telehealth: Payer: Self-pay

## 2019-02-02 NOTE — Telephone Encounter (Signed)
Spoke with patient to screen her medications and allergies prior to being scheduled for a myelogram.  She was informed she will be here 2-2.5 hours, will need a driver and will need to be on strict bedrest for 24 hours after the procedure.  She also was informed she needs to hold Tramadol for 48 hours before, and 24 hours after, the myelogram.

## 2019-03-02 ENCOUNTER — Ambulatory Visit
Admission: RE | Admit: 2019-03-02 | Discharge: 2019-03-02 | Disposition: A | Discharge status: Home / Self Care | Payer: Medicare Other | Source: Ambulatory Visit | Attending: Neurological Surgery | Admitting: Neurological Surgery

## 2019-03-02 DIAGNOSIS — M545 Low back pain, unspecified: Secondary | ICD-10-CM

## 2019-03-02 DIAGNOSIS — G8929 Other chronic pain: Secondary | ICD-10-CM

## 2019-03-02 DIAGNOSIS — M48061 Spinal stenosis, lumbar region without neurogenic claudication: Secondary | ICD-10-CM | POA: Diagnosis not present

## 2019-03-02 MED ORDER — DIAZEPAM 5 MG PO TABS
5.0000 mg | ORAL_TABLET | Freq: Once | ORAL | Status: AC
  Administered 2019-03-02: 11:00:00 5 mg via ORAL

## 2019-03-02 MED ORDER — IOPAMIDOL (ISOVUE-M 200) INJECTION 41%
18.0000 mL | Freq: Once | INTRAMUSCULAR | Status: AC
  Administered 2019-03-02: 18 mL via INTRATHECAL

## 2019-03-02 NOTE — Discharge Instructions (Signed)

## 2019-03-04 DIAGNOSIS — M545 Low back pain: Secondary | ICD-10-CM | POA: Diagnosis not present

## 2019-03-31 DIAGNOSIS — M461 Sacroiliitis, not elsewhere classified: Secondary | ICD-10-CM | POA: Diagnosis not present

## 2019-04-06 DIAGNOSIS — M5416 Radiculopathy, lumbar region: Secondary | ICD-10-CM | POA: Diagnosis not present

## 2019-04-06 DIAGNOSIS — M79605 Pain in left leg: Secondary | ICD-10-CM | POA: Diagnosis not present

## 2019-04-06 DIAGNOSIS — R200 Anesthesia of skin: Secondary | ICD-10-CM | POA: Diagnosis not present

## 2019-04-06 DIAGNOSIS — M79604 Pain in right leg: Secondary | ICD-10-CM | POA: Diagnosis not present

## 2019-05-04 DIAGNOSIS — M461 Sacroiliitis, not elsewhere classified: Secondary | ICD-10-CM | POA: Diagnosis not present

## 2019-07-14 DIAGNOSIS — Z6834 Body mass index (BMI) 34.0-34.9, adult: Secondary | ICD-10-CM | POA: Diagnosis not present

## 2019-07-14 DIAGNOSIS — R030 Elevated blood-pressure reading, without diagnosis of hypertension: Secondary | ICD-10-CM | POA: Diagnosis not present

## 2019-07-14 DIAGNOSIS — M461 Sacroiliitis, not elsewhere classified: Secondary | ICD-10-CM | POA: Diagnosis not present

## 2019-08-06 DIAGNOSIS — E7800 Pure hypercholesterolemia, unspecified: Secondary | ICD-10-CM | POA: Diagnosis not present

## 2019-08-06 DIAGNOSIS — Z6835 Body mass index (BMI) 35.0-35.9, adult: Secondary | ICD-10-CM | POA: Diagnosis not present

## 2019-08-06 DIAGNOSIS — Z1211 Encounter for screening for malignant neoplasm of colon: Secondary | ICD-10-CM | POA: Diagnosis not present

## 2019-08-06 DIAGNOSIS — Z1339 Encounter for screening examination for other mental health and behavioral disorders: Secondary | ICD-10-CM | POA: Diagnosis not present

## 2019-08-06 DIAGNOSIS — Z299 Encounter for prophylactic measures, unspecified: Secondary | ICD-10-CM | POA: Diagnosis not present

## 2019-08-06 DIAGNOSIS — Z1331 Encounter for screening for depression: Secondary | ICD-10-CM | POA: Diagnosis not present

## 2019-08-06 DIAGNOSIS — I10 Essential (primary) hypertension: Secondary | ICD-10-CM | POA: Diagnosis not present

## 2019-08-06 DIAGNOSIS — Z7189 Other specified counseling: Secondary | ICD-10-CM | POA: Diagnosis not present

## 2019-08-06 DIAGNOSIS — Z0000 Encounter for general adult medical examination without abnormal findings: Secondary | ICD-10-CM | POA: Diagnosis not present

## 2019-08-06 DIAGNOSIS — Z79899 Other long term (current) drug therapy: Secondary | ICD-10-CM | POA: Diagnosis not present

## 2019-08-06 DIAGNOSIS — F329 Major depressive disorder, single episode, unspecified: Secondary | ICD-10-CM | POA: Diagnosis not present

## 2019-08-06 DIAGNOSIS — R5383 Other fatigue: Secondary | ICD-10-CM | POA: Diagnosis not present

## 2019-10-05 DIAGNOSIS — M7061 Trochanteric bursitis, right hip: Secondary | ICD-10-CM | POA: Diagnosis not present

## 2019-10-05 DIAGNOSIS — M545 Low back pain: Secondary | ICD-10-CM | POA: Diagnosis not present

## 2019-10-25 DIAGNOSIS — M5416 Radiculopathy, lumbar region: Secondary | ICD-10-CM | POA: Diagnosis not present

## 2019-10-27 ENCOUNTER — Other Ambulatory Visit: Payer: Self-pay | Admitting: Orthopedic Surgery

## 2019-11-15 ENCOUNTER — Other Ambulatory Visit: Payer: Self-pay

## 2019-11-15 ENCOUNTER — Encounter (HOSPITAL_COMMUNITY)
Admission: RE | Admit: 2019-11-15 | Discharge: 2019-11-15 | Disposition: A | Discharge status: Home / Self Care | Payer: Medicare Other | Source: Ambulatory Visit | Attending: Orthopedic Surgery | Admitting: Orthopedic Surgery

## 2019-11-15 ENCOUNTER — Other Ambulatory Visit (HOSPITAL_COMMUNITY): Payer: Medicare Other

## 2019-11-15 ENCOUNTER — Other Ambulatory Visit (HOSPITAL_COMMUNITY)
Admission: RE | Admit: 2019-11-15 | Discharge: 2019-11-15 | Disposition: A | Discharge status: Home / Self Care | Payer: Medicare Other | Source: Ambulatory Visit

## 2019-11-15 ENCOUNTER — Encounter (HOSPITAL_COMMUNITY): Payer: Self-pay

## 2019-11-15 DIAGNOSIS — I10 Essential (primary) hypertension: Secondary | ICD-10-CM | POA: Diagnosis not present

## 2019-11-15 DIAGNOSIS — Z20822 Contact with and (suspected) exposure to covid-19: Secondary | ICD-10-CM | POA: Diagnosis not present

## 2019-11-15 DIAGNOSIS — Z01818 Encounter for other preprocedural examination: Secondary | ICD-10-CM | POA: Diagnosis not present

## 2019-11-15 HISTORY — DX: Sacrococcygeal disorders, not elsewhere classified: M53.3

## 2019-11-15 LAB — COMPREHENSIVE METABOLIC PANEL
ALT: 17 U/L (ref 0–44)
AST: 22 U/L (ref 15–41)
Albumin: 4.2 g/dL (ref 3.5–5.0)
Alkaline Phosphatase: 84 U/L (ref 38–126)
Anion gap: 8 (ref 5–15)
BUN: 16 mg/dL (ref 8–23)
CO2: 29 mmol/L (ref 22–32)
Calcium: 9.7 mg/dL (ref 8.9–10.3)
Chloride: 100 mmol/L (ref 98–111)
Creatinine, Ser: 0.87 mg/dL (ref 0.44–1.00)
GFR calc Af Amer: >60 mL/min (ref >60)
GFR calc non Af Amer: >60 mL/min (ref >60)
Glucose, Bld: 119 mg/dL — ABNORMAL HIGH (ref 70–99)
Potassium: 3.8 mmol/L (ref 3.5–5.1)
Sodium: 137 mmol/L (ref 135–145)
Total Bilirubin: 0.4 mg/dL (ref 0.3–1.2)
Total Protein: 8.1 g/dL (ref 6.5–8.1)

## 2019-11-15 LAB — URINALYSIS, ROUTINE W REFLEX MICROSCOPIC
Bilirubin Urine: NEGATIVE
Glucose, UA: NEGATIVE mg/dL
Hgb urine dipstick: NEGATIVE
Ketones, ur: NEGATIVE mg/dL
Nitrite: NEGATIVE
Protein, ur: NEGATIVE mg/dL
Specific Gravity, Urine: 1.015 (ref 1.005–1.030)
pH: 7 (ref 5.0–8.0)

## 2019-11-15 LAB — CBC WITH DIFFERENTIAL/PLATELET
Abs Immature Granulocytes: 0.03 10*3/uL (ref 0.00–0.07)
Basophils Absolute: 0.1 10*3/uL (ref 0.0–0.1)
Basophils Relative: 1 %
Eosinophils Absolute: 0.3 10*3/uL (ref 0.0–0.5)
Eosinophils Relative: 3 %
HCT: 46.3 % — ABNORMAL HIGH (ref 36.0–46.0)
Hemoglobin: 14.6 g/dL (ref 12.0–15.0)
Immature Granulocytes: 0 %
Lymphocytes Relative: 18 %
Lymphs Abs: 1.8 10*3/uL (ref 0.7–4.0)
MCH: 27.5 pg (ref 26.0–34.0)
MCHC: 31.5 g/dL (ref 30.0–36.0)
MCV: 87.4 fL (ref 80.0–100.0)
Monocytes Absolute: 0.8 10*3/uL (ref 0.1–1.0)
Monocytes Relative: 8 %
Neutro Abs: 6.9 10*3/uL (ref 1.7–7.7)
Neutrophils Relative %: 70 %
Platelets: 397 10*3/uL (ref 150–400)
RBC: 5.3 MIL/uL — ABNORMAL HIGH (ref 3.87–5.11)
RDW: 13.3 % (ref 11.5–15.5)
WBC: 9.8 10*3/uL (ref 4.0–10.5)
nRBC: 0 % (ref 0.0–0.2)

## 2019-11-15 LAB — TYPE AND SCREEN
ABO/RH(D): O POS
Antibody Screen: NEGATIVE

## 2019-11-15 LAB — SURGICAL PCR SCREEN
MRSA, PCR: NEGATIVE
Staphylococcus aureus: NEGATIVE

## 2019-11-15 LAB — PROTIME-INR
INR: 1 (ref 0.8–1.2)
Prothrombin Time: 13.4 seconds (ref 11.4–15.2)

## 2019-11-15 LAB — APTT: aPTT: 31 seconds (ref 24–36)

## 2019-11-15 LAB — SARS CORONAVIRUS 2 (TAT 6-24 HRS): SARS Coronavirus 2: NEGATIVE

## 2019-11-15 NOTE — Pre-Procedure Instructions (Signed)
    Kristin Bush  11/15/2019     Your procedure is scheduled on  Thursday, November 18, 2019  Report to [facility] Admitting at 9:00 A.M.  Call this number if you have problems the morning of surgery:  [phone]   Remember:  Do not eat after midnight the night before surgery.   You may drink clear liquids until 9:00 A.M the morning of surgery .  Clear liquids allowed are:  Water, Juice (non-citric and without pulp), Carbonated beverages, Clear Tea, Black Coffee only and Gatorade Please finish the Ensure Pre-Surgery drink by 9:00A.M. ( Do not sip).    Take these medicines the morning of surgery with A SIP OF WATER : If needed: traMADol (ULTRAM) for pain  Stop taking Aspirin (unless otherwise advised by surgeon), vitamins, fish oil and herbal medications. Do not take any NSAIDs ie: Meloxicam ( Mobic), Ibuprofen, Advil, Naproxen (Aleve), Motrin, BC and Goody Powder; stop now.    Do not wear jewelry, make-up or nail polish.  Do not wear lotions, powders, or perfumes, or deodorant.  Do not shave 48 hours prior to surgery.   Do not bring valuables to the hospital.  [facility] is not responsible for any belongings or valuables.  Contacts, dentures or bridgework may not be worn into surgery.  Leave your suitcase in the car.  After surgery it may be brought to your room.  For patients admitted to the hospital, discharge time will be determined by your treatment team.  Patients discharged the day of surgery will not be allowed to drive home.   Special instructions: See "[facility] Preparing For Surgery" sheet.  Please read over the following fact sheets that you were given. Pain Booklet, Coughing and Deep Breathing, Blood Transfusion Information and Surgical Site Infection Prevention

## 2019-11-15 NOTE — Progress Notes (Signed)
Pt denies SOB, chest pain, and being under the care of a cardiologist.  Pt stated that she is under the care of Dr. Sherril Croon, PCP. Pt denies having a stress test, echo and cardiac cath. Pt denies having an EKG and chest x ray within the last year.  Pt denies recent labs. Pt reminded to quarantine. Pt verbalized understanding of all pre-op instructions.  Pt chart forwarded to PA,  Anesthesiology,  to review EKG.

## 2019-11-16 NOTE — Progress Notes (Signed)
Nurse lvm with Lupita Leash, Surgical Coordinator, regarding abnormal UA; awaiting a return call.

## 2019-11-18 ENCOUNTER — Ambulatory Visit (HOSPITAL_COMMUNITY): Payer: Medicare Other | Admitting: Physician Assistant

## 2019-11-18 ENCOUNTER — Ambulatory Visit (HOSPITAL_COMMUNITY): Payer: Medicare Other

## 2019-11-18 ENCOUNTER — Other Ambulatory Visit: Payer: Self-pay

## 2019-11-18 ENCOUNTER — Encounter (HOSPITAL_COMMUNITY)
Admission: RE | Disposition: A | Discharge status: Home / Self Care | Payer: Self-pay | Source: Home / Self Care | Attending: Orthopedic Surgery

## 2019-11-18 ENCOUNTER — Ambulatory Visit (HOSPITAL_COMMUNITY)
Admission: RE | Admit: 2019-11-18 | Discharge: 2019-11-18 | Disposition: A | Discharge status: Home / Self Care | Payer: Medicare Other | Attending: Orthopedic Surgery | Admitting: Orthopedic Surgery

## 2019-11-18 ENCOUNTER — Encounter (HOSPITAL_COMMUNITY): Payer: Self-pay | Admitting: Orthopedic Surgery

## 2019-11-18 DIAGNOSIS — M533 Sacrococcygeal disorders, not elsewhere classified: Secondary | ICD-10-CM | POA: Diagnosis not present

## 2019-11-18 DIAGNOSIS — F329 Major depressive disorder, single episode, unspecified: Secondary | ICD-10-CM | POA: Diagnosis not present

## 2019-11-18 DIAGNOSIS — I10 Essential (primary) hypertension: Secondary | ICD-10-CM | POA: Diagnosis not present

## 2019-11-18 DIAGNOSIS — Z419 Encounter for procedure for purposes other than remedying health state, unspecified: Secondary | ICD-10-CM

## 2019-11-18 DIAGNOSIS — M4328 Fusion of spine, sacral and sacrococcygeal region: Secondary | ICD-10-CM | POA: Diagnosis not present

## 2019-11-18 DIAGNOSIS — M1990 Unspecified osteoarthritis, unspecified site: Secondary | ICD-10-CM | POA: Diagnosis not present

## 2019-11-18 DIAGNOSIS — M545 Low back pain: Secondary | ICD-10-CM | POA: Diagnosis not present

## 2019-11-18 DIAGNOSIS — Z791 Long term (current) use of non-steroidal anti-inflammatories (NSAID): Secondary | ICD-10-CM | POA: Diagnosis not present

## 2019-11-18 DIAGNOSIS — Z981 Arthrodesis status: Secondary | ICD-10-CM

## 2019-11-18 HISTORY — PX: SACROILIAC JOINT FUSION: SHX6088

## 2019-11-18 SURGERY — SACROILIAC JOINT FUSION
Anesthesia: General | Laterality: Right

## 2019-11-18 MED ORDER — ROCURONIUM BROMIDE 10 MG/ML (PF) SYRINGE
PREFILLED_SYRINGE | INTRAVENOUS | Status: DC | PRN
  Administered 2019-11-18: 50 mg via INTRAVENOUS

## 2019-11-18 MED ORDER — PROPOFOL 10 MG/ML IV BOLUS
INTRAVENOUS | Status: DC | PRN
  Administered 2019-11-18: 150 mg via INTRAVENOUS

## 2019-11-18 MED ORDER — DEXAMETHASONE SODIUM PHOSPHATE 10 MG/ML IJ SOLN
INTRAMUSCULAR | Status: DC | PRN
  Administered 2019-11-18: 10 mg via INTRAVENOUS

## 2019-11-18 MED ORDER — ACETAMINOPHEN 500 MG PO TABS
1000.0000 mg | ORAL_TABLET | Freq: Once | ORAL | Status: AC
  Administered 2019-11-18: 09:00:00 1000 mg via ORAL
  Filled 2019-11-18: qty 2

## 2019-11-18 MED ORDER — PROPOFOL 10 MG/ML IV BOLUS
INTRAVENOUS | Status: AC
  Filled 2019-11-18: qty 20

## 2019-11-18 MED ORDER — ARTIFICIAL TEARS OPHTHALMIC OINT
TOPICAL_OINTMENT | OPHTHALMIC | Status: DC | PRN
  Administered 2019-11-18: 1 via OPHTHALMIC

## 2019-11-18 MED ORDER — BUPIVACAINE HCL (PF) 0.25 % IJ SOLN
INTRAMUSCULAR | Status: AC
  Filled 2019-11-18: qty 30

## 2019-11-18 MED ORDER — FENTANYL CITRATE (PF) 100 MCG/2ML IJ SOLN
INTRAMUSCULAR | Status: DC | PRN
  Administered 2019-11-18: 150 ug via INTRAVENOUS
  Administered 2019-11-18: 50 ug via INTRAVENOUS

## 2019-11-18 MED ORDER — ONDANSETRON HCL 4 MG/2ML IJ SOLN
INTRAMUSCULAR | Status: AC
  Filled 2019-11-18: qty 2

## 2019-11-18 MED ORDER — DEXAMETHASONE SODIUM PHOSPHATE 10 MG/ML IJ SOLN
INTRAMUSCULAR | Status: AC
  Filled 2019-11-18: qty 1

## 2019-11-18 MED ORDER — POVIDONE-IODINE 7.5 % EX SOLN
Freq: Once | CUTANEOUS | Status: DC
  Filled 2019-11-18: qty 118

## 2019-11-18 MED ORDER — HYDROCODONE-ACETAMINOPHEN 5-325 MG PO TABS
ORAL_TABLET | ORAL | Status: AC
  Filled 2019-11-18: qty 1

## 2019-11-18 MED ORDER — LIDOCAINE 2% (20 MG/ML) 5 ML SYRINGE
INTRAMUSCULAR | Status: DC | PRN
  Administered 2019-11-18: 40 mg via INTRAVENOUS

## 2019-11-18 MED ORDER — MIDAZOLAM HCL 2 MG/2ML IJ SOLN
INTRAMUSCULAR | Status: AC
  Filled 2019-11-18: qty 2

## 2019-11-18 MED ORDER — CEFAZOLIN SODIUM-DEXTROSE 2-4 GM/100ML-% IV SOLN
2.0000 g | INTRAVENOUS | Status: AC
  Administered 2019-11-18: 2 g via INTRAVENOUS
  Filled 2019-11-18: qty 100

## 2019-11-18 MED ORDER — ONDANSETRON HCL 4 MG/2ML IJ SOLN
INTRAMUSCULAR | Status: DC | PRN
  Administered 2019-11-18: 4 mg via INTRAVENOUS

## 2019-11-18 MED ORDER — HYDROMORPHONE HCL 1 MG/ML IJ SOLN
0.2500 mg | INTRAMUSCULAR | Status: DC | PRN
  Administered 2019-11-18 (×2): 0.5 mg via INTRAVENOUS

## 2019-11-18 MED ORDER — EPINEPHRINE PF 1 MG/ML IJ SOLN
INTRAMUSCULAR | Status: AC
  Filled 2019-11-18: qty 1

## 2019-11-18 MED ORDER — METHOCARBAMOL 500 MG PO TABS
ORAL_TABLET | ORAL | Status: AC
  Filled 2019-11-18: qty 1

## 2019-11-18 MED ORDER — HYDROCODONE-ACETAMINOPHEN 5-325 MG PO TABS
1.0000 | ORAL_TABLET | Freq: Once | ORAL | Status: AC
  Administered 2019-11-18: 14:00:00 1 via ORAL

## 2019-11-18 MED ORDER — BUPIVACAINE-EPINEPHRINE (PF) 0.25% -1:200000 IJ SOLN
INTRAMUSCULAR | Status: DC | PRN
  Administered 2019-11-18: 10 mL via PERINEURAL
  Administered 2019-11-18: 20 mL via PERINEURAL

## 2019-11-18 MED ORDER — METHOCARBAMOL 500 MG PO TABS: 500.0000 mg | ORAL_TABLET | Freq: Four times a day (QID) | ORAL | 0 refills | Status: DC | PRN

## 2019-11-18 MED ORDER — FENTANYL CITRATE (PF) 250 MCG/5ML IJ SOLN
INTRAMUSCULAR | Status: AC
  Filled 2019-11-18: qty 5

## 2019-11-18 MED ORDER — HYDROMORPHONE HCL 1 MG/ML IJ SOLN
INTRAMUSCULAR | Status: AC
  Filled 2019-11-18: qty 1

## 2019-11-18 MED ORDER — OXYCODONE-ACETAMINOPHEN 5-325 MG PO TABS: 1.0000 | ORAL_TABLET | ORAL | 0 refills | Status: AC | PRN

## 2019-11-18 MED ORDER — LIDOCAINE 2% (20 MG/ML) 5 ML SYRINGE
INTRAMUSCULAR | Status: AC
  Filled 2019-11-18: qty 5

## 2019-11-18 MED ORDER — LACTATED RINGERS IV SOLN: INTRAVENOUS | Status: DC

## 2019-11-18 MED ORDER — PHENYLEPHRINE HCL-NACL 10-0.9 MG/250ML-% IV SOLN
INTRAVENOUS | Status: DC | PRN
  Administered 2019-11-18: 30 ug/min via INTRAVENOUS

## 2019-11-18 MED ORDER — BUPIVACAINE LIPOSOME 1.3 % IJ SUSP
20.0000 mL | INTRAMUSCULAR | Status: AC
  Administered 2019-11-18: 20 mL
  Filled 2019-11-18: qty 20

## 2019-11-18 MED ORDER — SUGAMMADEX SODIUM 200 MG/2ML IV SOLN
INTRAVENOUS | Status: DC | PRN
  Administered 2019-11-18: 180 mg via INTRAVENOUS

## 2019-11-18 MED ORDER — METHOCARBAMOL 500 MG PO TABS
500.0000 mg | ORAL_TABLET | Freq: Once | ORAL | Status: AC
  Administered 2019-11-18: 14:00:00 500 mg via ORAL
  Filled 2019-11-18: qty 1

## 2019-11-18 SURGICAL SUPPLY — 66 items
BENZOIN TINCTURE PRP APPL 2/3 (GAUZE/BANDAGES/DRESSINGS) ×3 IMPLANT
BIT DRILL CANNULATED 7.0X3.2MM (BIT) ×1 IMPLANT
BLADE CLIPPER SURG (BLADE) ×3 IMPLANT
BLADE SURG 10 STRL SS (BLADE) ×3 IMPLANT
CANISTER SUCT 3000ML PPV (MISCELLANEOUS) ×3 IMPLANT
CLOSURE WOUND 1/2 X4 (GAUZE/BANDAGES/DRESSINGS) ×1
COVER SURGICAL LIGHT HANDLE (MISCELLANEOUS) ×6 IMPLANT
COVER WAND RF STERILE (DRAPES) ×3 IMPLANT
DRAPE C-ARM 42X72 X-RAY (DRAPES) ×3 IMPLANT
DRAPE C-ARMOR (DRAPES) ×3 IMPLANT
DRAPE INCISE IOBAN 66X45 STRL (DRAPES) ×3 IMPLANT
DRAPE POUCH INSTRU U-SHP 10X18 (DRAPES) ×3 IMPLANT
DRAPE SURG 17X23 STRL (DRAPES) ×12 IMPLANT
DRILL CANNULATED 7.0X3.2MM (BIT) ×3
DURAPREP 26ML APPLICATOR (WOUND CARE) ×3 IMPLANT
ELECT CAUTERY BLADE 6.4 (BLADE) ×3 IMPLANT
ELECT REM PT RETURN 9FT ADLT (ELECTROSURGICAL) ×3
ELECTRODE REM PT RTRN 9FT ADLT (ELECTROSURGICAL) ×1 IMPLANT
GAUZE 4X4 16PLY RFD (DISPOSABLE) ×3 IMPLANT
GAUZE SPONGE 4X4 12PLY STRL (GAUZE/BANDAGES/DRESSINGS) ×3 IMPLANT
GLOVE BIO SURGEON STRL SZ7 (GLOVE) ×3 IMPLANT
GLOVE BIO SURGEON STRL SZ8 (GLOVE) ×3 IMPLANT
GLOVE BIOGEL PI IND STRL 7.0 (GLOVE) ×1 IMPLANT
GLOVE BIOGEL PI IND STRL 8 (GLOVE) ×1 IMPLANT
GLOVE BIOGEL PI INDICATOR 7.0 (GLOVE) ×2
GLOVE BIOGEL PI INDICATOR 8 (GLOVE) ×2
GOWN STRL REUS W/ TWL LRG LVL3 (GOWN DISPOSABLE) ×2 IMPLANT
GOWN STRL REUS W/ TWL XL LVL3 (GOWN DISPOSABLE) ×1 IMPLANT
GOWN STRL REUS W/TWL LRG LVL3 (GOWN DISPOSABLE) ×4
GOWN STRL REUS W/TWL XL LVL3 (GOWN DISPOSABLE) ×2
GUIDE PIN IFUSE DISP 3.2 (PIN) ×3
IMPL IFUSE 7.0MMX45MM (Rod) ×1 IMPLANT
IMPL IFUSE 7.0X50MM (Rod) ×2 IMPLANT
IMPLANT IFUSE 7.0MMX45MM (Rod) ×3 IMPLANT
IMPLANT IFUSE 7.0X50MM (Rod) ×6 IMPLANT
KIT BASIN OR (CUSTOM PROCEDURE TRAY) ×3 IMPLANT
KIT TURNOVER KIT B (KITS) ×3 IMPLANT
MANIFOLD NEPTUNE II (INSTRUMENTS) IMPLANT
NEEDLE 22X1 1/2 (OR ONLY) (NEEDLE) ×3 IMPLANT
NEEDLE HYPO 25GX1X1/2 BEV (NEEDLE) ×3 IMPLANT
NS IRRIG 1000ML POUR BTL (IV SOLUTION) ×3 IMPLANT
PACK UNIVERSAL I (CUSTOM PROCEDURE TRAY) ×6 IMPLANT
PAD ARMBOARD 7.5X6 YLW CONV (MISCELLANEOUS) ×6 IMPLANT
PENCIL BUTTON HOLSTER BLD 10FT (ELECTRODE) ×3 IMPLANT
PIN BLUNT IFUSE DISP 3.2 (PIN) ×3 IMPLANT
PIN EXCHANGE IFUSE DISP 3.2 (PIN) ×3 IMPLANT
PIN GUIDE IFUSE DISP 3.2 (PIN) ×1 IMPLANT
SPONGE LAP 18X18 RF (DISPOSABLE) ×3 IMPLANT
STAPLER SKIN PROX 35W (STAPLE) ×3 IMPLANT
STAPLER VISISTAT 35W (STAPLE) ×3 IMPLANT
STRIP CLOSURE SKIN 1/2X4 (GAUZE/BANDAGES/DRESSINGS) ×2 IMPLANT
SUT MNCRL AB 4-0 PS2 18 (SUTURE) ×3 IMPLANT
SUT VIC AB 0 CT1 18XCR BRD 8 (SUTURE) IMPLANT
SUT VIC AB 0 CT1 8-18 (SUTURE)
SUT VIC AB 1 CT1 18XCR BRD 8 (SUTURE) ×1 IMPLANT
SUT VIC AB 1 CT1 8-18 (SUTURE) ×2
SUT VIC AB 2-0 CT2 18 VCP726D (SUTURE) ×3 IMPLANT
SYR BULB IRRIGATION 50ML (SYRINGE) ×6 IMPLANT
SYR CONTROL 10ML LL (SYRINGE) ×3 IMPLANT
TAPE CLOTH SURG 4X10 WHT LF (GAUZE/BANDAGES/DRESSINGS) IMPLANT
TOWEL GREEN STERILE (TOWEL DISPOSABLE) ×6 IMPLANT
TOWEL GREEN STERILE FF (TOWEL DISPOSABLE) ×3 IMPLANT
TUBE CONNECTING 12'X1/4 (SUCTIONS) ×1
TUBE CONNECTING 12X1/4 (SUCTIONS) ×2 IMPLANT
WATER STERILE IRR 1000ML POUR (IV SOLUTION) ×3 IMPLANT
YANKAUER SUCT BULB TIP NO VENT (SUCTIONS) ×6 IMPLANT

## 2019-11-18 NOTE — Transfer of Care (Signed)
Immediate Anesthesia Transfer of Care Note  Patient: Kristin Bush  Procedure(s) Performed: RIGHT SACROILIAC JOINT FUSION (Right )  Patient Location: PACU  Anesthesia Type:General  Level of Consciousness: awake, alert  and oriented  Airway & Oxygen Therapy: Patient Spontanous Breathing  Post-op Assessment: Report given to RN and Post -op Vital signs reviewed and stable  Post vital signs: Reviewed and stable  Last Vitals:  Vitals Value Taken Time  BP 146/63 11/18/19 1301  Temp 36.4 C 11/18/19 1300  Pulse 79 11/18/19 1305  Resp 17 11/18/19 1305  SpO2 92 % 11/18/19 1305  Vitals shown include unvalidated device data.  Last Pain:  Vitals:   11/18/19 0856  TempSrc:   PainSc: 9          Complications: No apparent anesthesia complications

## 2019-11-18 NOTE — H&P (Signed)
PREOPERATIVE H&P  Chief Complaint: Right-sided low back pain  HPI: Kristin Bush is a 68 y.o. female who presents with ongoing pain in the right low back. Patient's symptoms have been very c/w right SI dysfunctrion.  Patient has failed multiple forms of conservative care and continues to have pain (see office notes for additional details regarding the patient's full course of treatment)  Past Medical History:  Diagnosis Date  . Arthritis   . Depression   . Hypertension   . SI (sacroiliac) joint dysfunction   . Spondylolisthesis   . Wears glasses   . Wears partial dentures    Past Surgical History:  Procedure Laterality Date  . ABDOMINAL HYSTERECTOMY    . BACK SURGERY  05/28/2018   PLIF  . CATARACT EXTRACTION W/PHACO Left 10/06/2014   Procedure: CATARACT EXTRACTION PHACO AND INTRAOCULAR LENS PLACEMENT LEFT EYE;  Surgeon: Tonny Branch, MD;  Location: [facility];  Service: Ophthalmology;  Laterality: Left;  CDE:5.49  . CATARACT EXTRACTION W/PHACO Right 10/17/2014   Procedure: CATARACT EXTRACTION PHACO AND INTRAOCULAR LENS PLACEMENT RIGHT EYE CDE=9.81;  Surgeon: Tonny Branch, MD;  Location: [facility];  Service: Ophthalmology;  Laterality: Right;  . MULTIPLE TOOTH EXTRACTIONS    . ROTATOR CUFF REPAIR     right shoulder  . SHOULDER ARTHROSCOPY WITH SUBACROMIAL DECOMPRESSION, ROTATOR CUFF REPAIR AND BICEP TENDON REPAIR Left 09/17/2018   Procedure: LEFT SHOULDER ARTHROSCOPY WITH DEBRIDEMENT, SUBACROMIAL DECOMPRESSION, ROTATOR CUFF;  Surgeon: Hiram Gash, MD;  Location: [place];  Service: Orthopedics;  Laterality: Left;  . TUBAL LIGATION     Social History   Socioeconomic History  . Marital status: Widowed    Spouse name: Not on file  . Number of children: Not on file  . Years of education: Not on file  . Highest education level: Not on file  Occupational History  . Not on file  Tobacco Use  . Smoking status: Never Smoker  . Smokeless tobacco: Never Used    Substance and Sexual Activity  . Alcohol use: No  . Drug use: No  . Sexual activity: Not on file  Other Topics Concern  . Not on file  Social History Narrative  . Not on file   Social Determinants of Health   Financial Resource Strain:   . Difficulty of Paying Living Expenses:   Food Insecurity:   . Worried About Charity fundraiser in the Last Year:   . Arboriculturist in the Last Year:   Transportation Needs:   . Film/video editor (Medical):   [name] Kitchen Lack of Transportation (Non-Medical):   Physical Activity:   . Days of Exercise per Week:   . Minutes of Exercise per Session:   Stress:   . Feeling of Stress :   Social Connections:   . Frequency of Communication with Friends and Family:   . Frequency of Social Gatherings with Friends and Family:   . Attends Religious Services:   . Active Member of Clubs or Organizations:   . Attends Archivist Meetings:   [name] Kitchen Marital Status:    No family history on file. No Known Allergies Prior to Admission medications   Medication Sig Start Date End Date Taking? Authorizing Provider  meloxicam (MOBIC) 7.5 MG tablet Take 1 tablet (7.5 mg total) by mouth daily. 09/17/18  Yes Hiram Gash, MD  traMADol (ULTRAM) 50 MG tablet Take 50 mg by mouth every 6 (six) hours as needed for moderate pain.  Yes [provider]  triamterene-hydrochlorothiazide (DYAZIDE) 37.5-25 MG capsule Take 1 tablet by mouth daily.    Yes [provider]  cyclobenzaprine (FLEXERIL) 5 MG tablet Take 1 tablet (5 mg total) by mouth 3 (three) times daily as needed for muscle spasms. Patient not taking: Reported on 11/05/2019 05/29/18   Tia Alert, MD  omeprazole (PRILOSEC) 20 MG capsule Take 1 capsule (20 mg total) by mouth daily for 14 days. Patient not taking: Reported on 11/05/2019 09/17/18 10/01/18  Bjorn Pippin, MD     All other systems have been reviewed and were otherwise negative with the exception of those mentioned in the HPI and as  above.  Physical Exam: There were no vitals filed for this visit.  There is no height or weight on file to calculate BMI.  General: Alert, no acute distress Cardiovascular: No pedal edema Respiratory: No cyanosis, no use of accessory musculature Skin: No lesions in the area of chief complaint Neurologic: Sensation intact distally Psychiatric: Patient is competent for consent with normal mood and affect Lymphatic: No axillary or cervical lymphadenopathy  MUSCULOSKELETAL: + TTP right low back  Assessment/Plan: RIGHT SACROILLIAC JOINT DYSFUNCTION Plan for Procedure(s): RIGHT SACROILIAC JOINT FUSION   Jackelyn Hoehn, MD 11/18/2019 7:47 AM

## 2019-11-18 NOTE — Anesthesia Preprocedure Evaluation (Addendum)
Anesthesia Evaluation  Patient identified by MRN, date of birth, ID band Patient awake    Reviewed: Allergy & Precautions, H&P , NPO status , Patient's Chart, lab work & pertinent test results  Airway Mallampati: II  TM Distance: >3 FB Neck ROM: Full    Dental no notable dental hx. (+) Teeth Intact, Dental Advisory Given   Pulmonary neg pulmonary ROS,    Pulmonary exam normal breath sounds clear to auscultation       Cardiovascular hypertension, Pt. on medications negative cardio ROS   Rhythm:Regular Rate:Normal     Neuro/Psych Depression negative neurological ROS  negative psych ROS   GI/Hepatic negative GI ROS, Neg liver ROS,   Endo/Other  negative endocrine ROS  Renal/GU negative Renal ROS  negative genitourinary   Musculoskeletal  (+) Arthritis ,   Abdominal   Peds  Hematology negative hematology ROS (+)   Anesthesia Other Findings   Reproductive/Obstetrics negative OB ROS                            Anesthesia Physical Anesthesia Plan  ASA: II  Anesthesia Plan: General   Post-op Pain Management:    Induction: Intravenous  PONV Risk Score and Plan: 4 or greater and Ondansetron, Dexamethasone and Midazolam  Airway Management Planned: Oral ETT  Additional Equipment:   Intra-op Plan:   Post-operative Plan: Extubation in OR  Informed Consent: I have reviewed the patients History and Physical, chart, labs and discussed the procedure including the risks, benefits and alternatives for the proposed anesthesia with the patient or authorized representative who has indicated his/her understanding and acceptance.     Dental advisory given  Plan Discussed with: CRNA  Anesthesia Plan Comments:         Anesthesia Quick Evaluation

## 2019-11-18 NOTE — Anesthesia Postprocedure Evaluation (Signed)
Anesthesia Post Note  Patient: Kristin Bush  Procedure(s) Performed: RIGHT SACROILIAC JOINT FUSION (Right )     Patient location during evaluation: PACU Anesthesia Type: General Level of consciousness: awake and alert Pain management: pain level controlled Vital Signs Assessment: post-procedure vital signs reviewed and stable Respiratory status: spontaneous breathing, nonlabored ventilation and respiratory function stable Cardiovascular status: blood pressure returned to baseline and stable Postop Assessment: no apparent nausea or vomiting Anesthetic complications: no    Last Vitals:  Vitals:   11/18/19 1400 11/18/19 1405  BP:    Pulse: 82   Resp: 14   Temp:    SpO2: (!) 89% 94%    Last Pain:  Vitals:   11/18/19 1358  TempSrc:   PainSc: 7                  Mylinda Brook,W. EDMOND

## 2019-11-18 NOTE — Anesthesia Procedure Notes (Signed)
Procedure Name: Intubation Date/Time: 11/18/2019 11:40 AM Performed by: Griffin Dakin, CRNA Pre-anesthesia Checklist: Patient identified, Emergency Drugs available, Suction available and Patient being monitored Patient Re-evaluated:Patient Re-evaluated prior to induction Oxygen Delivery Method: Circle system utilized Preoxygenation: Pre-oxygenation with 100% oxygen Induction Type: IV induction Ventilation: Mask ventilation without difficulty and Oral airway inserted - appropriate to patient size Laryngoscope Size: Mac and 3 Grade View: Grade II Tube type: Oral Tube size: 7.0 mm Number of attempts: 1 Airway Equipment and Method: Stylet and Oral airway Placement Confirmation: ETT inserted through vocal cords under direct vision,  positive ETCO2 and breath sounds checked- equal and bilateral Secured at: 22 cm Tube secured with: Tape Dental Injury: Teeth and Oropharynx as per pre-operative assessment

## 2019-11-18 NOTE — Op Note (Addendum)
NAME:  Kristin Bush. Reichl       MEDICAL RECORD NO.:  [id number]   DATE OF BIRTH:  02/12/2018   DATE OF PROCEDURE:  11/18/2019                               OPERATIVE REPORT     PREOPERATIVE DIAGNOSES: 1.  Right sacroiliac joint dysfunction   POSTOPERATIVE DIAGNOSES: 1.  Right sacroiliac joint dysfunction   PROCEDURES: Minimally invasive right sacroiliac joint fusion   SURGEON:  Estill Bamberg, MD.   ASSISTANTJason Coop, PA-C.   ANESTHESIA:  General endotracheal anesthesia.   COMPLICATIONS:  None.   DISPOSITION:  Stable.   ESTIMATED BLOOD LOSS:  Minimal.   INDICATIONS FOR SURGERY:  Briefly, Kristin Bush is a pleasant 69 year old female, who did present to me with ongoing severe pain at the right side of her low back.  Her work-up was diagnostic for right sacroiliac joint dysfunction.  She did fail appropriate conservative treatment measures, and as such, we did discuss proceeding with the surgery noted above.  The patient was made fully aware of the risks and recovery period associated with surgery, and she did wish to proceed.   OPERATIVE DETAILS:  On 11/18/2019, the patient was brought to surgery and general endotracheal anesthesia was administered.  The patient was placed prone on a flat Jackson bed, withdrawal was placed beneath the patient's chest and hips.  The region of the right buttock was prepped and draped in the usual sterile fashion.  A timeout procedure was performed.  Fluoroscopy was brought into the field.  I was able to ensure adequate lateral, inlet, and outlet radiographs.  At this point, a 3 cm incision was made in line with the posterior border of the sacrum on the right.  3 guidewires were advanced across the sacroiliac joint on the right side.  Fluoroscopy was liberally used while advancing the guidewires in order to ensure a safe trajectory of the guidewires..  I then drilled and broached over the guidewires.  At this point, 7 mm implants were advanced across  the sacroiliac joints from superiorly to inferiorly, the length of the implants were 50 mm, 45 mm, and 50 mm.  I was very pleased with the resting position of the implants on lateral, inlet, and outlet fluoroscopy.  The guidewires were then removed.  I was very pleased with the final lateral, inlet, and outlet radiographs.  At this point, the wound was copiously irrigated and closed in layers, using #1 Vicryl, followed by 2-0 Vicryl, followed by 4-0 Monocryl.  All instrument counts were correct at the termination of the procedure.   Of note, Jason Coop was my assistant throughout surgery, and did aid in retraction, suctioning, and closure from start to finish.     Estill Bamberg, MD

## 2019-11-19 ENCOUNTER — Encounter: Payer: Self-pay | Admitting: *Deleted

## 2019-12-01 DIAGNOSIS — Z9889 Other specified postprocedural states: Secondary | ICD-10-CM | POA: Diagnosis not present

## 2019-12-31 DIAGNOSIS — Z9889 Other specified postprocedural states: Secondary | ICD-10-CM | POA: Diagnosis not present

## 2019-12-31 DIAGNOSIS — M533 Sacrococcygeal disorders, not elsewhere classified: Secondary | ICD-10-CM | POA: Diagnosis not present

## 2020-01-04 DIAGNOSIS — Z299 Encounter for prophylactic measures, unspecified: Secondary | ICD-10-CM | POA: Diagnosis not present

## 2020-01-04 DIAGNOSIS — I10 Essential (primary) hypertension: Secondary | ICD-10-CM | POA: Diagnosis not present

## 2020-01-04 DIAGNOSIS — M545 Low back pain: Secondary | ICD-10-CM | POA: Diagnosis not present

## 2020-01-18 DIAGNOSIS — M545 Low back pain: Secondary | ICD-10-CM | POA: Diagnosis not present

## 2020-01-26 DIAGNOSIS — M545 Low back pain: Secondary | ICD-10-CM | POA: Diagnosis not present

## 2020-02-06 DIAGNOSIS — I10 Essential (primary) hypertension: Secondary | ICD-10-CM | POA: Diagnosis not present

## 2020-02-09 DIAGNOSIS — M545 Low back pain: Secondary | ICD-10-CM | POA: Diagnosis not present

## 2020-02-11 DIAGNOSIS — Z9889 Other specified postprocedural states: Secondary | ICD-10-CM | POA: Diagnosis not present

## 2020-02-11 DIAGNOSIS — M533 Sacrococcygeal disorders, not elsewhere classified: Secondary | ICD-10-CM | POA: Diagnosis not present

## 2020-03-08 DIAGNOSIS — I10 Essential (primary) hypertension: Secondary | ICD-10-CM | POA: Diagnosis not present

## 2020-04-05 IMAGING — RF DG LUMBAR SPINE 2-3V
1 series · 3 of 3 positions shown · non-contrast
Comparison: 04/15/2018

CLINICAL DATA: 67-year-old female with lumbar spine surgery

EXAM:
DG C-ARM 61-120 MIN; LUMBAR SPINE - 2-3 VIEW

[Series 1: run · 3 of 3 slices shown]
[im 1/3]
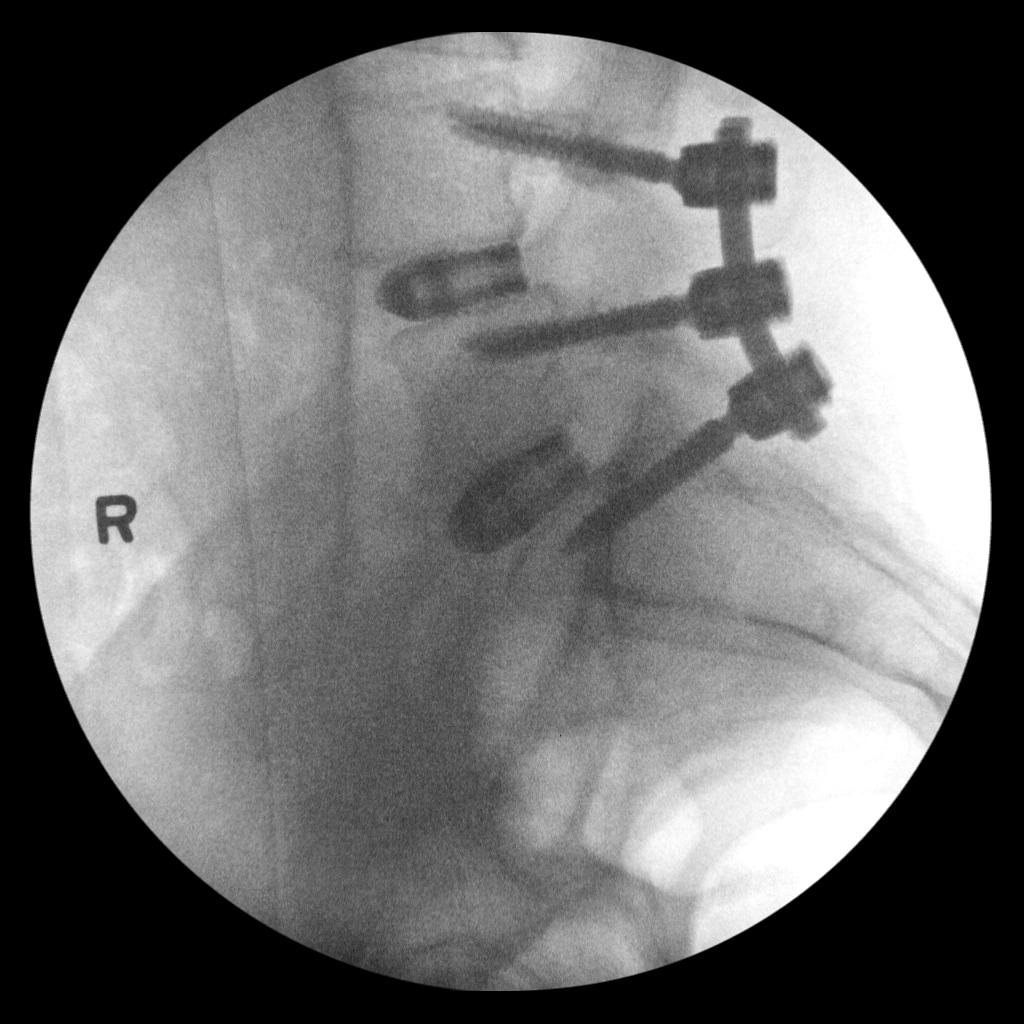
[im 2/3]
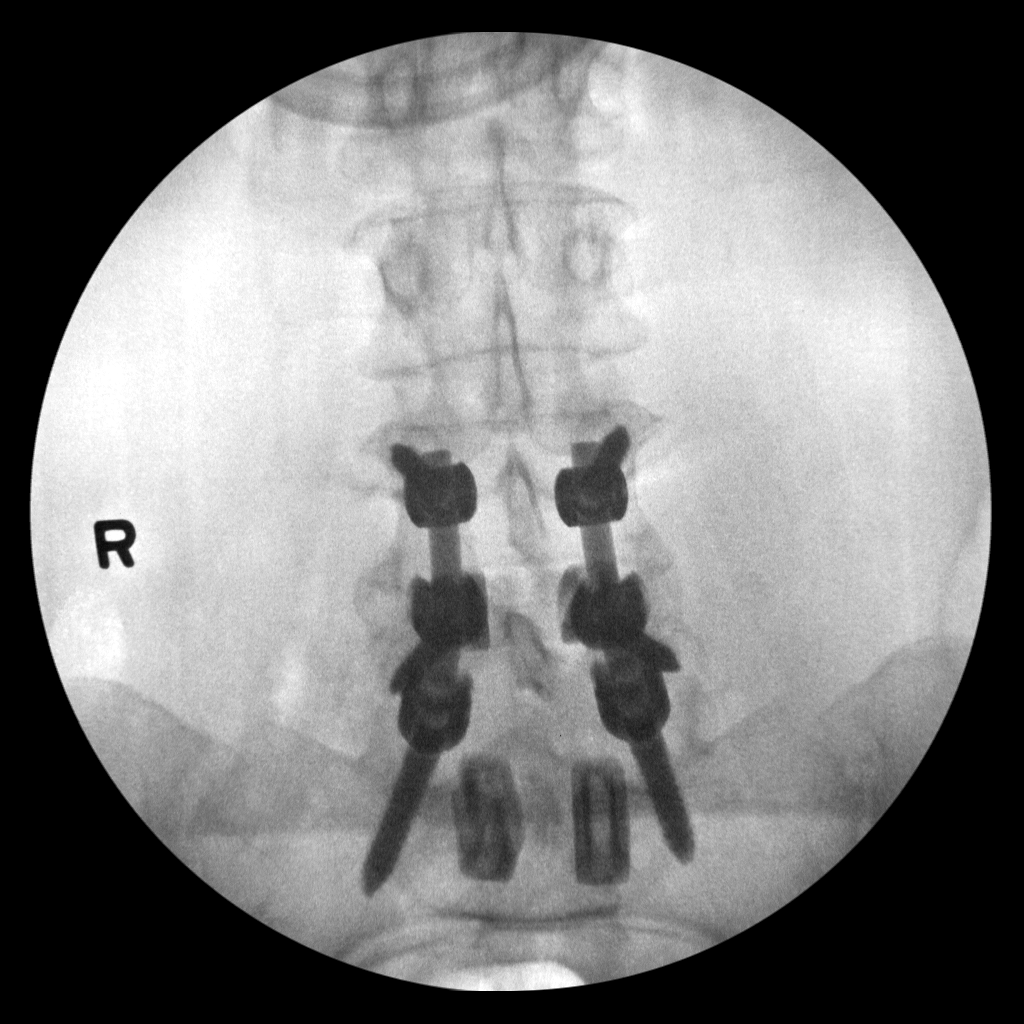
[im 3/3]
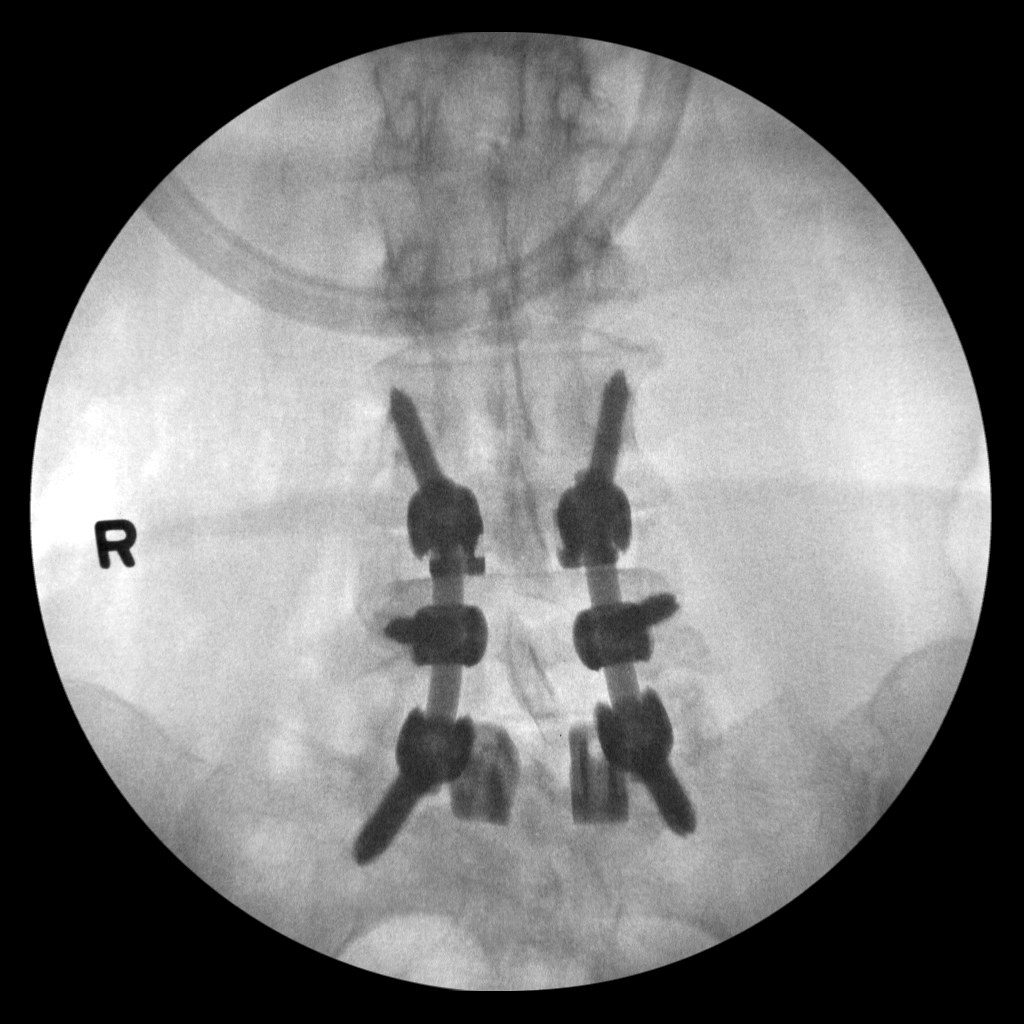

[3 of 3 positions shown; findings below may reference images not displayed]

FINDINGS: Intraoperative fluoroscopic spot images of posterior lumbar
interbody fusion with bilateral pedicle screw and rod fixation
spanning L4-S1. Interbody spacers in place. Anterior view
demonstrates interbody spacers are centered at the L4-L5 and L5-S1
spaces.
IMPRESSION: Limited intraoperative lumbar spine fluoroscopic images of L4-S1
posterior lumbar interbody fusion with bilateral pedicle screw and
rod fixation with no complicating features. Please refer to the
dictated operative report for full details of intraoperative
findings and procedure.

## 2020-04-07 DIAGNOSIS — I10 Essential (primary) hypertension: Secondary | ICD-10-CM | POA: Diagnosis not present

## 2020-05-04 DIAGNOSIS — Z299 Encounter for prophylactic measures, unspecified: Secondary | ICD-10-CM | POA: Diagnosis not present

## 2020-05-04 DIAGNOSIS — I10 Essential (primary) hypertension: Secondary | ICD-10-CM | POA: Diagnosis not present

## 2020-05-04 DIAGNOSIS — Z713 Dietary counseling and surveillance: Secondary | ICD-10-CM | POA: Diagnosis not present

## 2020-05-04 DIAGNOSIS — G510 Bell's palsy: Secondary | ICD-10-CM | POA: Diagnosis not present

## 2020-05-04 DIAGNOSIS — J069 Acute upper respiratory infection, unspecified: Secondary | ICD-10-CM | POA: Diagnosis not present

## 2020-05-09 DIAGNOSIS — I10 Essential (primary) hypertension: Secondary | ICD-10-CM | POA: Diagnosis not present

## 2020-05-17 DIAGNOSIS — F329 Major depressive disorder, single episode, unspecified: Secondary | ICD-10-CM | POA: Diagnosis not present

## 2020-05-17 DIAGNOSIS — Z299 Encounter for prophylactic measures, unspecified: Secondary | ICD-10-CM | POA: Diagnosis not present

## 2020-05-17 DIAGNOSIS — F419 Anxiety disorder, unspecified: Secondary | ICD-10-CM | POA: Diagnosis not present

## 2020-05-17 DIAGNOSIS — R05 Cough: Secondary | ICD-10-CM | POA: Diagnosis not present

## 2020-05-17 DIAGNOSIS — I10 Essential (primary) hypertension: Secondary | ICD-10-CM | POA: Diagnosis not present

## 2020-05-17 DIAGNOSIS — R0789 Other chest pain: Secondary | ICD-10-CM | POA: Diagnosis not present

## 2020-05-25 DIAGNOSIS — Z20822 Contact with and (suspected) exposure to covid-19: Secondary | ICD-10-CM | POA: Diagnosis not present

## 2020-05-25 DIAGNOSIS — R05 Cough: Secondary | ICD-10-CM | POA: Diagnosis not present

## 2020-05-25 DIAGNOSIS — U071 COVID-19: Secondary | ICD-10-CM | POA: Diagnosis not present

## 2020-06-08 DIAGNOSIS — I10 Essential (primary) hypertension: Secondary | ICD-10-CM | POA: Diagnosis not present

## 2020-07-08 DIAGNOSIS — I10 Essential (primary) hypertension: Secondary | ICD-10-CM | POA: Diagnosis not present

## 2020-08-08 DIAGNOSIS — I10 Essential (primary) hypertension: Secondary | ICD-10-CM | POA: Diagnosis not present

## 2020-08-11 DIAGNOSIS — E7800 Pure hypercholesterolemia, unspecified: Secondary | ICD-10-CM | POA: Diagnosis not present

## 2020-08-11 DIAGNOSIS — Z79899 Other long term (current) drug therapy: Secondary | ICD-10-CM | POA: Diagnosis not present

## 2020-08-11 DIAGNOSIS — I10 Essential (primary) hypertension: Secondary | ICD-10-CM | POA: Diagnosis not present

## 2020-08-11 DIAGNOSIS — Z0000 Encounter for general adult medical examination without abnormal findings: Secondary | ICD-10-CM | POA: Diagnosis not present

## 2020-08-11 DIAGNOSIS — Z1331 Encounter for screening for depression: Secondary | ICD-10-CM | POA: Diagnosis not present

## 2020-08-11 DIAGNOSIS — Z7189 Other specified counseling: Secondary | ICD-10-CM | POA: Diagnosis not present

## 2020-08-11 DIAGNOSIS — Z299 Encounter for prophylactic measures, unspecified: Secondary | ICD-10-CM | POA: Diagnosis not present

## 2020-08-11 DIAGNOSIS — R5383 Other fatigue: Secondary | ICD-10-CM | POA: Diagnosis not present

## 2020-08-11 DIAGNOSIS — Z6835 Body mass index (BMI) 35.0-35.9, adult: Secondary | ICD-10-CM | POA: Diagnosis not present

## 2020-08-11 DIAGNOSIS — Z1339 Encounter for screening examination for other mental health and behavioral disorders: Secondary | ICD-10-CM | POA: Diagnosis not present

## 2020-08-11 DIAGNOSIS — Z23 Encounter for immunization: Secondary | ICD-10-CM | POA: Diagnosis not present

## 2020-09-07 DIAGNOSIS — I10 Essential (primary) hypertension: Secondary | ICD-10-CM | POA: Diagnosis not present

## 2020-09-13 DIAGNOSIS — Z23 Encounter for immunization: Secondary | ICD-10-CM | POA: Diagnosis not present

## 2020-10-04 DIAGNOSIS — Z23 Encounter for immunization: Secondary | ICD-10-CM | POA: Diagnosis not present

## 2020-10-09 DIAGNOSIS — I10 Essential (primary) hypertension: Secondary | ICD-10-CM | POA: Diagnosis not present

## 2020-10-24 DIAGNOSIS — R1031 Right lower quadrant pain: Secondary | ICD-10-CM | POA: Diagnosis not present

## 2020-10-24 DIAGNOSIS — I10 Essential (primary) hypertension: Secondary | ICD-10-CM | POA: Diagnosis not present

## 2020-10-24 DIAGNOSIS — Z299 Encounter for prophylactic measures, unspecified: Secondary | ICD-10-CM | POA: Diagnosis not present

## 2020-11-06 DIAGNOSIS — I10 Essential (primary) hypertension: Secondary | ICD-10-CM | POA: Diagnosis not present

## 2020-12-04 DIAGNOSIS — Z1231 Encounter for screening mammogram for malignant neoplasm of breast: Secondary | ICD-10-CM | POA: Diagnosis not present

## 2020-12-07 DIAGNOSIS — I10 Essential (primary) hypertension: Secondary | ICD-10-CM | POA: Diagnosis not present

## 2020-12-07 DIAGNOSIS — R1031 Right lower quadrant pain: Secondary | ICD-10-CM | POA: Insufficient documentation

## 2020-12-07 DIAGNOSIS — Z6832 Body mass index (BMI) 32.0-32.9, adult: Secondary | ICD-10-CM | POA: Diagnosis not present

## 2020-12-11 DIAGNOSIS — I700 Atherosclerosis of aorta: Secondary | ICD-10-CM | POA: Diagnosis not present

## 2020-12-11 DIAGNOSIS — K5730 Diverticulosis of large intestine without perforation or abscess without bleeding: Secondary | ICD-10-CM | POA: Diagnosis not present

## 2020-12-11 DIAGNOSIS — R1031 Right lower quadrant pain: Secondary | ICD-10-CM | POA: Diagnosis not present

## 2020-12-11 DIAGNOSIS — R109 Unspecified abdominal pain: Secondary | ICD-10-CM | POA: Diagnosis not present

## 2020-12-21 DIAGNOSIS — R1031 Right lower quadrant pain: Secondary | ICD-10-CM | POA: Diagnosis not present

## 2021-01-05 DIAGNOSIS — I10 Essential (primary) hypertension: Secondary | ICD-10-CM | POA: Diagnosis not present

## 2021-01-22 DIAGNOSIS — Z299 Encounter for prophylactic measures, unspecified: Secondary | ICD-10-CM | POA: Diagnosis not present

## 2021-01-22 DIAGNOSIS — Z713 Dietary counseling and surveillance: Secondary | ICD-10-CM | POA: Diagnosis not present

## 2021-01-22 DIAGNOSIS — I10 Essential (primary) hypertension: Secondary | ICD-10-CM | POA: Diagnosis not present

## 2021-01-22 DIAGNOSIS — Z6835 Body mass index (BMI) 35.0-35.9, adult: Secondary | ICD-10-CM | POA: Diagnosis not present

## 2021-02-06 DIAGNOSIS — I10 Essential (primary) hypertension: Secondary | ICD-10-CM | POA: Diagnosis not present

## 2021-04-09 DIAGNOSIS — M5416 Radiculopathy, lumbar region: Secondary | ICD-10-CM | POA: Diagnosis not present

## 2021-04-11 ENCOUNTER — Other Ambulatory Visit: Payer: Self-pay | Admitting: Orthopedic Surgery

## 2021-04-11 DIAGNOSIS — M545 Low back pain, unspecified: Secondary | ICD-10-CM

## 2021-04-30 ENCOUNTER — Other Ambulatory Visit: Payer: Medicare Other

## 2021-04-30 ENCOUNTER — Other Ambulatory Visit: Payer: Self-pay

## 2021-04-30 ENCOUNTER — Ambulatory Visit
Admission: RE | Admit: 2021-04-30 | Discharge: 2021-04-30 | Disposition: A | Discharge status: Home / Self Care | Payer: Medicare Other | Source: Ambulatory Visit | Attending: Orthopedic Surgery | Admitting: Orthopedic Surgery

## 2021-04-30 DIAGNOSIS — M48061 Spinal stenosis, lumbar region without neurogenic claudication: Secondary | ICD-10-CM | POA: Diagnosis not present

## 2021-04-30 DIAGNOSIS — M545 Low back pain, unspecified: Secondary | ICD-10-CM

## 2021-05-04 DIAGNOSIS — M5416 Radiculopathy, lumbar region: Secondary | ICD-10-CM | POA: Diagnosis not present

## 2021-05-21 DIAGNOSIS — M5416 Radiculopathy, lumbar region: Secondary | ICD-10-CM | POA: Diagnosis not present

## 2021-05-29 DIAGNOSIS — I10 Essential (primary) hypertension: Secondary | ICD-10-CM | POA: Diagnosis not present

## 2021-05-29 DIAGNOSIS — M545 Low back pain, unspecified: Secondary | ICD-10-CM | POA: Diagnosis not present

## 2021-05-29 DIAGNOSIS — Z299 Encounter for prophylactic measures, unspecified: Secondary | ICD-10-CM | POA: Diagnosis not present

## 2021-05-29 DIAGNOSIS — Z6835 Body mass index (BMI) 35.0-35.9, adult: Secondary | ICD-10-CM | POA: Diagnosis not present

## 2021-05-29 DIAGNOSIS — D692 Other nonthrombocytopenic purpura: Secondary | ICD-10-CM | POA: Diagnosis not present

## 2021-06-21 DIAGNOSIS — M5416 Radiculopathy, lumbar region: Secondary | ICD-10-CM | POA: Diagnosis not present

## 2021-07-06 DIAGNOSIS — M5416 Radiculopathy, lumbar region: Secondary | ICD-10-CM | POA: Diagnosis not present

## 2021-07-11 ENCOUNTER — Other Ambulatory Visit: Payer: Self-pay | Admitting: Orthopedic Surgery

## 2021-07-30 NOTE — Pre-Procedure Instructions (Signed)
Surgical Instructions    Your procedure is scheduled on Thursday 08/09/21.   Report to [facility] Main Entrance "A" at 05:30 A.M., then check in with the Admitting office.  Call this number if you have problems the morning of surgery:  [phone]   If you have any questions prior to your surgery date call [phone]: Open Monday-Friday 8am-4pm    Remember:  Do not eat after midnight the night before your surgery  You may drink clear liquids until 04:30 A.M. the morning of your surgery.   Clear liquids allowed are: Water, Non-Citrus Juices (without pulp), Carbonated Beverages, Clear Tea, Black Coffee ONLY (NO MILK, CREAM OR POWDERED CREAMER of any kind), and Gatorade  Patient Instructions  The night before surgery:  No food after midnight. ONLY clear liquids after midnight  The day of surgery (if you do NOT have diabetes):  Drink ONE (1) Pre-Surgery Clear Ensure by 04:30 A.M. the morning of surgery. Drink in one sitting. Do not sip.  This drink was given to you during your hospital  pre-op appointment visit.  Nothing else to drink after completing the  Pre-Surgery Clear Ensure.         If you have questions, please contact your surgeon's office.     Take these medicines the morning of surgery with A SIP OF WATER   cyclobenzaprine (FLEXERIL)- If needed  hydroxypropyl methylcellulose / hypromellose (ISOPTO TEARS)- If needed  traMADol (ULTRAM)- If needed   As of today, STOP taking any Aspirin (unless otherwise instructed by your surgeon) meloxicam (MOBIC), Aleve, Naproxen, Ibuprofen, Motrin, Advil, Goody's, BC's, all herbal medications, fish oil, and all vitamins.     After your COVID test   You are not required to quarantine however you are required to wear a well-fitting mask when you are out and around people not in your household.  If your mask becomes wet or soiled, replace with a new one.  Wash your hands often with soap and water for 20 seconds or clean your  hands with an alcohol-based hand sanitizer that contains at least 60% alcohol.  Do not share personal items.  Notify your provider: if you are in close contact with someone who has COVID  or if you develop a fever of 100.4 or greater, sneezing, cough, sore throat, shortness of breath or body aches.             Do not wear jewelry or makeup Do not wear lotions, powders, perfumes/colognes, or deodorant. Do not shave 48 hours prior to surgery.  Men may shave face and neck. Do not bring valuables to the hospital. DO Not wear nail polish, gel polish, artificial nails, or any other type of covering on natural nails including finger and toenails. If patients have artificial nails, gel coating, etc. that need to be removed by a nail salon, please have this removed prior to surgery or surgery may need to be canceled/delayed if the surgeon/ anesthesia feels like the patient is unable to be adequately monitored.             [facility] is not responsible for any belongings or valuables.  Do NOT Smoke (Tobacco/Vaping)  24 hours prior to your procedure  If you use a CPAP at night, you may bring your mask for your overnight stay.   Contacts, glasses, hearing aids, dentures or partials may not be worn into surgery, please bring cases for these belongings   For patients admitted to the hospital, discharge time will be determined  by your treatment team.   Patients discharged the day of surgery will not be allowed to drive home, and someone needs to stay with them for 24 hours.  NO VISITORS WILL BE ALLOWED IN PRE-OP WHERE PATIENTS ARE PREPPED FOR SURGERY.  ONLY 1 SUPPORT PERSON MAY BE PRESENT IN THE WAITING ROOM WHILE YOU ARE IN SURGERY.  IF YOU ARE TO BE ADMITTED, ONCE YOU ARE IN YOUR ROOM YOU WILL BE ALLOWED TWO (2) VISITORS. 1 (ONE) VISITOR MAY STAY OVERNIGHT BUT MUST ARRIVE TO THE ROOM BY 8pm.  Minor children may have two parents present. Special consideration for safety and communication needs will  be reviewed on a case by case basis.  Special instructions:    Oral Hygiene is also important to reduce your risk of infection.  Remember - BRUSH YOUR TEETH THE MORNING OF SURGERY WITH YOUR REGULAR TOOTHPASTE   - Preparing For Surgery  Before surgery, you can play an important role. Because skin is not sterile, your skin needs to be as free of germs as possible. You can reduce the number of germs on your skin by washing with CHG (chlorahexidine gluconate) Soap before surgery.  CHG is an antiseptic cleaner which kills germs and bonds with the skin to continue killing germs even after washing.     Please do not use if you have an allergy to CHG or antibacterial soaps. If your skin becomes reddened/irritated stop using the CHG.  Do not shave (including legs and underarms) for at least 48 hours prior to first CHG shower. It is OK to shave your face.  Please follow these instructions carefully.     Shower the NIGHT BEFORE SURGERY and the MORNING OF SURGERY with CHG Soap.   If you chose to wash your hair, wash your hair first as usual with your normal shampoo. After you shampoo, rinse your hair and body thoroughly to remove the shampoo.  Then [employer] and genitals (private parts) with your normal soap and rinse thoroughly to remove soap.  After that Use CHG Soap as you would any other liquid soap. You can apply CHG directly to the skin and wash gently with a scrungie or a clean washcloth.   Apply the CHG Soap to your body ONLY FROM THE NECK DOWN.  Do not use on open wounds or open sores. Avoid contact with your eyes, ears, mouth and genitals (private parts). Wash Face and genitals (private parts)  with your normal soap.   Wash thoroughly, paying special attention to the area where your surgery will be performed.  Thoroughly rinse your body with warm water from the neck down.  DO NOT shower/wash with your normal soap after using and rinsing off the CHG Soap.  Pat yourself dry with  a CLEAN TOWEL.  Wear CLEAN PAJAMAS to bed the night before surgery  Place CLEAN SHEETS on your bed the night before your surgery  DO NOT SLEEP WITH PETS.   Day of Surgery:  Take a shower with CHG soap. Wear Clean/Comfortable clothing the morning of surgery Do not apply any deodorants/lotions.   Remember to brush your teeth WITH YOUR REGULAR TOOTHPASTE.   Please read over the following fact sheets that you were given.

## 2021-07-31 ENCOUNTER — Other Ambulatory Visit: Payer: Self-pay

## 2021-07-31 ENCOUNTER — Encounter (HOSPITAL_COMMUNITY): Payer: Self-pay

## 2021-07-31 ENCOUNTER — Encounter (HOSPITAL_COMMUNITY)
Admission: RE | Admit: 2021-07-31 | Discharge: 2021-07-31 | Disposition: A | Discharge status: Home / Self Care | Payer: Medicare Other | Source: Ambulatory Visit | Attending: Orthopedic Surgery | Admitting: Orthopedic Surgery

## 2021-07-31 VITALS — BP 140/80 | HR 92 | Temp 98.0°F | Resp 18 | Ht 62.0 in | Wt 184.3 lb

## 2021-07-31 DIAGNOSIS — Z981 Arthrodesis status: Secondary | ICD-10-CM | POA: Diagnosis not present

## 2021-07-31 DIAGNOSIS — E669 Obesity, unspecified: Secondary | ICD-10-CM | POA: Insufficient documentation

## 2021-07-31 DIAGNOSIS — M48061 Spinal stenosis, lumbar region without neurogenic claudication: Secondary | ICD-10-CM | POA: Diagnosis not present

## 2021-07-31 DIAGNOSIS — Z01818 Encounter for other preprocedural examination: Secondary | ICD-10-CM | POA: Diagnosis not present

## 2021-07-31 DIAGNOSIS — I10 Essential (primary) hypertension: Secondary | ICD-10-CM | POA: Diagnosis not present

## 2021-07-31 DIAGNOSIS — M5416 Radiculopathy, lumbar region: Secondary | ICD-10-CM | POA: Insufficient documentation

## 2021-07-31 DIAGNOSIS — Z6833 Body mass index (BMI) 33.0-33.9, adult: Secondary | ICD-10-CM | POA: Diagnosis not present

## 2021-07-31 LAB — CBC WITH DIFFERENTIAL/PLATELET
Abs Immature Granulocytes: 0.05 10*3/uL (ref 0.00–0.07)
Basophils Absolute: 0.1 10*3/uL (ref 0.0–0.1)
Basophils Relative: 1 %
Eosinophils Absolute: 0.4 10*3/uL (ref 0.0–0.5)
Eosinophils Relative: 4 %
HCT: 45.7 % (ref 36.0–46.0)
Hemoglobin: 14.7 g/dL (ref 12.0–15.0)
Immature Granulocytes: 1 %
Lymphocytes Relative: 20 %
Lymphs Abs: 1.8 10*3/uL (ref 0.7–4.0)
MCH: 28.3 pg (ref 26.0–34.0)
MCHC: 32.2 g/dL (ref 30.0–36.0)
MCV: 88.1 fL (ref 80.0–100.0)
Monocytes Absolute: 0.9 10*3/uL (ref 0.1–1.0)
Monocytes Relative: 10 %
Neutro Abs: 5.8 10*3/uL (ref 1.7–7.7)
Neutrophils Relative %: 64 %
Platelets: 366 10*3/uL (ref 150–400)
RBC: 5.19 MIL/uL — ABNORMAL HIGH (ref 3.87–5.11)
RDW: 13.3 % (ref 11.5–15.5)
WBC: 8.9 10*3/uL (ref 4.0–10.5)
nRBC: 0 % (ref 0.0–0.2)

## 2021-07-31 LAB — SURGICAL PCR SCREEN
MRSA, PCR: NEGATIVE
Staphylococcus aureus: NEGATIVE

## 2021-07-31 LAB — URINALYSIS, ROUTINE W REFLEX MICROSCOPIC
Bilirubin Urine: NEGATIVE
Glucose, UA: NEGATIVE mg/dL
Hgb urine dipstick: NEGATIVE
Ketones, ur: NEGATIVE mg/dL
Nitrite: NEGATIVE
Protein, ur: NEGATIVE mg/dL
Specific Gravity, Urine: 1.016 (ref 1.005–1.030)
pH: 7 (ref 5.0–8.0)

## 2021-07-31 LAB — COMPREHENSIVE METABOLIC PANEL
ALT: 20 U/L (ref 0–44)
AST: 26 U/L (ref 15–41)
Albumin: 4.1 g/dL (ref 3.5–5.0)
Alkaline Phosphatase: 86 U/L (ref 38–126)
Anion gap: 9 (ref 5–15)
BUN: 17 mg/dL (ref 8–23)
CO2: 28 mmol/L (ref 22–32)
Calcium: 9.1 mg/dL (ref 8.9–10.3)
Chloride: 97 mmol/L — ABNORMAL LOW (ref 98–111)
Creatinine, Ser: 0.87 mg/dL (ref 0.44–1.00)
GFR, Estimated: >60 mL/min (ref >60)
Glucose, Bld: 92 mg/dL (ref 70–99)
Potassium: 3.5 mmol/L (ref 3.5–5.1)
Sodium: 134 mmol/L — ABNORMAL LOW (ref 135–145)
Total Bilirubin: 0.4 mg/dL (ref 0.3–1.2)
Total Protein: 8 g/dL (ref 6.5–8.1)

## 2021-07-31 LAB — PROTIME-INR
INR: 1.1 (ref 0.8–1.2)
Prothrombin Time: 13.9 seconds (ref 11.4–15.2)

## 2021-07-31 LAB — TYPE AND SCREEN
ABO/RH(D): O POS
Antibody Screen: NEGATIVE

## 2021-07-31 LAB — APTT: aPTT: 33 seconds (ref 24–36)

## 2021-07-31 NOTE — Progress Notes (Signed)
PCP - Dr. Sherril Croon Cardiologist - denies  PPM/ICD - n/a Device Orders - n/a Rep Notified - n/a  Chest x-ray - 05/17/20. [facility] Care Everywhere EKG - 07/31/21 Stress Test - denies ECHO - denies Cardiac Cath - denies  Sleep Study - denies CPAP - n/a  Fasting Blood Sugar - n/a Checks Blood Sugar _____ times a day- n/a  Blood Thinner Instructions: n/a Aspirin Instructions: n/a  ERAS Protcol - Yes PRE-SURGERY Ensure or G2- Ensure  COVID TEST- Day of surgery. Patient lives in [place] and is unable to come back to [place] prior to surgery. Ok per management.    Anesthesia review: Yes. EKG review.   Patient denies shortness of breath, fever, cough and chest pain at PAT appointment   All instructions explained to the patient, with a verbal understanding of the material. Patient agrees to go over the instructions while at home for a better understanding. Patient also instructed to self quarantine after being tested for COVID-19. The opportunity to ask questions was provided.

## 2021-08-01 NOTE — Anesthesia Preprocedure Evaluation (Addendum)
Anesthesia Evaluation  Patient identified by MRN, date of birth, ID band Patient awake    Reviewed: Allergy & Precautions, NPO status , Patient's Chart, lab work & pertinent test results  History of Anesthesia Complications Negative for: history of anesthetic complications  Airway Mallampati: II  TM Distance: >3 FB Neck ROM: Full    Dental  (+) Dental Advisory Given, Partial Upper, Missing   Pulmonary neg pulmonary ROS,    breath sounds clear to auscultation       Cardiovascular hypertension, Pt. on medications (-) angina Rhythm:Regular Rate:Normal     Neuro/Psych Depression Chronic back pain: tramadol    GI/Hepatic negative GI ROS, Neg liver ROS,   Endo/Other  obese  Renal/GU negative Renal ROS     Musculoskeletal   Abdominal (+) + obese,   Peds  Hematology negative hematology ROS (+)   Anesthesia Other Findings   Reproductive/Obstetrics                           Anesthesia Physical Anesthesia Plan  ASA: 2  Anesthesia Plan: General   Post-op Pain Management: Tylenol PO (pre-op) and Dilaudid IV   Induction: Intravenous  PONV Risk Score and Plan: 3 and Ondansetron, Dexamethasone and Treatment may vary due to age or medical condition  Airway Management Planned: Oral ETT  Additional Equipment: None  Intra-op Plan:   Post-operative Plan: Extubation in OR  Informed Consent: I have reviewed the patients History and Physical, chart, labs and discussed the procedure including the risks, benefits and alternatives for the proposed anesthesia with the patient or authorized representative who has indicated his/her understanding and acceptance.     Dental advisory given  Plan Discussed with: CRNA and Surgeon  Anesthesia Plan Comments: (PAT note written 08/01/2021 by Shonna Chock, PA-C. )      Anesthesia Quick Evaluation

## 2021-08-01 NOTE — Progress Notes (Signed)
Anesthesia Chart Review:  Case: [id number] Date/Time: 08/09/21 0715   Procedure: RIGHT-SIDED LUMBAR 3 - LUMBAR 4 TRANSFORAMINAL LUMBAR INTERBODY FUSION WITH INSTRUMENTATION AND ALLOGRAFT (Right)   Anesthesia type: General   Pre-op diagnosis: RIGHT-SIDED FORAMINAL STENOSIS   [facility] / [facility]   Surgeons: Estill Bamberg, MD       DISCUSSION: Patient is a 70 year old female scheduled for the above procedure.  History includes never smoker, HTN, spinal surgery (L4-S1 PLIF 05/28/18), right SI joint fusion (11/18/19). BMI is consistent with obesity.   Preoperative labs and EKG reviewed. EKG appears stable since at least 2016. She denied SOB, cough, fever, chest pain at PAT RN visit. She lives in [place], so day of surgery COVID-19 testing is planned. Anesthesia team to evaluate on the day of surgery.   VS: BP 140/80   Pulse 92   Temp 36.7 C (Oral)   Resp 18   Ht 5\' 2"  (1.575 m)   Wt 83.6 kg   SpO2 99%   BMI 33.71 kg/m    PROVIDERS: , MD is PCP    LABS: Labs reviewed: Acceptable for surgery. (all labs ordered are listed, but only abnormal results are displayed)  Labs Reviewed  CBC WITH DIFFERENTIAL/PLATELET - Abnormal; Notable for the following components:      Result Value   RBC 5.19 (*)    All other components within normal limits  COMPREHENSIVE METABOLIC PANEL - Abnormal; Notable for the following components:   Sodium 134 (*)    Chloride 97 (*)    All other components within normal limits  URINALYSIS, ROUTINE W REFLEX MICROSCOPIC - Abnormal; Notable for the following components:   Leukocytes,Ua TRACE (*)    Bacteria, UA RARE (*)    All other components within normal limits  SURGICAL PCR SCREEN  PROTIME-INR  APTT  TYPE AND SCREEN     IMAGES: MRI L-spine 04/30/21: IMPRESSION: 1. Adjacent segment degeneration at L3-4 causing compressive spinal and right foraminal stenosis that is progressed from the 2020 myelogram. 2. Noncompressive degenerative  changes above this level are similar to prior. 3. L4-S1 PLIF.   EKG: 07/31/21:  Normal sinus rhythm Left axis deviation Inferior infarct , age undetermined Anterolateral infarct , age undetermined Abnormal ECG No significant change since last tracing Confirmed by 08/02/21 (Dietrich Pates) on 07/31/2021 10:37:13 PM - EKG appears stable when compared to multiple tracings since at least 09/29/14.   CV: N/A  Past Medical History:  Diagnosis Date   Arthritis    Depression    Hypertension    SI (sacroiliac) joint dysfunction    Spondylolisthesis    Wears glasses    Wears partial dentures     Past Surgical History:  Procedure Laterality Date   ABDOMINAL HYSTERECTOMY     BACK SURGERY  05/28/2018   PLIF   CATARACT EXTRACTION W/PHACO Left 10/06/2014   Procedure: CATARACT EXTRACTION PHACO AND INTRAOCULAR LENS PLACEMENT LEFT EYE;  Surgeon: 10/08/2014, MD;  Location: [facility];  Service: Ophthalmology;  Laterality: Left;  CDE:5.49   CATARACT EXTRACTION W/PHACO Right 10/17/2014   Procedure: CATARACT EXTRACTION PHACO AND INTRAOCULAR LENS PLACEMENT RIGHT EYE CDE=9.81;  Surgeon: 12/16/2014, MD;  Location: [facility];  Service: Ophthalmology;  Laterality: Right;   MULTIPLE TOOTH EXTRACTIONS     ROTATOR CUFF REPAIR     right shoulder   SACROILIAC JOINT FUSION Right 11/18/2019   Procedure: RIGHT SACROILIAC JOINT FUSION;  Surgeon: 01/18/2020, MD;  Location: [facility];  Service:  Orthopedics;  Laterality: Right;   SHOULDER ARTHROSCOPY WITH SUBACROMIAL DECOMPRESSION, ROTATOR CUFF REPAIR AND BICEP TENDON REPAIR Left 09/17/2018   Procedure: LEFT SHOULDER ARTHROSCOPY WITH DEBRIDEMENT, SUBACROMIAL DECOMPRESSION, ROTATOR CUFF;  Surgeon: Bjorn Pippin, MD;  Location: [facility];  Service: Orthopedics;  Laterality: Left;   TUBAL LIGATION      MEDICATIONS:  cyclobenzaprine (FLEXERIL) 5 MG tablet   Homeopathic Products (PROSACEA) GEL   hydroxypropyl methylcellulose / hypromellose (ISOPTO TEARS /  GONIOVISC) 2.5 % ophthalmic solution   meloxicam (MOBIC) 7.5 MG tablet   methocarbamol (ROBAXIN) 500 MG tablet   traMADol (ULTRAM) 50 MG tablet   triamterene-hydrochlorothiazide (DYAZIDE) 37.5-25 MG capsule   No current facility-administered medications for this encounter.    Shonna Chock, PA-C Surgical Short Stay/Anesthesiology [facility] Phone [phone] [facility] Phone [phone] 08/01/2021 12:35 PM

## 2021-08-09 ENCOUNTER — Inpatient Hospital Stay (HOSPITAL_COMMUNITY): Payer: Medicare Other

## 2021-08-09 ENCOUNTER — Other Ambulatory Visit: Payer: Self-pay

## 2021-08-09 ENCOUNTER — Inpatient Hospital Stay (HOSPITAL_COMMUNITY)
Admission: RE | Admit: 2021-08-09 | Discharge: 2021-08-10 | DRG: 454 | Disposition: A | Discharge status: Home / Self Care | Payer: Medicare Other | Attending: Orthopedic Surgery | Admitting: Orthopedic Surgery

## 2021-08-09 ENCOUNTER — Encounter (HOSPITAL_COMMUNITY)
Admission: RE | Disposition: A | Discharge status: Home / Self Care | Payer: Self-pay | Source: Home / Self Care | Attending: Orthopedic Surgery

## 2021-08-09 ENCOUNTER — Encounter (HOSPITAL_COMMUNITY): Payer: Self-pay | Admitting: Orthopedic Surgery

## 2021-08-09 ENCOUNTER — Inpatient Hospital Stay (HOSPITAL_COMMUNITY): Payer: Medicare Other | Admitting: Anesthesiology

## 2021-08-09 ENCOUNTER — Inpatient Hospital Stay (HOSPITAL_COMMUNITY): Payer: Medicare Other | Admitting: Physician Assistant

## 2021-08-09 DIAGNOSIS — M960 Pseudarthrosis after fusion or arthrodesis: Secondary | ICD-10-CM | POA: Diagnosis not present

## 2021-08-09 DIAGNOSIS — M5416 Radiculopathy, lumbar region: Secondary | ICD-10-CM | POA: Diagnosis not present

## 2021-08-09 DIAGNOSIS — T8489XA Other specified complication of internal orthopedic prosthetic devices, implants and grafts, initial encounter: Secondary | ICD-10-CM | POA: Diagnosis present

## 2021-08-09 DIAGNOSIS — F32A Depression, unspecified: Secondary | ICD-10-CM | POA: Diagnosis present

## 2021-08-09 DIAGNOSIS — Z90710 Acquired absence of both cervix and uterus: Secondary | ICD-10-CM

## 2021-08-09 DIAGNOSIS — Z791 Long term (current) use of non-steroidal anti-inflammatories (NSAID): Secondary | ICD-10-CM | POA: Diagnosis not present

## 2021-08-09 DIAGNOSIS — Z981 Arthrodesis status: Secondary | ICD-10-CM | POA: Diagnosis not present

## 2021-08-09 DIAGNOSIS — Z20822 Contact with and (suspected) exposure to covid-19: Secondary | ICD-10-CM | POA: Diagnosis not present

## 2021-08-09 DIAGNOSIS — Z01818 Encounter for other preprocedural examination: Secondary | ICD-10-CM | POA: Diagnosis not present

## 2021-08-09 DIAGNOSIS — M4326 Fusion of spine, lumbar region: Secondary | ICD-10-CM | POA: Diagnosis not present

## 2021-08-09 DIAGNOSIS — I10 Essential (primary) hypertension: Secondary | ICD-10-CM | POA: Diagnosis not present

## 2021-08-09 DIAGNOSIS — M5410 Radiculopathy, site unspecified: Secondary | ICD-10-CM | POA: Diagnosis present

## 2021-08-09 DIAGNOSIS — Z419 Encounter for procedure for purposes other than remedying health state, unspecified: Secondary | ICD-10-CM

## 2021-08-09 DIAGNOSIS — Z79899 Other long term (current) drug therapy: Secondary | ICD-10-CM

## 2021-08-09 DIAGNOSIS — M48061 Spinal stenosis, lumbar region without neurogenic claudication: Secondary | ICD-10-CM | POA: Diagnosis present

## 2021-08-09 HISTORY — PX: TRANSFORAMINAL LUMBAR INTERBODY FUSION (TLIF) WITH PEDICLE SCREW FIXATION 1 LEVEL: SHX6141

## 2021-08-09 LAB — SARS CORONAVIRUS 2 BY RT PCR (HOSPITAL ORDER, PERFORMED IN ~~LOC~~ HOSPITAL LAB): SARS Coronavirus 2: NEGATIVE

## 2021-08-09 SURGERY — TRANSFORAMINAL LUMBAR INTERBODY FUSION (TLIF) WITH PEDICLE SCREW FIXATION 1 LEVEL
Anesthesia: General | Laterality: Right

## 2021-08-09 MED ORDER — OXYCODONE-ACETAMINOPHEN 5-325 MG PO TABS
ORAL_TABLET | ORAL | Status: AC
  Filled 2021-08-09: qty 2

## 2021-08-09 MED ORDER — ONDANSETRON HCL 4 MG PO TABS: 4.0000 mg | ORAL_TABLET | Freq: Four times a day (QID) | ORAL | Status: DC | PRN

## 2021-08-09 MED ORDER — OXYCODONE HCL 5 MG PO TABS: 5.0000 mg | ORAL_TABLET | Freq: Once | ORAL | Status: DC | PRN

## 2021-08-09 MED ORDER — ROCURONIUM BROMIDE 10 MG/ML (PF) SYRINGE
PREFILLED_SYRINGE | INTRAVENOUS | Status: DC | PRN
  Administered 2021-08-09 (×2): 10 mg via INTRAVENOUS
  Administered 2021-08-09: 40 mg via INTRAVENOUS
  Administered 2021-08-09: 60 mg via INTRAVENOUS

## 2021-08-09 MED ORDER — ONDANSETRON HCL 4 MG/2ML IJ SOLN: 4.0000 mg | Freq: Four times a day (QID) | INTRAMUSCULAR | Status: DC | PRN

## 2021-08-09 MED ORDER — EPHEDRINE SULFATE-NACL 50-0.9 MG/10ML-% IV SOSY
PREFILLED_SYRINGE | INTRAVENOUS | Status: DC | PRN
  Administered 2021-08-09 (×5): 5 mg via INTRAVENOUS

## 2021-08-09 MED ORDER — PHENOL 1.4 % MT LIQD: 1.0000 | OROMUCOSAL | Status: DC | PRN

## 2021-08-09 MED ORDER — OXYCODONE-ACETAMINOPHEN 5-325 MG PO TABS
1.0000 | ORAL_TABLET | ORAL | Status: DC | PRN
Start: 2021-08-09 — End: 2021-08-10
  Administered 2021-08-09 – 2021-08-10 (×4): 2 via ORAL
  Filled 2021-08-09 (×3): qty 2

## 2021-08-09 MED ORDER — TRIAMTERENE-HCTZ 37.5-25 MG PO CAPS
1.0000 | ORAL_CAPSULE | Freq: Every day | ORAL | Status: DC
  Filled 2021-08-09: qty 1

## 2021-08-09 MED ORDER — ONDANSETRON HCL 4 MG/2ML IJ SOLN
INTRAMUSCULAR | Status: DC | PRN
  Administered 2021-08-09: 4 mg via INTRAVENOUS

## 2021-08-09 MED ORDER — PROPOFOL 10 MG/ML IV BOLUS
INTRAVENOUS | Status: DC | PRN
  Administered 2021-08-09: 100 mg via INTRAVENOUS

## 2021-08-09 MED ORDER — MIDAZOLAM HCL 2 MG/2ML IJ SOLN
INTRAMUSCULAR | Status: DC | PRN
  Administered 2021-08-09 (×2): 1 mg via INTRAVENOUS

## 2021-08-09 MED ORDER — CEFAZOLIN SODIUM-DEXTROSE 2-4 GM/100ML-% IV SOLN
2.0000 g | INTRAVENOUS | Status: AC
  Administered 2021-08-09 (×2): 2 g via INTRAVENOUS
  Filled 2021-08-09: qty 100

## 2021-08-09 MED ORDER — OXYCODONE-ACETAMINOPHEN 5-325 MG PO TABS
1.0000 | ORAL_TABLET | ORAL | 0 refills | Status: DC | PRN
Start: 2021-08-09 — End: 2023-07-01

## 2021-08-09 MED ORDER — DOCUSATE SODIUM 100 MG PO CAPS
100.0000 mg | ORAL_CAPSULE | Freq: Two times a day (BID) | ORAL | Status: DC
  Administered 2021-08-09 – 2021-08-10 (×3): 100 mg via ORAL
  Filled 2021-08-09 (×3): qty 1

## 2021-08-09 MED ORDER — THROMBIN 20000 UNITS EX SOLR
CUTANEOUS | Status: AC
  Filled 2021-08-09: qty 20000

## 2021-08-09 MED ORDER — ROCURONIUM BROMIDE 10 MG/ML (PF) SYRINGE
PREFILLED_SYRINGE | INTRAVENOUS | Status: AC
  Filled 2021-08-09: qty 10

## 2021-08-09 MED ORDER — POTASSIUM CHLORIDE IN NACL 20-0.9 MEQ/L-% IV SOLN: INTRAVENOUS | Status: DC

## 2021-08-09 MED ORDER — MEPERIDINE HCL 25 MG/ML IJ SOLN: 6.2500 mg | INTRAMUSCULAR | Status: DC | PRN

## 2021-08-09 MED ORDER — MENTHOL 3 MG MT LOZG: 1.0000 | LOZENGE | OROMUCOSAL | Status: DC | PRN

## 2021-08-09 MED ORDER — PHENYLEPHRINE 40 MCG/ML (10ML) SYRINGE FOR IV PUSH (FOR BLOOD PRESSURE SUPPORT)
PREFILLED_SYRINGE | INTRAVENOUS | Status: DC | PRN
  Administered 2021-08-09 (×5): 80 ug via INTRAVENOUS

## 2021-08-09 MED ORDER — CEFAZOLIN SODIUM-DEXTROSE 2-4 GM/100ML-% IV SOLN
2.0000 g | Freq: Three times a day (TID) | INTRAVENOUS | Status: AC
  Administered 2021-08-09 – 2021-08-10 (×2): 2 g via INTRAVENOUS
  Filled 2021-08-09 (×2): qty 100

## 2021-08-09 MED ORDER — TRIAMTERENE-HCTZ 37.5-25 MG PO TABS
1.0000 | ORAL_TABLET | Freq: Every day | ORAL | Status: DC
Start: 2021-08-09 — End: 2021-08-10
  Administered 2021-08-09 – 2021-08-10 (×2): 1 via ORAL
  Filled 2021-08-09 (×2): qty 1

## 2021-08-09 MED ORDER — SUGAMMADEX SODIUM 200 MG/2ML IV SOLN
INTRAVENOUS | Status: DC | PRN
  Administered 2021-08-09: 200 mg via INTRAVENOUS
  Administered 2021-08-09: 100 mg via INTRAVENOUS

## 2021-08-09 MED ORDER — POLYVINYL ALCOHOL 1.4 % OP SOLN
1.0000 [drp] | Freq: Three times a day (TID) | OPHTHALMIC | Status: DC | PRN
  Filled 2021-08-09: qty 15

## 2021-08-09 MED ORDER — BUPIVACAINE-EPINEPHRINE 0.25% -1:200000 IJ SOLN
INTRAMUSCULAR | Status: DC | PRN
  Administered 2021-08-09: 30 mL

## 2021-08-09 MED ORDER — MORPHINE SULFATE (PF) 2 MG/ML IV SOLN
1.0000 mg | INTRAVENOUS | Status: DC | PRN
  Administered 2021-08-09: 2 mg via INTRAVENOUS
  Filled 2021-08-09: qty 1

## 2021-08-09 MED ORDER — POVIDONE-IODINE 7.5 % EX SOLN: Freq: Once | CUTANEOUS | Status: DC

## 2021-08-09 MED ORDER — ACETAMINOPHEN 650 MG RE SUPP: 650.0000 mg | RECTAL | Status: DC | PRN

## 2021-08-09 MED ORDER — ORAL CARE MOUTH RINSE: 15.0000 mL | Freq: Once | OROMUCOSAL | Status: AC

## 2021-08-09 MED ORDER — SODIUM CHLORIDE 0.9% FLUSH: 3.0000 mL | INTRAVENOUS | Status: DC | PRN

## 2021-08-09 MED ORDER — HYDROCODONE-ACETAMINOPHEN 5-325 MG PO TABS: 1.0000 | ORAL_TABLET | ORAL | Status: DC | PRN

## 2021-08-09 MED ORDER — SODIUM CHLORIDE 0.9% FLUSH
3.0000 mL | Freq: Two times a day (BID) | INTRAVENOUS | Status: DC
  Administered 2021-08-09 (×2): 3 mL via INTRAVENOUS

## 2021-08-09 MED ORDER — LACTATED RINGERS IV SOLN: INTRAVENOUS | Status: DC

## 2021-08-09 MED ORDER — ACETAMINOPHEN 325 MG PO TABS: 650.0000 mg | ORAL_TABLET | ORAL | Status: DC | PRN

## 2021-08-09 MED ORDER — PROPOFOL 10 MG/ML IV BOLUS
INTRAVENOUS | Status: AC
  Filled 2021-08-09: qty 20

## 2021-08-09 MED ORDER — ACETAMINOPHEN 500 MG PO TABS
1000.0000 mg | ORAL_TABLET | Freq: Once | ORAL | Status: AC
  Administered 2021-08-09: 1000 mg via ORAL
  Filled 2021-08-09: qty 2

## 2021-08-09 MED ORDER — FLEET ENEMA 7-19 GM/118ML RE ENEM: 1.0000 | ENEMA | Freq: Once | RECTAL | Status: DC | PRN

## 2021-08-09 MED ORDER — OXYCODONE HCL 5 MG/5ML PO SOLN: 5.0000 mg | Freq: Once | ORAL | Status: DC | PRN

## 2021-08-09 MED ORDER — CEFAZOLIN SODIUM 1 G IJ SOLR
INTRAMUSCULAR | Status: AC
  Filled 2021-08-09: qty 20

## 2021-08-09 MED ORDER — BUPIVACAINE LIPOSOME 1.3 % IJ SUSP
INTRAMUSCULAR | Status: AC
  Filled 2021-08-09: qty 20

## 2021-08-09 MED ORDER — BUPIVACAINE LIPOSOME 1.3 % IJ SUSP
INTRAMUSCULAR | Status: DC | PRN
  Administered 2021-08-09: 20 mL

## 2021-08-09 MED ORDER — HYDROMORPHONE HCL 1 MG/ML IJ SOLN
0.2500 mg | INTRAMUSCULAR | Status: DC | PRN
  Administered 2021-08-09: 0.5 mg via INTRAVENOUS
  Administered 2021-08-09 (×2): 0.25 mg via INTRAVENOUS

## 2021-08-09 MED ORDER — THROMBIN 20000 UNITS EX SOLR
CUTANEOUS | Status: DC | PRN
  Administered 2021-08-09: 20000 [IU] via TOPICAL

## 2021-08-09 MED ORDER — CHLORHEXIDINE GLUCONATE 0.12 % MT SOLN
15.0000 mL | Freq: Once | OROMUCOSAL | Status: AC
  Administered 2021-08-09: 15 mL via OROMUCOSAL
  Filled 2021-08-09: qty 15

## 2021-08-09 MED ORDER — BUPIVACAINE-EPINEPHRINE (PF) 0.25% -1:200000 IJ SOLN
INTRAMUSCULAR | Status: AC
  Filled 2021-08-09: qty 30

## 2021-08-09 MED ORDER — PHENYLEPHRINE HCL-NACL 20-0.9 MG/250ML-% IV SOLN
INTRAVENOUS | Status: DC | PRN
  Administered 2021-08-09: 100 ug/min via INTRAVENOUS

## 2021-08-09 MED ORDER — PROMETHAZINE HCL 25 MG/ML IJ SOLN: 6.2500 mg | INTRAMUSCULAR | Status: DC | PRN

## 2021-08-09 MED ORDER — CYCLOBENZAPRINE HCL 5 MG PO TABS
5.0000 mg | ORAL_TABLET | Freq: Three times a day (TID) | ORAL | Status: DC | PRN
Start: 2021-08-09 — End: 2021-08-10
  Administered 2021-08-09: 10 mg via ORAL
  Filled 2021-08-09: qty 2

## 2021-08-09 MED ORDER — SENNOSIDES-DOCUSATE SODIUM 8.6-50 MG PO TABS: 1.0000 | ORAL_TABLET | Freq: Every evening | ORAL | Status: DC | PRN

## 2021-08-09 MED ORDER — FENTANYL CITRATE (PF) 250 MCG/5ML IJ SOLN
INTRAMUSCULAR | Status: DC | PRN
  Administered 2021-08-09: 150 ug via INTRAVENOUS
  Administered 2021-08-09 (×2): 50 ug via INTRAVENOUS

## 2021-08-09 MED ORDER — MIDAZOLAM HCL 2 MG/2ML IJ SOLN: 0.5000 mg | Freq: Once | INTRAMUSCULAR | Status: DC | PRN

## 2021-08-09 MED ORDER — FENTANYL CITRATE (PF) 250 MCG/5ML IJ SOLN
INTRAMUSCULAR | Status: AC
  Filled 2021-08-09: qty 5

## 2021-08-09 MED ORDER — TRIAMTERENE-HCTZ 37.5-25 MG PO CAPS: 1.0000 | ORAL_CAPSULE | Freq: Every day | ORAL | Status: DC

## 2021-08-09 MED ORDER — BISACODYL 5 MG PO TBEC: 5.0000 mg | DELAYED_RELEASE_TABLET | Freq: Every day | ORAL | Status: DC | PRN

## 2021-08-09 MED ORDER — ZOLPIDEM TARTRATE 5 MG PO TABS
5.0000 mg | ORAL_TABLET | Freq: Every evening | ORAL | Status: DC | PRN
  Administered 2021-08-09: 5 mg via ORAL
  Filled 2021-08-09: qty 1

## 2021-08-09 MED ORDER — MIDAZOLAM HCL 2 MG/2ML IJ SOLN
INTRAMUSCULAR | Status: AC
  Filled 2021-08-09: qty 2

## 2021-08-09 MED ORDER — LIDOCAINE 2% (20 MG/ML) 5 ML SYRINGE
INTRAMUSCULAR | Status: DC | PRN
  Administered 2021-08-09: 40 mg via INTRAVENOUS

## 2021-08-09 MED ORDER — SODIUM CHLORIDE 0.9 % IV SOLN
250.0000 mL | INTRAVENOUS | Status: DC
  Administered 2021-08-09: 250 mL via INTRAVENOUS

## 2021-08-09 MED ORDER — ALUM & MAG HYDROXIDE-SIMETH 200-200-20 MG/5ML PO SUSP
30.0000 mL | Freq: Four times a day (QID) | ORAL | Status: DC | PRN
  Administered 2021-08-10: 30 mL via ORAL
  Filled 2021-08-09: qty 30

## 2021-08-09 MED ORDER — HYDROMORPHONE HCL 1 MG/ML IJ SOLN
INTRAMUSCULAR | Status: AC
  Filled 2021-08-09: qty 1

## 2021-08-09 MED ORDER — 0.9 % SODIUM CHLORIDE (POUR BTL) OPTIME
TOPICAL | Status: DC | PRN
  Administered 2021-08-09: 1000 mL

## 2021-08-09 SURGICAL SUPPLY — 87 items
BAG COUNTER SPONGE SURGICOUNT (BAG) ×2 IMPLANT
BENZOIN TINCTURE PRP APPL 2/3 (GAUZE/BANDAGES/DRESSINGS) ×2 IMPLANT
BLADE CLIPPER SURG (BLADE) IMPLANT
BONE VIVIGEN FORMABLE 10CC (Bone Implant) ×2 IMPLANT
BUR PRESCISION 1.7 ELITE (BURR) ×2 IMPLANT
BUR ROUND FLUTED 5 RND (BURR) ×2 IMPLANT
BUR ROUND PRECISION 4.0 (BURR) IMPLANT
BUR SABER RD CUTTING 3.0 (BURR) IMPLANT
CARTRIDGE OIL MAESTRO DRILL (MISCELLANEOUS) ×1 IMPLANT
CLSR STERI-STRIP ANTIMIC 1/2X4 (GAUZE/BANDAGES/DRESSINGS) ×2 IMPLANT
CNTNR URN SCR LID CUP LEK RST (MISCELLANEOUS) ×1 IMPLANT
CONT SPEC 4OZ STRL OR WHT (MISCELLANEOUS) ×1
COVER BACK TABLE 60X90IN (DRAPES) ×2 IMPLANT
COVER MAYO STAND STRL (DRAPES) ×4 IMPLANT
COVER SURGICAL LIGHT HANDLE (MISCELLANEOUS) ×2 IMPLANT
DIFFUSER DRILL AIR PNEUMATIC (MISCELLANEOUS) ×2 IMPLANT
DRAIN CHANNEL 15F RND FF W/TCR (WOUND CARE) IMPLANT
DRAPE C-ARM 42X72 X-RAY (DRAPES) ×2 IMPLANT
DRAPE C-ARMOR (DRAPES) IMPLANT
DRAPE POUCH INSTRU U-SHP 10X18 (DRAPES) ×2 IMPLANT
DRAPE SURG 17X23 STRL (DRAPES) ×8 IMPLANT
DURAPREP 26ML APPLICATOR (WOUND CARE) ×2 IMPLANT
ELECT BLADE 4.0 EZ CLEAN MEGAD (MISCELLANEOUS) ×2
ELECT CAUTERY BLADE 6.4 (BLADE) ×2 IMPLANT
ELECT REM PT RETURN 9FT ADLT (ELECTROSURGICAL) ×2
ELECTRODE BLDE 4.0 EZ CLN MEGD (MISCELLANEOUS) ×1 IMPLANT
ELECTRODE REM PT RTRN 9FT ADLT (ELECTROSURGICAL) ×1 IMPLANT
EVACUATOR SILICONE 100CC (DRAIN) IMPLANT
FILTER STRAW FLUID ASPIR (MISCELLANEOUS) ×2 IMPLANT
GAUZE 4X4 16PLY ~~LOC~~+RFID DBL (SPONGE) ×2 IMPLANT
GAUZE SPONGE 4X4 12PLY STRL (GAUZE/BANDAGES/DRESSINGS) ×2 IMPLANT
GLOVE SRG 8 PF TXTR STRL LF DI (GLOVE) ×1 IMPLANT
GLOVE SURG ENC MOIS LTX SZ6.5 (GLOVE) ×2 IMPLANT
GLOVE SURG ENC MOIS LTX SZ8 (GLOVE) ×2 IMPLANT
GLOVE SURG UNDER POLY LF SZ7 (GLOVE) ×2 IMPLANT
GLOVE SURG UNDER POLY LF SZ8 (GLOVE) ×1
GOWN STRL REUS W/ TWL LRG LVL3 (GOWN DISPOSABLE) ×2 IMPLANT
GOWN STRL REUS W/ TWL XL LVL3 (GOWN DISPOSABLE) ×1 IMPLANT
GOWN STRL REUS W/TWL LRG LVL3 (GOWN DISPOSABLE) ×2
GOWN STRL REUS W/TWL XL LVL3 (GOWN DISPOSABLE) ×1
INTERBODY SABLE 10X26 7-14 15D (Miscellaneous) ×2 IMPLANT
IV CATH 14GX2 1/4 (CATHETERS) ×2 IMPLANT
KIT BASIN OR (CUSTOM PROCEDURE TRAY) ×2 IMPLANT
KIT POSITION SURG JACKSON T1 (MISCELLANEOUS) ×2 IMPLANT
KIT TURNOVER KIT B (KITS) ×2 IMPLANT
MARKER SKIN DUAL TIP RULER LAB (MISCELLANEOUS) ×4 IMPLANT
NEEDLE 18GX1X1/2 (RX/OR ONLY) (NEEDLE) ×2 IMPLANT
NEEDLE 22X1 1/2 (OR ONLY) (NEEDLE) ×4 IMPLANT
NEEDLE HYPO 25GX1X1/2 BEV (NEEDLE) ×2 IMPLANT
NEEDLE SPNL 18GX3.5 QUINCKE PK (NEEDLE) ×4 IMPLANT
NS IRRIG 1000ML POUR BTL (IV SOLUTION) ×2 IMPLANT
OIL CARTRIDGE MAESTRO DRILL (MISCELLANEOUS) ×2
PACK LAMINECTOMY ORTHO (CUSTOM PROCEDURE TRAY) ×2 IMPLANT
PACK UNIVERSAL I (CUSTOM PROCEDURE TRAY) ×2 IMPLANT
PAD ARMBOARD 7.5X6 YLW CONV (MISCELLANEOUS) ×4 IMPLANT
PATTIES SURGICAL .5 X1 (DISPOSABLE) ×2 IMPLANT
PATTIES SURGICAL .5X1.5 (GAUZE/BANDAGES/DRESSINGS) ×2 IMPLANT
ROD EXPEDIUM PRE BENT 5.5X75 (Rod) ×2 IMPLANT
ROD SPINAL EXPEDIUM 5.5X70 (Rod) ×2 IMPLANT
SCREW POLY EXPENIUM 5.5 9X40 (Screw) ×4 IMPLANT
SCREW SET SINGLE INNER (Screw) ×14 IMPLANT
SCREW VIPER CORT FIX 6X35 (Screw) ×6 IMPLANT
SCREW VIPER CORTICAL FIX 6X40 (Screw) ×4 IMPLANT
SPONGE INTESTINAL PEANUT (DISPOSABLE) ×2 IMPLANT
SPONGE SURGIFOAM ABS GEL 100 (HEMOSTASIS) ×2 IMPLANT
STRIP CLOSURE SKIN 1/2X4 (GAUZE/BANDAGES/DRESSINGS) ×4 IMPLANT
SURGIFLO W/THROMBIN 8M KIT (HEMOSTASIS) IMPLANT
SUT MNCRL AB 4-0 PS2 18 (SUTURE) ×2 IMPLANT
SUT VIC AB 0 CT1 18XCR BRD 8 (SUTURE) ×1 IMPLANT
SUT VIC AB 0 CT1 8-18 (SUTURE) ×1
SUT VIC AB 1 CT1 18XCR BRD 8 (SUTURE) ×1 IMPLANT
SUT VIC AB 1 CT1 8-18 (SUTURE) ×1
SUT VIC AB 2-0 CT2 18 VCP726D (SUTURE) ×4 IMPLANT
SYR 20ML LL LF (SYRINGE) ×4 IMPLANT
SYR BULB IRRIG 60ML STRL (SYRINGE) ×2 IMPLANT
SYR CONTROL 10ML LL (SYRINGE) ×4 IMPLANT
SYR TB 1ML LUER SLIP (SYRINGE) ×2 IMPLANT
TAP EXPEDIUM DL 4.35 (INSTRUMENTS) ×2 IMPLANT
TAP EXPEDIUM DL 5.0 (INSTRUMENTS) ×2 IMPLANT
TAP EXPEDIUM DL 6.0 (INSTRUMENTS) ×2 IMPLANT
TAP EXPEDIUM DL 7.0 (INSTRUMENTS) ×1
TAP EXPEDIUM DL 7X2 (INSTRUMENTS) ×1 IMPLANT
TAP EXPEDIUM DL 8.0 (INSTRUMENTS) ×2 IMPLANT
TAPE CLOTH SURG 4X10 WHT LF (GAUZE/BANDAGES/DRESSINGS) ×2 IMPLANT
TRAY FOLEY MTR SLVR 16FR STAT (SET/KITS/TRAYS/PACK) ×2 IMPLANT
WATER STERILE IRR 1000ML POUR (IV SOLUTION) ×2 IMPLANT
YANKAUER SUCT BULB TIP NO VENT (SUCTIONS) ×2 IMPLANT

## 2021-08-09 NOTE — Op Note (Signed)
PATIENT NAME: Kristin Bush [facility]   MEDICAL RECORD NO.:   [id number]    DATE OF BIRTH: 1950-11-03   DATE OF PROCEDURE: 08/09/2021                                OPERATIVE REPORT     PREOPERATIVE DIAGNOSES: 1. Right-sided L3 radiculopathy. 2. L3-4 stenosis. 3. Status post previous L4-5 and L5-S1 attempted fusion 4. Possible L4-5 and L5-S1 nonunion   POSTOPERATIVE DIAGNOSES: 1. Right-sided L3 radiculopathy. 2. L3-4 stenosis. 3. Status post previous L4-5 and L5-S1 attempted fusion 4. L4-5 and L5-S1 nonunion   PROCEDURES: 1.  L3-4 bilateral decompression. 2.  Right-sided L3-4 transforaminal lumbar interbody fusion. 3.  Left-sided L3-4 posterolateral fusion. 4.  Exploration of spinal fusion L4-5, L5-S1 5.  Revision posterior spinal fusion L4-5, L5-S1 6.  Insertion of interbody device x1 (Globus expandable intervertebral spacer). 7.  Placement of posterior segmental instrumentation at L3 bilaterally. 8.  Removal and reinsertion of posterior spinal fixation bilaterally at L4 and S1, and on the left at L5 9.  Use of local autograft. 10. Use of morselized allograft - ViviGen. 11. Intraoperative use of fluoroscopy.   SURGEON:  Estill Bamberg, MD.   ASSISTANTJason Coop, PA-C.   ANESTHESIA:  General endotracheal anesthesia.   COMPLICATIONS:  None.   DISPOSITION:  Stable.   ESTIMATED BLOOD LOSS:   100 cc   INDICATIONS FOR SURGERY:  Briefly, Ms. Kristin Bush is a pleasant 70 y.o. -year-old patient who did present to me with severe and ongoing pain in the right leg, in addition to prominent pain in her back. I did feel that her leg pain was secondary to the stenosis noted at L3-4.  In addition, a CAT scan preoperatively did reveal a questionable fusion at L4-5 and L5-S1.  The plan was to proceed with an L3-4 decompression and fusion, and to intraoperatively assess the success of the L4-5 and L5-S1 fusion, and to proceed with a revision posterior fusion if indicated. The patient failed  conservative care and did wish to proceed with the procedure  noted above.   OPERATIVE DETAILS:  On 08/09/2021, the patient was brought to surgery and general endotracheal anesthesia was administered.  The patient was placed prone on a well-padded flat Jackson bed with a spinal frame.  Antibiotics were given and a time-out procedure was performed. The back was prepped and draped in the usual fashion.  A midline incision was made spanning L3-S1.  The fascia was incised at the midline.  The paraspinal musculature was bluntly swept laterally.  At this point, the landmarks for the L3 pedicle screws were identified, and using AP and lateral fluoroscopy, I did cannulate the L3 pedicles using a gearshift probe and a series of taps.  I then identified the previously placed hardware at L4, L5, and S1.  The caps were removed, as were the interconnecting rods.  At this point, the facet joints bilaterally at L4-5 and L5-S1 were subperiosteally exposed, and I did grab firm hold of the back of the pedicle screws at L4, L5, and S1.  I did perform a pushing and pulling maneuver, and there was obvious motion identified across the facet joints at both L4-5 and L5-S1.  At this point, the pedicle screws were removed bilaterally from L4-S1.  The posterolateral gutter was exposed on the right and left sides, including the transverse processes and facet joints at L3, L4, L5, and S1.  I did use a tap in each of the pedicle holes from L4-S1.  The taps were upsized, relative to the screws that were removed.  Of note, I did not recannulate the L5 pedicle on the right, as it was notable that the screw did slightly breach the medial cortex as noted on the CAT scan.  However, the screws bilaterally at L4, and S1, and on the left at L5, were upsized in order to gain the appropriate amount of purchase of the screws.  9 mm screws were placed at S1, and 6 mm screws were placed at L4 and L5.  At this point, I turned my attention to the  decompressive aspect of the procedure.  At L3-4, I did perform a bilateral lateral recess decompression using a high-speed bur and a series of Kerrison punches.  I was pleased with the lateral recess decompression I was able to perform bilaterally.  At this point, on the right side, I did perform a thorough and complete neuroforaminal decompression, with near-complete removal of the right L3-4 facet joint. I was able to expose the exiting right L3 nerve.  There was noted to be compression ventrally.  I did work meticulously ventral to the nerve, and was able to identify a rather obvious disc herniation that was compressive.  The herniation was removed, thereby entirely decompressing the exiting right L3 nerve.  At this point, with an assistant holding medial retraction of the traversing right L4 nerve, I did perform an annulotomy at the posterolateral aspect of the L3-4 intervertebral space.  I then used a series of curettes and pituitary rongeurs to perform a thorough and complete intervertebral diskectomy.  The intervertebral space was then liberally packed with autograft as well as allograft in the form of ViviGen, as was the appropriate-sized intervertebral spacer.  The spacer was then tamped into position in the usual fashion.  The spacer was then expanded to approximately 11 mm in height. I was very pleased with the press-fit of the spacer.  I then placed 6 x 35 mm screws bilaterally at L4.   At this point, I did use a high-speed bur to decorticate the transverse processes bilaterally at L3, L4, L5, and S1.  Abundant autograft and allograft was packed into the posterolateral gutters bilaterally, in order to help optimize the success of the fusion posteriorly at these levels.  I then secured bilateral rods into the tulip heads of the screws bilaterally, after which point caps were placed.  A final locking procedure was performed bilaterally.  I was very pleased with the final AP and lateral  fluoroscopic images.  The wound was copiously irrigated multiple times, prior to placement of the bone graft. The wound was explored for any undue bleeding and there was no substantial bleeding encountered.  Gel-Foam was placed over the laminectomy site.  The wound was then closed in layers using #1 Vicryl followed by 2-0 Vicryl, followed by 4-0 Monocryl.  Benzoin and Steri-Strips were applied followed by sterile dressing.    Of note, Jason Coop was my assistant throughout surgery, and did aid in retraction, placement of the hardware, the decompression, suctioning, and closure.       Estill Bamberg, MD

## 2021-08-09 NOTE — Anesthesia Procedure Notes (Signed)
Procedure Name: Intubation Date/Time: 08/09/2021 7:56 AM Performed by: Shary Decamp, CRNA Pre-anesthesia Checklist: Patient identified, Patient being monitored, Timeout performed, Emergency Drugs available and Suction available Patient Re-evaluated:Patient Re-evaluated prior to induction Oxygen Delivery Method: Circle System Utilized Preoxygenation: Pre-oxygenation with 100% oxygen Induction Type: IV induction Ventilation: Mask ventilation without difficulty Laryngoscope Size: Miller and 2 Grade View: Grade I Tube type: Oral Tube size: 7.5 mm Number of attempts: 1 Airway Equipment and Method: Stylet Placement Confirmation: ETT inserted through vocal cords under direct vision, positive ETCO2 and breath sounds checked- equal and bilateral Secured at: 22 cm Tube secured with: Tape Dental Injury: Teeth and Oropharynx as per pre-operative assessment

## 2021-08-09 NOTE — H&P (Signed)
PREOPERATIVE H&P  Chief Complaint: Right thigh pain  HPI: Kristin Bush is a 70 y.o. female who presents with ongoing pain in the right thigh  MRI reveals severe stenosis at L3/4 and possible nonunions at L4/5 and L5/S1  Patient has failed multiple forms of conservative care and continues to have pain (see office notes for additional details regarding the patient's full course of treatment)  Past Medical History:  Diagnosis Date   Arthritis    Depression    Hypertension    SI (sacroiliac) joint dysfunction    Spondylolisthesis    Wears glasses    Wears partial dentures    Past Surgical History:  Procedure Laterality Date   ABDOMINAL HYSTERECTOMY     BACK SURGERY  05/28/2018   PLIF   CATARACT EXTRACTION W/PHACO Left 10/06/2014   Procedure: CATARACT EXTRACTION PHACO AND INTRAOCULAR LENS PLACEMENT LEFT EYE;  Surgeon: Gemma Payor, MD;  Location: [facility];  Service: Ophthalmology;  Laterality: Left;  CDE:5.49   CATARACT EXTRACTION W/PHACO Right 10/17/2014   Procedure: CATARACT EXTRACTION PHACO AND INTRAOCULAR LENS PLACEMENT RIGHT EYE CDE=9.81;  Surgeon: Gemma Payor, MD;  Location: [facility];  Service: Ophthalmology;  Laterality: Right;   MULTIPLE TOOTH EXTRACTIONS     ROTATOR CUFF REPAIR     right shoulder   SACROILIAC JOINT FUSION Right 11/18/2019   Procedure: RIGHT SACROILIAC JOINT FUSION;  Surgeon: Estill Bamberg, MD;  Location: [facility];  Service: Orthopedics;  Laterality: Right;   SHOULDER ARTHROSCOPY WITH SUBACROMIAL DECOMPRESSION, ROTATOR CUFF REPAIR AND BICEP TENDON REPAIR Left 09/17/2018   Procedure: LEFT SHOULDER ARTHROSCOPY WITH DEBRIDEMENT, SUBACROMIAL DECOMPRESSION, ROTATOR CUFF;  Surgeon: Bjorn Pippin, MD;  Location: [facility];  Service: Orthopedics;  Laterality: Left;   TUBAL LIGATION     Social History   Socioeconomic History   Marital status: Widowed    Spouse name: Not on file   Number of children: Not on file   Years of education: Not on file    Highest education level: Not on file  Occupational History   Not on file  Tobacco Use   Smoking status: Never   Smokeless tobacco: Never  Vaping Use   Vaping Use: Never used  Substance and Sexual Activity   Alcohol use: No   Drug use: No   Sexual activity: Not on file  Other Topics Concern   Not on file  Social History Narrative   Not on file   Social Determinants of Health   Financial Resource Strain: Not on file  Food Insecurity: Not on file  Transportation Needs: Not on file  Physical Activity: Not on file  Stress: Not on file  Social Connections: Not on file   Family History  Problem Relation Age of Onset   Pulmonary fibrosis Mother    Aneurysm Father    No Known Allergies Prior to Admission medications   Medication Sig Start Date End Date Taking? Authorizing Provider  cyclobenzaprine (FLEXERIL) 5 MG tablet Take 5-10 mg by mouth 3 (three) times daily as needed for muscle spasms. 05/08/21  Yes [provider]  Homeopathic Products (PROSACEA) GEL Apply 1 application topically daily as needed (rosacea).   Yes [provider]  hydroxypropyl methylcellulose / hypromellose (ISOPTO TEARS / GONIOVISC) 2.5 % ophthalmic solution Place 1 drop into both eyes 3 (three) times daily as needed for dry eyes.   Yes [provider]  meloxicam (MOBIC) 7.5 MG tablet Take 7.5 mg by mouth daily. 06/10/21  Yes  [provider]  traMADol (ULTRAM) 50 MG tablet Take 50 mg by mouth 3 (three) times daily as needed for pain. 05/23/21  Yes [provider]  triamterene-hydrochlorothiazide (DYAZIDE) 37.5-25 MG capsule Take 1 tablet by mouth daily.    Yes [provider]  methocarbamol (ROBAXIN) 500 MG tablet Take 1 tablet (500 mg total) by mouth every 6 (six) hours as needed for muscle spasms. Patient not taking: No sig reported 11/18/19   Georga Bora, PA-C     All other systems have been reviewed and were otherwise negative with the exception of  those mentioned in the HPI and as above.  Physical Exam: Vitals:   08/09/21 0610 08/09/21 0620  BP: (!) 185/78 (!) 160/56  Pulse: 98   Resp: 18   Temp: 98.2 F (36.8 C)   SpO2: 95%     Body mass index is 33.65 kg/m.  General: Alert, no acute distress Cardiovascular: No pedal edema Respiratory: No cyanosis, no use of accessory musculature Skin: No lesions in the area of chief complaint Neurologic: Sensation intact distally Psychiatric: Patient is competent for consent with normal mood and affect Lymphatic: No axillary or cervical lymphadenopathy   Assessment/Plan: RIGHT-SIDED FORAMINAL STENOSIS, L3/4, POSSIBLE NONUNIONS AT L3/4 AND L4/5 Plan for Procedure(s): RIGHT-SIDED LUMBAR 3 - LUMBAR 4 TRANSFORAMINAL LUMBAR INTERBODY FUSION WITH INSTRUMENTATION AND ALLOGRAFT, POSSIBLE POSTERIOR SPINAL FUSIONS, L4/5 AND L5/S1   Jackelyn Hoehn, MD 08/09/2021 7:22 AM

## 2021-08-09 NOTE — Transfer of Care (Signed)
Immediate Anesthesia Transfer of Care Note  Patient: Kristin Bush  Procedure(s) Performed: RIGHT-SIDED LUMBAR 3 - LUMBAR 4 TRANSFORAMINAL LUMBAR INTERBODY FUSION WITH INSTRUMENTATION AND ALLOGRAFT (Right)  Patient Location: PACU  Anesthesia Type:General  Level of Consciousness: drowsy and patient cooperative  Airway & Oxygen Therapy: Patient Spontanous Breathing and Patient connected to nasal cannula oxygen  Post-op Assessment: Report given to RN and Post -op Vital signs reviewed and stable  Post vital signs: Reviewed and stable  Last Vitals:  Vitals Value Taken Time  BP 130/119 08/09/21 1259  Temp    Pulse 86 08/09/21 1300  Resp 12 08/09/21 1300  SpO2 96 % 08/09/21 1300  Vitals shown include unvalidated device data.  Last Pain:  Vitals:   08/09/21 0610  TempSrc: Oral  PainSc: 5       Patients Stated Pain Goal: 2 (08/09/21 0610)  Complications: No notable events documented.

## 2021-08-09 NOTE — Anesthesia Postprocedure Evaluation (Signed)
Anesthesia Post Note  Patient: Kristin Bush  Procedure(s) Performed: RIGHT-SIDED LUMBAR 3 - LUMBAR 4 TRANSFORAMINAL LUMBAR INTERBODY FUSION WITH INSTRUMENTATION AND ALLOGRAFT (Right)     Patient location during evaluation: PACU Anesthesia Type: General Level of consciousness: awake and alert, patient cooperative and oriented Pain management: pain level controlled (pt sitting in chair, pain improving) Vital Signs Assessment: post-procedure vital signs reviewed and stable Respiratory status: spontaneous breathing, nonlabored ventilation and respiratory function stable Cardiovascular status: blood pressure returned to baseline and stable Postop Assessment: no apparent nausea or vomiting, able to ambulate and adequate PO intake Anesthetic complications: no   No notable events documented.  Last Vitals:  Vitals:   08/09/21 1345 08/09/21 1400  BP:    Pulse: 91   Resp: 14 16  Temp:    SpO2: 92% 92%    Last Pain:  Vitals:   08/09/21 1330  TempSrc:   PainSc: 10-Worst pain ever                 Frutoso Dimare,E. Solimar Maiden

## 2021-08-10 MED ORDER — HYDROMORPHONE HCL 1 MG/ML IJ SOLN
1.0000 mg | INTRAMUSCULAR | Status: DC | PRN
  Administered 2021-08-10 (×2): 1 mg via INTRAVENOUS
  Filled 2021-08-10 (×2): qty 1

## 2021-08-10 MED ORDER — HYDROMORPHONE HCL 2 MG PO TABS: 1.0000 mg | ORAL_TABLET | ORAL | Status: DC | PRN

## 2021-08-10 MED ORDER — HYDROMORPHONE HCL 2 MG PO TABS
4.0000 mg | ORAL_TABLET | ORAL | Status: DC | PRN
  Administered 2021-08-10: 4 mg via ORAL
  Filled 2021-08-10: qty 2

## 2021-08-10 MED ORDER — ACETAMINOPHEN 500 MG PO TABS: 1000.0000 mg | ORAL_TABLET | Freq: Three times a day (TID) | ORAL | Status: DC | PRN

## 2021-08-10 MED ORDER — HYDROMORPHONE HCL 2 MG PO TABS: 2.0000 mg | ORAL_TABLET | ORAL | Status: DC | PRN

## 2021-08-10 MED ORDER — ACETAMINOPHEN 325 MG PO TABS
650.0000 mg | ORAL_TABLET | ORAL | Status: DC | PRN
  Filled 2021-08-10: qty 2

## 2021-08-10 MED ORDER — ACETAMINOPHEN 650 MG RE SUPP: 650.0000 mg | RECTAL | Status: DC | PRN

## 2021-08-10 MED ORDER — CYCLOBENZAPRINE HCL 5 MG PO TABS
5.0000 mg | ORAL_TABLET | Freq: Three times a day (TID) | ORAL | 0 refills | Status: DC
Start: 2021-08-10 — End: 2023-07-01

## 2021-08-10 NOTE — Plan of Care (Signed)
Patient alert and oriented, voiding adequately, skin clean, dry and intact without evidence of skin break down, or symptoms of complications - no redness or edema noted, only slight tenderness at site.  Patient states pain is manageable at time of discharge. Patient has an appointment with MD in 2 weeks 

## 2021-08-10 NOTE — Progress Notes (Signed)
OT NOTE  OT returning to patients room for second session. Pt much more alert and reports taking 2.5 hour nap. Pt able to recall precautions and follow education. Recommendation for dc home with family today.    08/10/21 2000  OT Visit Information  Last OT Received On 08/10/21  History of Present Illness Pt is a 70 y/o female who presents s/p L3-L4 TLIF on 08/09/2021. PMH significant for HTN, SI joint fusion 2021, PLIF in 2019, B RCR.  Precautions  Precautions Fall;Back  Precaution Comments able to recall back precautions  Required Braces or Orthoses Spinal Brace  Spinal Brace TLSO;Applied in sitting position  Cognition  Arousal/Alertness Awake/alert  Behavior During Therapy WFL for tasks assessed/performed  Overall Cognitive Status Impaired/Different from baseline  Memory Decreased short-term memory  General Comments pt with recall of precautions but requires cues during functional task  ADL  Overall ADL's  Needs assistance/impaired  Eating/Feeding Modified independent  Grooming Modified independent  Upper Body Dressing  Min guard;Sitting  Upper Body Dressing Details (indicate cue type and reason) able to don brace with increased time  Lower Body Dressing Moderate assistance  Lower Body Dressing Details (indicate cue type and reason) pt has reacher at home to use. pt has all AE required at home already now that patient is more alert able to verbalize previous education with using AE. pt requires (A) to pull up bottoms and educated on placing pants slightly over brace if needed  Toilet Transfer Supervision/safety  Toileting- Clothing Manipulation and Hygiene Moderate assistance  Toileting - Clothing Manipulation Details (indicate cue type and reason) unable to complete peri care  Functional mobility during ADLs Min guard;Rolling walker (2 wheels)  General ADL Comments pt dressed for home including shoes.  Bed Mobility  General bed mobility comments oob on arrival  Transfers   Overall transfer level Needs assistance  Equipment used Rolling walker (2 wheels)  Transfers Sit to/from Stand  Sit to Stand Min guard  OT - End of Session  Equipment Utilized During Treatment Back brace;Rolling walker (2 wheels)  Activity Tolerance Patient tolerated treatment well  Patient left in chair;with call bell/phone within reach  Nurse Communication Mobility status  OT Assessment/Plan  OT Plan Discharge plan remains appropriate  OT Visit Diagnosis Unsteadiness on feet (R26.81);Muscle weakness (generalized) (M62.81)  OT Frequency (ACUTE ONLY) Min 2X/week  Follow Up Recommendations No OT follow up  Assistance recommended at discharge None  OT Equipment BSC/3in1;Other (comment)  AM-PAC OT "6 Clicks" Daily Activity Outcome Measure (Version 2)  Help from another person eating meals? 3  Help from another person taking care of personal grooming? 3  Help from another person toileting, which includes using toliet, bedpan, or urinal? 3  Help from another person bathing (including washing, rinsing, drying)? 3  Help from another person to put on and taking off regular upper body clothing? 3  Help from another person to put on and taking off regular lower body clothing? 3  6 Click Score 18  Progressive Mobility  What is the highest level of mobility based on the progressive mobility assessment? Level 4 (Walks with assist in room) - Balance while marching in place and cannot step forward and back - Complete  Mobility Out of bed to chair with meals  OT Goal Progression  Progress towards OT goals Progressing toward goals  Acute Rehab OT Goals  Patient Stated Goal to go home  OT Goal Formulation With patient/family  Time For Goal Achievement 08/24/21  Potential to Achieve Goals  Good  ADL Goals  Pt Will Perform Lower Body Dressing with supervision;with adaptive equipment;sit to/from stand  Pt Will Transfer to Toilet with min assist;ambulating;regular height toilet  Pt Will Perform  Toileting - Clothing Manipulation and hygiene with min assist;sit to/from stand;with adaptive equipment  OT Time Calculation  OT Start Time (ACUTE ONLY) 1420  OT Stop Time (ACUTE ONLY) 1444  OT Time Calculation (min) 24 min  OT General Charges  $OT Visit 1 Visit  OT Treatments  $Self Care/Home Management  8-22 mins   Brynn, OTR/L  Acute Rehabilitation Services Pager: [phone] Office: [phone] .

## 2021-08-10 NOTE — Evaluation (Signed)
Physical Therapy Evaluation  Patient Details Name: Kristin Bush MRN: [id number] DOB: [birth year] Today's Date: 08/10/2021  History of Present Illness  Pt is a 70 y/o female who presents s/p L3-L4 TLIF on 08/09/2021. PMH significant for HTN, SI joint fusion 2021, PLIF in 2019, B RCR.   Clinical Impression  Pt admitted with above diagnosis. At the time of PT eval, pt was able to demonstrate transfers and ambulation with gross min guard assist and RW for support. Pt was educated on precautions, brace application/wearing schedule, appropriate activity progression, and car transfer. Brace adjusted for optimal fit. Pt currently with functional limitations due to the deficits listed below (see PT Problem List). Pt will benefit from skilled PT to increase their independence and safety with mobility to allow discharge to the venue listed below.         Recommendations for follow up therapy are one component of a multi-disciplinary discharge planning process, led by the attending physician.  Recommendations may be updated based on patient status, additional functional criteria and insurance authorization.  Follow Up Recommendations No PT follow up    Assistance Recommended at Discharge Intermittent Supervision/Assistance  Functional Status Assessment Patient has had a recent decline in their functional status and demonstrates the ability to make significant improvements in function in a reasonable and predictable amount of time.  Equipment Recommendations  Rolling walker (2 wheels);BSC/3in1    Recommendations for Other Services       Precautions / Restrictions Precautions Precautions: Fall;Back Precaution Booklet Issued: Yes (comment) Precaution Comments: Reviewed handout and pt was cued for precautions during functional mobility. Required Braces or Orthoses: Spinal Brace Spinal Brace: Thoracolumbosacral orthotic;Applied in sitting position Restrictions Weight Bearing Restrictions: No       Mobility  Bed Mobility               General bed mobility comments: Pt was received sitting up in the recliner.    Transfers Overall transfer level: Needs assistance Equipment used: Rolling walker (2 wheels) Transfers: Sit to/from Stand Sit to Stand: Min guard           General transfer comment: Hands on guarding for safety as pt powered up to full stand. VC's for hand placement on seated surface for safety.    Ambulation/Gait Ambulation/Gait assistance: Min guard Gait Distance (Feet): 350 Feet Assistive device: Rolling walker (2 wheels) Gait Pattern/deviations: Step-through pattern;Decreased stride length;Trunk flexed Gait velocity: Decreased Gait velocity interpretation: <1.31 ft/sec, indicative of household ambulator   General Gait Details: Slow and mildly unsteady however without overt LOB. VC's throughout for improved posture, closer walker proximity, and forward gaze.  Stairs            Wheelchair Mobility    Modified Rankin (Stroke Patients Only)       Balance Overall balance assessment: Needs assistance Sitting-balance support: Feet supported;No upper extremity supported Sitting balance-Leahy Scale: Fair     Standing balance support: No upper extremity supported;During functional activity Standing balance-Leahy Scale: Fair Standing balance comment: Statically                             Pertinent Vitals/Pain Pain Assessment: Faces Faces Pain Scale: Hurts little more Pain Location: Incision site Pain Descriptors / Indicators: Operative site guarding;Sore Pain Intervention(s): Limited activity within patient's tolerance;Monitored during session;Repositioned    Home Living Family/patient expects to be discharged to:: Private residence Living Arrangements: Alone Available Help at Discharge: Family;Available 24 hours/day Type of Home:  Apartment Home Access: Level entry       Home Layout: One level Home Equipment:  BSC/3in1 Additional Comments: Lives in a handicap accessible apt    Prior Function Prior Level of Function : Independent/Modified Independent             Mobility Comments: Increased time required for tasks due to pain       Hand Dominance   Dominant Hand: Right    Extremity/Trunk Assessment   Upper Extremity Assessment Upper Extremity Assessment: Defer to OT evaluation    Lower Extremity Assessment Lower Extremity Assessment: Generalized weakness (Consistent with pre-op diagnosis)    Cervical / Trunk Assessment Cervical / Trunk Assessment: Back Surgery  Communication   Communication: No difficulties  Cognition Arousal/Alertness: Awake/alert (Sleepy but easily arousable. Closing eyes at times during education while pt sitting - did better with maintaining attention for education while ambulating.) Behavior During Therapy: [facility] for tasks assessed/performed Overall Cognitive Status: Within Functional Limits for tasks assessed                                          General Comments      Exercises     Assessment/Plan    PT Assessment Patient needs continued PT services  PT Problem List Decreased strength;Decreased activity tolerance;Decreased balance;Decreased mobility;Decreased knowledge of use of DME;Decreased safety awareness;Decreased knowledge of precautions;Pain       PT Treatment Interventions DME instruction;Gait training;Functional mobility training;Therapeutic activities;Therapeutic exercise;Neuromuscular re-education;Patient/family education    PT Goals (Current goals can be found in the Care Plan section)  Acute Rehab PT Goals Patient Stated Goal: Home today PT Goal Formulation: With patient/family Time For Goal Achievement: 08/17/21 Potential to Achieve Goals: Good    Frequency Min 5X/week   Barriers to discharge        Co-evaluation               AM-PAC PT "6 Clicks" Mobility  Outcome Measure Help needed turning  from your back to your side while in a flat bed without using bedrails?: None Help needed moving from lying on your back to sitting on the side of a flat bed without using bedrails?: A Little Help needed moving to and from a bed to a chair (including a wheelchair)?: A Little Help needed standing up from a chair using your arms (e.g., wheelchair or bedside chair)?: A Little Help needed to walk in hospital room?: A Little Help needed climbing 3-5 steps with a railing? : A Little 6 Click Score: 19    End of Session Equipment Utilized During Treatment: Gait belt;Back brace Activity Tolerance: Patient tolerated treatment well Patient left: in chair;with call bell/phone within reach;with family/visitor present Nurse Communication: Mobility status PT Visit Diagnosis: Unsteadiness on feet (R26.81);Pain Pain - part of body:  (back)    Time: 1962-2297 PT Time Calculation (min) (ACUTE ONLY): 36 min   Charges:   PT Evaluation $PT Eval Low Complexity: 1 Low PT Treatments $Gait Training: 8-22 mins        Conni Slipper, PT, DPT Acute Rehabilitation Services Pager: [phone] Office: [phone]   Marylynn Pearson 08/10/2021, 11:10 AM

## 2021-08-10 NOTE — Evaluation (Signed)
Occupational Therapy Evaluation Patient Details Name: Kristin Bush MRN: [id number] DOB: Dec 04, 1950 Today's Date: 08/10/2021   History of Present Illness Pt is a 70 y/o female who presents s/p L3-L4 TLIF on 08/09/2021. PMH significant for HTN, SI joint fusion 2021, PLIF in 2019, B RCR.   Clinical Impression   Patient is s/p TLIF L3-4 surgery resulting in functional limitations due to the deficits listed below (see OT problem list). Pt limited this session by arousal level to sustain attention for education. Pt falling asleep and even in standing with decrease recall. Pt educated on back precautions with teach back extensively during session.  Patient will benefit from skilled OT acutely to increase independence and safety with ADLS to allow discharge home with family (A). Son present and expressing understanding to need second session based on patient's current lethargic state.        Recommendations for follow up therapy are one component of a multi-disciplinary discharge planning process, led by the attending physician.  Recommendations may be updated based on patient status, additional functional criteria and insurance authorization.   Follow Up Recommendations  No OT follow up    Assistance Recommended at Discharge None  Functional Status Assessment     Equipment Recommendations  BSC/3in1;Other (comment) (Rw)    Recommendations for Other Services       Precautions / Restrictions Precautions Precautions: Fall;Back Precaution Booklet Issued: Yes (comment) Precaution Comments: reviewed ADls and back precautions Required Braces or Orthoses: Spinal Brace Spinal Brace: Thoracolumbosacral orthotic;Applied in sitting position Restrictions Weight Bearing Restrictions: No      Mobility Bed Mobility Overal bed mobility: Needs Assistance Bed Mobility: Sit to Supine       Sit to supine: Mod assist   General bed mobility comments: cues for hand placement.,(A) to remain on side  and move body as a unit. pt needs (A) to scoot toward [facility] with bed tilted    Transfers Overall transfer level: Needs assistance Equipment used: Rolling walker (2 wheels) Transfers: Sit to/from Stand Sit to Stand: Min assist           General transfer comment: pt with hand held (A) from chair to bed surface      Balance Overall balance assessment: Needs assistance Sitting-balance support: Feet supported;Bilateral upper extremity supported Sitting balance-Leahy Scale: Fair     Standing balance support: Bilateral upper extremity supported;During functional activity Standing balance-Leahy Scale: Fair Standing balance comment: Statically                           ADL either performed or assessed with clinical judgement   ADL Overall ADL's : Needs assistance/impaired Eating/Feeding: Set up;Sitting   Grooming: Set up;Sitting       Lower Body Bathing: Moderate assistance   Upper Body Dressing : Minimal assistance Upper Body Dressing Details (indicate cue type and reason): pt is able to verbalize using brace and needs (A) for chest clip           Toileting - Clothing Manipulation Details (indicate cue type and reason): concerns for peri care but due to drowzy will address next session     Functional mobility during ADLs: Minimal assistance General ADL Comments: pt falling asleep initially with education. pt standing static to help with arousal and attention to be sustained. Son present. Educated that pt will have second session to help with recall due to current arousal.  Back handout provided and reviewed adls in detail. Pt educated on: clothing  between brace, never sleep in brace, set an alarm at night for medication, avoid sitting for long periods of time, correct bed positioning for sleeping, correct sequence for bed mobility, avoiding lifting more than 5 pounds and never wash directly over incision. Needs continued education required prior to d/c.      Vision Baseline Vision/History: 1 Wears glasses Additional Comments: at all times     Perception     Praxis      Pertinent Vitals/Pain Pain Assessment: Faces Faces Pain Scale: Hurts a little bit Pain Location: incision site Pain Descriptors / Indicators: Operative site guarding Pain Intervention(s): Limited activity within patient's tolerance;Premedicated before session     Hand Dominance Right   Extremity/Trunk Assessment Upper Extremity Assessment Upper Extremity Assessment: Overall WFL for tasks assessed   Lower Extremity Assessment Lower Extremity Assessment: Defer to PT evaluation   Cervical / Trunk Assessment Cervical / Trunk Assessment: Back Surgery   Communication Communication Communication: No difficulties   Cognition Arousal/Alertness: Lethargic;Suspect due to medications Behavior During Therapy: Flat affect Overall Cognitive Status: Impaired/Different from baseline Area of Impairment: Memory;Safety/judgement                     Memory: Decreased short-term memory   Safety/Judgement: Decreased awareness of safety     General Comments: pt was unable to repeat information provided just minutes before. pt given visual cue of precautions and not checking to verbalize precautions. Ot making patient state precautions and then repeat back to back to help with teach back. pt able to state with direct recall only at this time. Pt reports i know them then with poor recall showing lack of awareness to memory. pt with IV medications and lethargic. OT educated patient and family to let patient rest and reattempt after lunch     General Comments  incision dry,    Exercises     Shoulder Instructions      Home Living Family/patient expects to be discharged to:: Private residence Living Arrangements: Alone Available Help at Discharge: Family;Available 24 hours/day Type of Home: Apartment Home Access: Level entry     Home Layout: One level      Bathroom Shower/Tub: Chief Strategy Officer: Standard     Home Equipment: BSC/3in1   Additional Comments: Lives in a handicap accessible apt. granddaughter to help her and son. Pt also has sister in law and daughter in law available      Prior Functioning/Environment Prior Level of Function : Independent/Modified Independent             Mobility Comments: increased time          OT Problem List: Decreased strength;Decreased activity tolerance;Impaired balance (sitting and/or standing);Decreased cognition;Decreased safety awareness;Decreased knowledge of use of DME or AE;Decreased knowledge of precautions;Obesity;Pain      OT Treatment/Interventions: Self-care/ADL training;Therapeutic exercise;Energy conservation;DME and/or AE instruction;Therapeutic activities;Cognitive remediation/compensation;Patient/family education;Balance training    OT Goals(Current goals can be found in the care plan section) Acute Rehab OT Goals Patient Stated Goal: to go home today OT Goal Formulation: With patient/family Time For Goal Achievement: 08/24/21 Potential to Achieve Goals: Good  OT Frequency: Min 2X/week   Barriers to D/C: Decreased caregiver support  will have PRN (A) of family - granddaughter can be 24/7 initially       Co-evaluation              AM-PAC OT "6 Clicks" Daily Activity     Outcome Measure Help from another person eating  meals?: A Little Help from another person taking care of personal grooming?: A Little Help from another person toileting, which includes using toliet, bedpan, or urinal?: A Lot Help from another person bathing (including washing, rinsing, drying)?: A Little Help from another person to put on and taking off regular upper body clothing?: A Little Help from another person to put on and taking off regular lower body clothing?: A Lot 6 Click Score: 16   End of Session Equipment Utilized During Treatment: Back brace Nurse  Communication: Mobility status;Precautions  Activity Tolerance: Patient limited by lethargy Patient left: in bed;with call bell/phone within reach;with nursing/sitter in room  OT Visit Diagnosis: Unsteadiness on feet (R26.81);Muscle weakness (generalized) (M62.81)                Time: 1000-1020 OT Time Calculation (min): 20 min Charges:  OT General Charges $OT Visit: 1 Visit OT Evaluation $OT Eval Moderate Complexity: 1 Mod   Brynn, OTR/L  Acute Rehabilitation Services Pager: [phone] Office: [phone] .   Mateo Flow 08/10/2021, 11:48 AM

## 2021-08-10 NOTE — Progress Notes (Signed)
    Patient doing well postop day 1.  She has noted a dramatic improvement in her right thigh pain.  She states that she has ambulated in the halls at least 4 times and she does not limp anymore.  She could not be more pleased.  She has noted some intermittent tingling in the thigh but this is improving.  She does have expected postoperative low back pain that is quite substantial.  She had a rather difficult night.  It appears the on-call provider prescribed her 4 mg tablets of Dilaudid and she has been a bit sedated since that time.  She otherwise is feeling better this morning.  She is very pleased with her surgical outcomes thus far   Physical Exam: Vitals:   08/10/21 0512 08/10/21 0843  BP: (!) 147/83 138/73  Pulse: 94 95  Resp: 20 17  Temp: 98.3 F (36.8 C) 98.7 F (37.1 C)  SpO2: 99% 95%    Dressing in place, CDI, pt sitting up comfortably in chair eating, friend at bedside. NVI  POD #1 s/p L3-L4 decompression and fusion with removal and reinsertion of hardware spanning L4-S1 secondary to historical nonunion. Substantially improved preoperative right thigh pain  - up with PT/OT, encourage ambulation - Percocet for pain, Cyclobenzaprine for muscle spasms - discontinue Dilaudid as it is causing substantial sedation - likely d/c home today with f/u in 2 weeks after clearance of PT

## 2021-08-14 ENCOUNTER — Encounter (HOSPITAL_COMMUNITY): Payer: Self-pay | Admitting: Orthopedic Surgery

## 2021-08-16 NOTE — Discharge Summary (Signed)
Patient ID: Kristin Bush MRN: [id number] DOB/AGE: May 08, 1951 70 y.o.  Admit date: 08/09/2021 Discharge date: 08/10/2021  Admission Diagnoses:  Principal Problem:   Radiculopathy   Discharge Diagnoses:  Same  Past Medical History:  Diagnosis Date   Arthritis    Depression    Hypertension    SI (sacroiliac) joint dysfunction    Spondylolisthesis    Wears glasses    Wears partial dentures     Surgeries: Procedure(s): RIGHT-SIDED LUMBAR 3 - LUMBAR 4 TRANSFORAMINAL LUMBAR INTERBODY FUSION WITH INSTRUMENTATION AND ALLOGRAFT on 08/09/2021   Consultants: None  Discharged Condition: Improved  Hospital Course: Kristin Bush is an 70 y.o. female who was admitted 08/09/2021 for operative treatment of Radiculopathy. Patient has severe unremitting pain that affects sleep, daily activities, and work/hobbies. After pre-op clearance the patient was taken to the operating room on 08/09/2021 and underwent  Procedure(s): RIGHT-SIDED LUMBAR 3 - LUMBAR 4 TRANSFORAMINAL LUMBAR INTERBODY FUSION WITH INSTRUMENTATION AND ALLOGRAFT.    Patient was given perioperative antibiotics:  Anti-infectives (From admission, onward)    Start     Dose/Rate Route Frequency Ordered Stop   08/09/21 2000  ceFAZolin (ANCEF) IVPB 2g/100 mL premix        2 g 200 mL/hr over 30 Minutes Intravenous Every 8 hours 08/09/21 1436 08/10/21 0328   08/09/21 0600  ceFAZolin (ANCEF) IVPB 2g/100 mL premix        2 g 200 mL/hr over 30 Minutes Intravenous On call to O.R. 08/09/21 0544 08/09/21 1200        Patient was given sequential compression devices, early ambulation to prevent DVT.  Patient benefited maximally from hospital stay and there were no complications.    Recent vital signs: BP 126/62 (BP Location: Left Arm)   Pulse 93   Temp 98.1 F (36.7 C) (Oral)   Resp 16   Ht 5\' 2"  (1.575 m)   Wt 83.5 kg   SpO2 97%   BMI 33.65 kg/m    Discharge Medications:   Allergies as of 08/10/2021   No Known  Allergies      Medication List     STOP taking these medications    methocarbamol 500 MG tablet Commonly known as: Robaxin   traMADol 50 MG tablet Commonly known as: ULTRAM       TAKE these medications    cyclobenzaprine 5 MG tablet Commonly known as: FLEXERIL Take 1-2 tablets (5-10 mg total) by mouth 3 (three) times daily. What changed:  when to take this reasons to take this   hydroxypropyl methylcellulose / hypromellose 2.5 % ophthalmic solution Commonly known as: ISOPTO TEARS / GONIOVISC Place 1 drop into both eyes 3 (three) times daily as needed for dry eyes.   oxyCODONE-acetaminophen 5-325 MG tablet Commonly known as: PERCOCET/ROXICET Take 1-2 tablets by mouth every 4 (four) hours as needed for severe pain.   Prosacea Gel Apply 1 application topically daily as needed (rosacea).   triamterene-hydrochlorothiazide 37.5-25 MG capsule Commonly known as: DYAZIDE Take 1 tablet by mouth daily.        Diagnostic Studies: DG Lumbar Spine 2-3 Views  Result Date: 08/09/2021 CLINICAL DATA:  Right-sided L3-4 fusion EXAM: LUMBAR SPINE - 2-3 VIEW COMPARISON:  Lumbar radiographs 10/05/2019, 08/09/2021 FINDINGS: Pre-existing pedicle screw interbody fusion L4-5 and L5-S1. Interval removal of the right L5 screw. Extension the fusion which now includes L3-4 with pedicle screw and rod fusion as well as interbody spacer in satisfactory position. IMPRESSION: L3-4 fusion without complication. Interval removal  of pedicle screw on the right at L5. Electronically Signed   By: Marlan Palau M.D.   On: 08/09/2021 13:40   DG Lumbar Spine 1 View  Result Date: 08/09/2021 CLINICAL DATA:  Surgery, elective. Preoperative localizer imaging for skin incision. EXAM: LUMBAR SPINE - 1 VIEW COMPARISON:  Lumbar CT myelogram 03/02/2019. Lumbar spine MRI 04/30/2021. lumbar spine radiographs 10/05/2019. FINDINGS: A single intraoperative lateral view radiograph of the lumbosacral spine is submitted.  Spinal numbering will remain consistent with that utilized on the prior lumbar spine MRI of 04/30/2021. By this numbering scheme, 5 lumbar vertebrae are assumed and the caudal most well-formed intervertebral disc space is designated L5-S1. A posterior spinal fusion construct spans the L4-S1 levels (bilateral pedicle screws and vertical interconnecting rods). Interbody devices are present at L4-L5 and L5-S1. Additional surgical hardware within the sacrum. On the provided image, a localizer projects posterior to the lumbar spine at the L2-L3 level, and a second localizer projects posterior to the sacral spine at approximately the S2-S3 level. IMPRESSION: Single lateral view intraoperative localizer radiograph of the lumbosacral spine, as described. Electronically Signed   By: Jackey Loge D.O.   On: 08/09/2021 10:47   DG C-Arm 1-60 Min-No Report  Result Date: 08/09/2021 Fluoroscopy was utilized by the requesting physician.  No radiographic interpretation.   DG C-Arm 1-60 Min-No Report  Result Date: 08/09/2021 Fluoroscopy was utilized by the requesting physician.  No radiographic interpretation.   DG C-Arm 1-60 Min-No Report  Result Date: 08/09/2021 Fluoroscopy was utilized by the requesting physician.  No radiographic interpretation.    Disposition: Discharge disposition: 01-Home or Self Care       Discharge Instructions     Discharge patient   Complete by: As directed    Discharge disposition: 01-Home or Self Care   Discharge patient date: 08/10/2021      POD #1 s/p L3-L4 decompression and fusion with removal and reinsertion of hardware spanning L4-S1 secondary to historical nonunion. Substantially improved preoperative right thigh pain   - up with PT/OT, encourage ambulation - Percocet for pain, Cyclobenzaprine for muscle spasms -Scripts for pain sent to pharmacy electronically  -D/C instructions sheet printed and in chart -D/C today  -F/U in office 2 weeks   Signed: Georga Bora 08/16/2021, 3:43 PM

## 2021-09-18 DIAGNOSIS — Z9889 Other specified postprocedural states: Secondary | ICD-10-CM | POA: Diagnosis not present

## 2021-09-18 DIAGNOSIS — M5416 Radiculopathy, lumbar region: Secondary | ICD-10-CM | POA: Diagnosis not present

## 2021-10-05 DIAGNOSIS — Z1339 Encounter for screening examination for other mental health and behavioral disorders: Secondary | ICD-10-CM | POA: Diagnosis not present

## 2021-10-05 DIAGNOSIS — E7800 Pure hypercholesterolemia, unspecified: Secondary | ICD-10-CM | POA: Diagnosis not present

## 2021-10-05 DIAGNOSIS — Z1331 Encounter for screening for depression: Secondary | ICD-10-CM | POA: Diagnosis not present

## 2021-10-05 DIAGNOSIS — Z0000 Encounter for general adult medical examination without abnormal findings: Secondary | ICD-10-CM | POA: Diagnosis not present

## 2021-10-05 DIAGNOSIS — F419 Anxiety disorder, unspecified: Secondary | ICD-10-CM | POA: Diagnosis not present

## 2021-10-05 DIAGNOSIS — Z7189 Other specified counseling: Secondary | ICD-10-CM | POA: Diagnosis not present

## 2021-10-05 DIAGNOSIS — Z6833 Body mass index (BMI) 33.0-33.9, adult: Secondary | ICD-10-CM | POA: Diagnosis not present

## 2021-10-05 DIAGNOSIS — R5383 Other fatigue: Secondary | ICD-10-CM | POA: Diagnosis not present

## 2021-10-05 DIAGNOSIS — Z299 Encounter for prophylactic measures, unspecified: Secondary | ICD-10-CM | POA: Diagnosis not present

## 2021-10-05 DIAGNOSIS — Z79899 Other long term (current) drug therapy: Secondary | ICD-10-CM | POA: Diagnosis not present

## 2021-10-05 DIAGNOSIS — I10 Essential (primary) hypertension: Secondary | ICD-10-CM | POA: Diagnosis not present

## 2021-11-02 DIAGNOSIS — M5416 Radiculopathy, lumbar region: Secondary | ICD-10-CM | POA: Diagnosis not present

## 2021-11-02 DIAGNOSIS — Z9889 Other specified postprocedural states: Secondary | ICD-10-CM | POA: Diagnosis not present

## 2021-11-02 DIAGNOSIS — M545 Low back pain, unspecified: Secondary | ICD-10-CM | POA: Diagnosis not present

## 2021-11-03 ENCOUNTER — Other Ambulatory Visit: Payer: Self-pay | Admitting: Orthopedic Surgery

## 2021-11-03 DIAGNOSIS — M5440 Lumbago with sciatica, unspecified side: Secondary | ICD-10-CM

## 2021-11-06 DIAGNOSIS — M5416 Radiculopathy, lumbar region: Secondary | ICD-10-CM | POA: Diagnosis not present

## 2021-11-14 DIAGNOSIS — M79604 Pain in right leg: Secondary | ICD-10-CM | POA: Diagnosis not present

## 2021-11-24 ENCOUNTER — Other Ambulatory Visit: Payer: Self-pay

## 2021-11-24 ENCOUNTER — Ambulatory Visit
Admission: RE | Admit: 2021-11-24 | Discharge: 2021-11-24 | Disposition: A | Discharge status: Home / Self Care | Payer: Medicare Other | Source: Ambulatory Visit | Attending: Orthopedic Surgery | Admitting: Orthopedic Surgery

## 2021-11-24 DIAGNOSIS — M48061 Spinal stenosis, lumbar region without neurogenic claudication: Secondary | ICD-10-CM | POA: Diagnosis not present

## 2021-11-24 DIAGNOSIS — R200 Anesthesia of skin: Secondary | ICD-10-CM | POA: Diagnosis not present

## 2021-11-24 DIAGNOSIS — M5440 Lumbago with sciatica, unspecified side: Secondary | ICD-10-CM

## 2021-11-24 DIAGNOSIS — M2578 Osteophyte, vertebrae: Secondary | ICD-10-CM | POA: Diagnosis not present

## 2021-11-30 ENCOUNTER — Other Ambulatory Visit: Payer: Self-pay | Admitting: Orthopedic Surgery

## 2021-11-30 DIAGNOSIS — M545 Low back pain, unspecified: Secondary | ICD-10-CM

## 2021-11-30 DIAGNOSIS — M5416 Radiculopathy, lumbar region: Secondary | ICD-10-CM | POA: Diagnosis not present

## 2021-12-04 ENCOUNTER — Ambulatory Visit
Admission: RE | Admit: 2021-12-04 | Discharge: 2021-12-04 | Disposition: A | Discharge status: Home / Self Care | Payer: Medicare Other | Source: Ambulatory Visit | Attending: Orthopedic Surgery | Admitting: Orthopedic Surgery

## 2021-12-04 DIAGNOSIS — M5116 Intervertebral disc disorders with radiculopathy, lumbar region: Secondary | ICD-10-CM | POA: Diagnosis not present

## 2021-12-04 DIAGNOSIS — M545 Low back pain, unspecified: Secondary | ICD-10-CM

## 2021-12-04 MED ORDER — IOPAMIDOL (ISOVUE-M 200) INJECTION 41%
1.0000 mL | Freq: Once | INTRAMUSCULAR | Status: AC
  Administered 2021-12-04: 1 mL via EPIDURAL

## 2021-12-04 MED ORDER — METHYLPREDNISOLONE ACETATE 40 MG/ML INJ SUSP (RADIOLOG
80.0000 mg | Freq: Once | INTRAMUSCULAR | Status: AC
  Administered 2021-12-04: 80 mg via EPIDURAL

## 2021-12-04 NOTE — Discharge Instructions (Signed)

## 2021-12-07 DIAGNOSIS — Z1231 Encounter for screening mammogram for malignant neoplasm of breast: Secondary | ICD-10-CM | POA: Diagnosis not present

## 2022-01-04 DIAGNOSIS — M5416 Radiculopathy, lumbar region: Secondary | ICD-10-CM | POA: Diagnosis not present

## 2022-01-11 DIAGNOSIS — I10 Essential (primary) hypertension: Secondary | ICD-10-CM | POA: Diagnosis not present

## 2022-01-11 DIAGNOSIS — Z299 Encounter for prophylactic measures, unspecified: Secondary | ICD-10-CM | POA: Diagnosis not present

## 2022-01-11 DIAGNOSIS — M5127 Other intervertebral disc displacement, lumbosacral region: Secondary | ICD-10-CM | POA: Diagnosis not present

## 2022-01-22 DIAGNOSIS — H16223 Keratoconjunctivitis sicca, not specified as Sjogren's, bilateral: Secondary | ICD-10-CM | POA: Diagnosis not present

## 2022-01-22 DIAGNOSIS — Z961 Presence of intraocular lens: Secondary | ICD-10-CM | POA: Diagnosis not present

## 2022-01-22 DIAGNOSIS — H52229 Regular astigmatism, unspecified eye: Secondary | ICD-10-CM | POA: Diagnosis not present

## 2022-01-22 DIAGNOSIS — H18453 Nodular corneal degeneration, bilateral: Secondary | ICD-10-CM | POA: Diagnosis not present

## 2022-01-22 DIAGNOSIS — H43399 Other vitreous opacities, unspecified eye: Secondary | ICD-10-CM | POA: Diagnosis not present

## 2022-02-26 ENCOUNTER — Other Ambulatory Visit: Payer: Self-pay | Admitting: Orthopedic Surgery

## 2022-02-26 DIAGNOSIS — M545 Other chronic pain: Secondary | ICD-10-CM

## 2022-03-05 ENCOUNTER — Ambulatory Visit
Admission: RE | Admit: 2022-03-05 | Discharge: 2022-03-05 | Disposition: A | Discharge status: Home / Self Care | Payer: Medicare Other | Source: Ambulatory Visit | Attending: Orthopedic Surgery | Admitting: Orthopedic Surgery

## 2022-03-05 DIAGNOSIS — G8929 Other chronic pain: Secondary | ICD-10-CM

## 2022-03-05 DIAGNOSIS — M47817 Spondylosis without myelopathy or radiculopathy, lumbosacral region: Secondary | ICD-10-CM | POA: Diagnosis not present

## 2022-03-05 MED ORDER — METHYLPREDNISOLONE ACETATE 40 MG/ML INJ SUSP (RADIOLOG
80.0000 mg | Freq: Once | INTRAMUSCULAR | Status: AC
  Administered 2022-03-05: 80 mg via EPIDURAL

## 2022-03-05 MED ORDER — IOPAMIDOL (ISOVUE-M 200) INJECTION 41%
1.0000 mL | Freq: Once | INTRAMUSCULAR | Status: AC
  Administered 2022-03-05: 1 mL via EPIDURAL

## 2022-04-15 DIAGNOSIS — M79605 Pain in left leg: Secondary | ICD-10-CM | POA: Diagnosis not present

## 2022-04-15 DIAGNOSIS — M79604 Pain in right leg: Secondary | ICD-10-CM | POA: Diagnosis not present

## 2022-04-15 DIAGNOSIS — I10 Essential (primary) hypertension: Secondary | ICD-10-CM | POA: Diagnosis not present

## 2022-04-15 DIAGNOSIS — M545 Low back pain, unspecified: Secondary | ICD-10-CM | POA: Diagnosis not present

## 2022-04-15 DIAGNOSIS — Z6833 Body mass index (BMI) 33.0-33.9, adult: Secondary | ICD-10-CM | POA: Diagnosis not present

## 2022-04-15 DIAGNOSIS — Z299 Encounter for prophylactic measures, unspecified: Secondary | ICD-10-CM | POA: Diagnosis not present

## 2022-05-20 ENCOUNTER — Other Ambulatory Visit: Payer: Self-pay | Admitting: Orthopedic Surgery

## 2022-05-20 DIAGNOSIS — G8929 Other chronic pain: Secondary | ICD-10-CM

## 2022-05-24 ENCOUNTER — Ambulatory Visit
Admission: RE | Admit: 2022-05-24 | Discharge: 2022-05-24 | Disposition: A | Discharge status: Home / Self Care | Payer: Medicare Other | Source: Ambulatory Visit | Attending: Orthopedic Surgery | Admitting: Orthopedic Surgery

## 2022-05-24 DIAGNOSIS — M4727 Other spondylosis with radiculopathy, lumbosacral region: Secondary | ICD-10-CM | POA: Diagnosis not present

## 2022-05-24 DIAGNOSIS — G8929 Other chronic pain: Secondary | ICD-10-CM

## 2022-05-24 MED ORDER — METHYLPREDNISOLONE ACETATE 40 MG/ML INJ SUSP (RADIOLOG
80.0000 mg | Freq: Once | INTRAMUSCULAR | Status: AC
  Administered 2022-05-24: 80 mg via EPIDURAL

## 2022-05-24 MED ORDER — IOPAMIDOL (ISOVUE-M 200) INJECTION 41%
1.0000 mL | Freq: Once | INTRAMUSCULAR | Status: AC
  Administered 2022-05-24: 1 mL via EPIDURAL

## 2022-05-24 NOTE — Discharge Instructions (Signed)

## 2022-07-30 ENCOUNTER — Other Ambulatory Visit: Payer: Self-pay | Admitting: Orthopedic Surgery

## 2022-07-30 DIAGNOSIS — M545 Low back pain, unspecified: Secondary | ICD-10-CM

## 2022-08-07 ENCOUNTER — Ambulatory Visit
Admission: RE | Admit: 2022-08-07 | Discharge: 2022-08-07 | Disposition: A | Discharge status: Home / Self Care | Payer: Medicare Other | Source: Ambulatory Visit | Attending: Orthopedic Surgery | Admitting: Orthopedic Surgery

## 2022-08-07 DIAGNOSIS — M47817 Spondylosis without myelopathy or radiculopathy, lumbosacral region: Secondary | ICD-10-CM | POA: Diagnosis not present

## 2022-08-07 DIAGNOSIS — G8929 Other chronic pain: Secondary | ICD-10-CM

## 2022-08-07 MED ORDER — IOPAMIDOL (ISOVUE-M 200) INJECTION 41%
1.0000 mL | Freq: Once | INTRAMUSCULAR | Status: AC
  Administered 2022-08-07: 1 mL via EPIDURAL

## 2022-08-07 MED ORDER — METHYLPREDNISOLONE ACETATE 40 MG/ML INJ SUSP (RADIOLOG
80.0000 mg | Freq: Once | INTRAMUSCULAR | Status: AC
  Administered 2022-08-07: 80 mg via EPIDURAL

## 2022-08-07 NOTE — Discharge Instructions (Signed)

## 2022-08-12 DIAGNOSIS — M545 Low back pain, unspecified: Secondary | ICD-10-CM | POA: Diagnosis not present

## 2022-08-12 DIAGNOSIS — M1990 Unspecified osteoarthritis, unspecified site: Secondary | ICD-10-CM | POA: Diagnosis not present

## 2022-08-12 DIAGNOSIS — I10 Essential (primary) hypertension: Secondary | ICD-10-CM | POA: Diagnosis not present

## 2022-08-12 DIAGNOSIS — Z299 Encounter for prophylactic measures, unspecified: Secondary | ICD-10-CM | POA: Diagnosis not present

## 2022-08-26 DIAGNOSIS — Z299 Encounter for prophylactic measures, unspecified: Secondary | ICD-10-CM | POA: Diagnosis not present

## 2022-08-26 DIAGNOSIS — R0981 Nasal congestion: Secondary | ICD-10-CM | POA: Diagnosis not present

## 2022-08-26 DIAGNOSIS — J069 Acute upper respiratory infection, unspecified: Secondary | ICD-10-CM | POA: Diagnosis not present

## 2022-10-07 DIAGNOSIS — I10 Essential (primary) hypertension: Secondary | ICD-10-CM | POA: Diagnosis not present

## 2022-10-07 DIAGNOSIS — M545 Low back pain, unspecified: Secondary | ICD-10-CM | POA: Diagnosis not present

## 2022-10-07 DIAGNOSIS — D692 Other nonthrombocytopenic purpura: Secondary | ICD-10-CM | POA: Diagnosis not present

## 2022-10-07 DIAGNOSIS — Z6834 Body mass index (BMI) 34.0-34.9, adult: Secondary | ICD-10-CM | POA: Diagnosis not present

## 2022-10-07 DIAGNOSIS — Z7189 Other specified counseling: Secondary | ICD-10-CM | POA: Diagnosis not present

## 2022-10-07 DIAGNOSIS — Z299 Encounter for prophylactic measures, unspecified: Secondary | ICD-10-CM | POA: Diagnosis not present

## 2022-10-07 DIAGNOSIS — R5383 Other fatigue: Secondary | ICD-10-CM | POA: Diagnosis not present

## 2022-10-07 DIAGNOSIS — Z1331 Encounter for screening for depression: Secondary | ICD-10-CM | POA: Diagnosis not present

## 2022-10-07 DIAGNOSIS — E7800 Pure hypercholesterolemia, unspecified: Secondary | ICD-10-CM | POA: Diagnosis not present

## 2022-10-07 DIAGNOSIS — Z0000 Encounter for general adult medical examination without abnormal findings: Secondary | ICD-10-CM | POA: Diagnosis not present

## 2022-10-07 DIAGNOSIS — Z1339 Encounter for screening examination for other mental health and behavioral disorders: Secondary | ICD-10-CM | POA: Diagnosis not present

## 2022-10-07 DIAGNOSIS — Z79899 Other long term (current) drug therapy: Secondary | ICD-10-CM | POA: Diagnosis not present

## 2022-11-01 ENCOUNTER — Other Ambulatory Visit: Payer: Self-pay | Admitting: Orthopedic Surgery

## 2022-11-01 DIAGNOSIS — G8929 Other chronic pain: Secondary | ICD-10-CM

## 2022-11-14 ENCOUNTER — Ambulatory Visit
Admission: RE | Admit: 2022-11-14 | Discharge: 2022-11-14 | Disposition: A | Discharge status: Home / Self Care | Payer: Medicare Other | Source: Ambulatory Visit | Attending: Orthopedic Surgery | Admitting: Orthopedic Surgery

## 2022-11-14 DIAGNOSIS — M545 Low back pain, unspecified: Secondary | ICD-10-CM

## 2022-11-14 DIAGNOSIS — M47817 Spondylosis without myelopathy or radiculopathy, lumbosacral region: Secondary | ICD-10-CM | POA: Diagnosis not present

## 2022-11-14 MED ORDER — METHYLPREDNISOLONE ACETATE 40 MG/ML INJ SUSP (RADIOLOG
80.0000 mg | Freq: Once | INTRAMUSCULAR | Status: AC
  Administered 2022-11-14: 80 mg via EPIDURAL

## 2022-11-14 MED ORDER — IOPAMIDOL (ISOVUE-M 200) INJECTION 41%
1.0000 mL | Freq: Once | INTRAMUSCULAR | Status: AC
  Administered 2022-11-14: 1 mL via EPIDURAL

## 2022-11-14 NOTE — Discharge Instructions (Signed)

## 2022-12-03 DIAGNOSIS — E2839 Other primary ovarian failure: Secondary | ICD-10-CM | POA: Diagnosis not present

## 2022-12-09 DIAGNOSIS — Z1231 Encounter for screening mammogram for malignant neoplasm of breast: Secondary | ICD-10-CM | POA: Diagnosis not present

## 2023-01-20 ENCOUNTER — Other Ambulatory Visit: Payer: Self-pay | Admitting: Orthopedic Surgery

## 2023-01-20 DIAGNOSIS — M545 Low back pain, unspecified: Secondary | ICD-10-CM

## 2023-01-23 ENCOUNTER — Ambulatory Visit
Admission: RE | Admit: 2023-01-23 | Discharge: 2023-01-23 | Disposition: A | Discharge status: Home / Self Care | Payer: Medicare Other | Source: Ambulatory Visit | Attending: Orthopedic Surgery | Admitting: Orthopedic Surgery

## 2023-01-23 DIAGNOSIS — M545 Low back pain, unspecified: Secondary | ICD-10-CM

## 2023-01-23 DIAGNOSIS — M47816 Spondylosis without myelopathy or radiculopathy, lumbar region: Secondary | ICD-10-CM | POA: Diagnosis not present

## 2023-01-23 MED ORDER — METHYLPREDNISOLONE ACETATE 40 MG/ML INJ SUSP (RADIOLOG
80.0000 mg | Freq: Once | INTRAMUSCULAR | Status: AC
  Administered 2023-01-23: 80 mg via EPIDURAL

## 2023-01-23 MED ORDER — IOPAMIDOL (ISOVUE-M 200) INJECTION 41%
1.0000 mL | Freq: Once | INTRAMUSCULAR | Status: AC
  Administered 2023-01-23: 1 mL via EPIDURAL

## 2023-01-23 NOTE — Discharge Instructions (Signed)

## 2023-02-11 DIAGNOSIS — I10 Essential (primary) hypertension: Secondary | ICD-10-CM | POA: Diagnosis not present

## 2023-02-11 DIAGNOSIS — M1990 Unspecified osteoarthritis, unspecified site: Secondary | ICD-10-CM | POA: Diagnosis not present

## 2023-02-11 DIAGNOSIS — M545 Low back pain, unspecified: Secondary | ICD-10-CM | POA: Diagnosis not present

## 2023-02-11 DIAGNOSIS — Z299 Encounter for prophylactic measures, unspecified: Secondary | ICD-10-CM | POA: Diagnosis not present

## 2023-03-09 IMAGING — MR MR LUMBAR SPINE W/O CM
4 of 5 series · 18 of 48 positions shown · non-contrast
Comparison: Lumbar myelogram 03/02/2019

CLINICAL DATA: Chronic and persistent low back pain with right leg
numbness and weakness

EXAM:
MRI LUMBAR SPINE WITHOUT CONTRAST
TECHNIQUE: Multiplanar, multisequence MR imaging of the lumbar spine was
performed. No intravenous contrast was administered.

[Series 5: T2 · sagittal · 4.0mm · 0.73mm/px · 6 of 13 slices shown (1 of 2)]
[im 1/13]
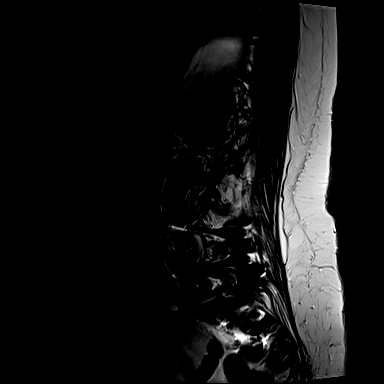
[im 3/13]
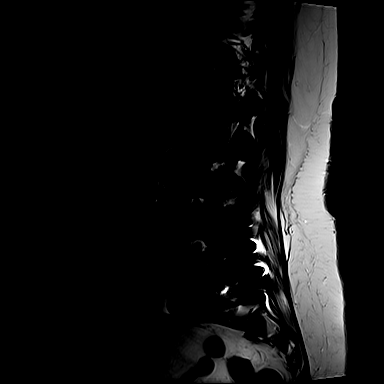
[im 5/13]
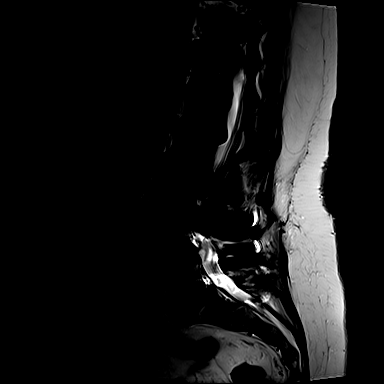
[im 8/13]
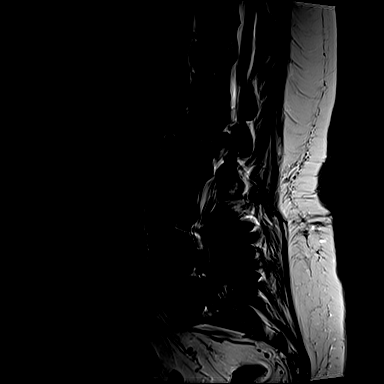
[im 10/13]
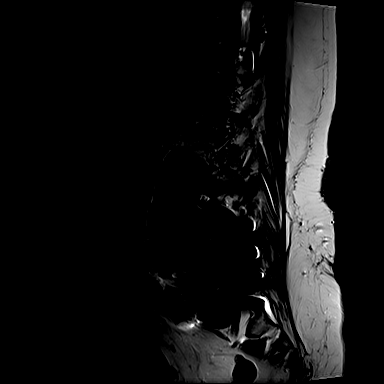
[im 13/13]
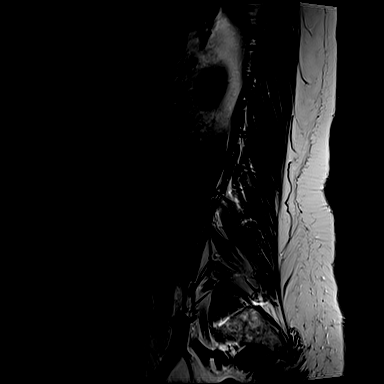

[Series 6: T1 · sagittal · 4.0mm · 0.73mm/px · 3 of 13 slices shown (1 of 2)]
[im 1/13]
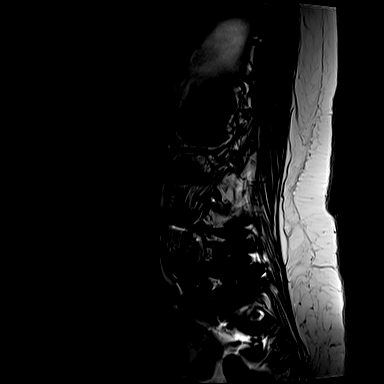
[im 7/13]
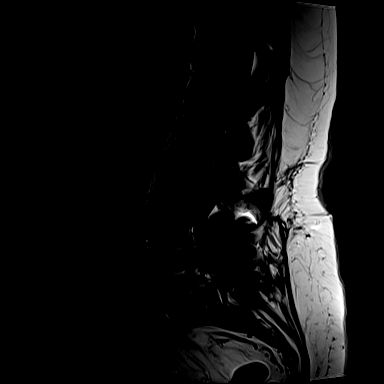
[im 13/13]
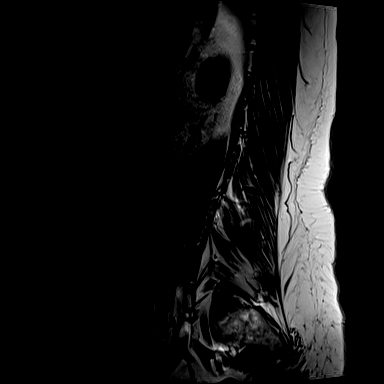

[Series 10: T2 · axial · 4.0mm · 0.28mm/px · z∈[-77,+110]mm · 6 of 42 slices shown (2 of 2)]
[im 3/42]
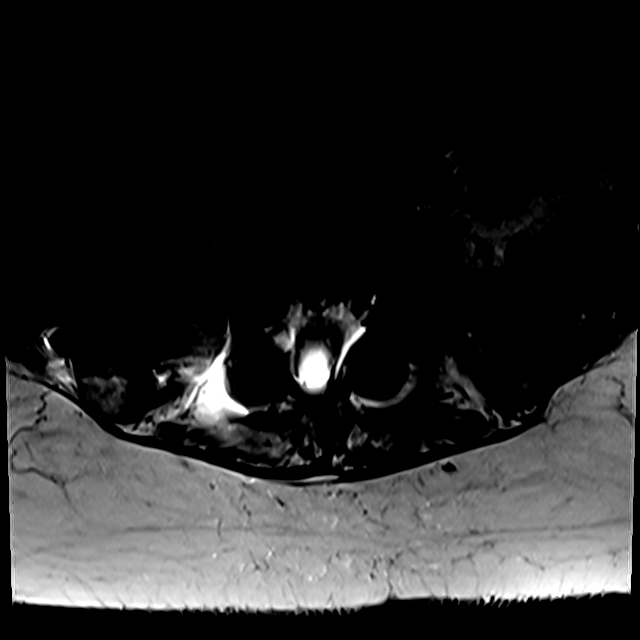
[im 6/42]
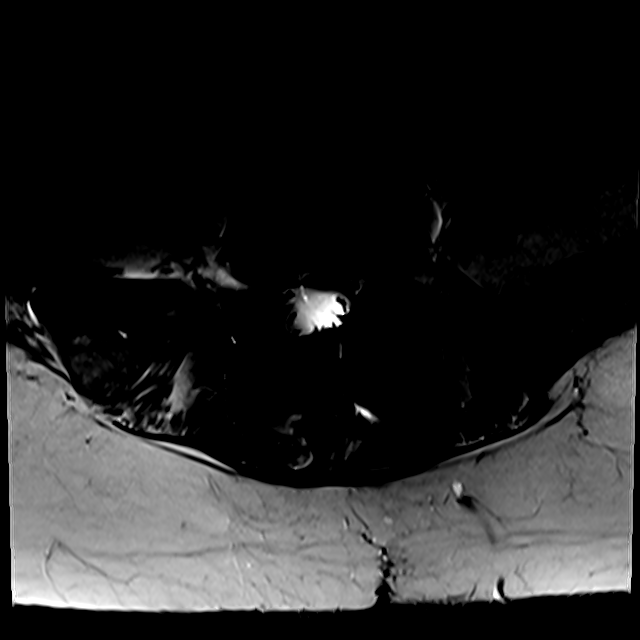
[im 9/42]
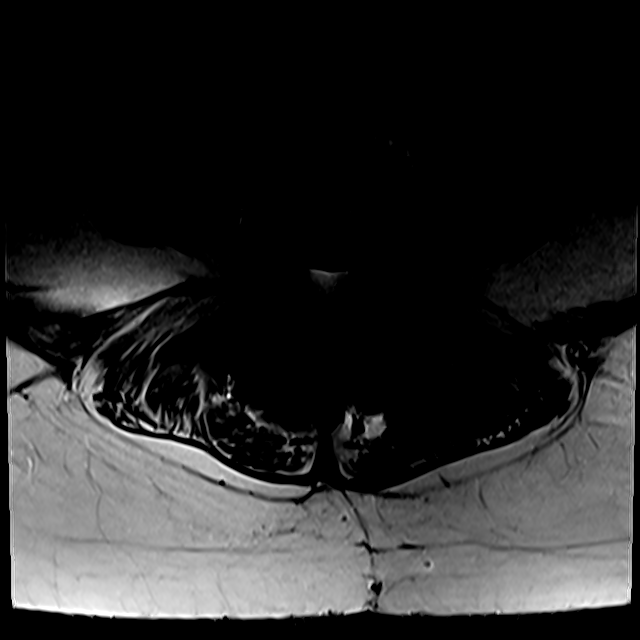
[im 14/42]
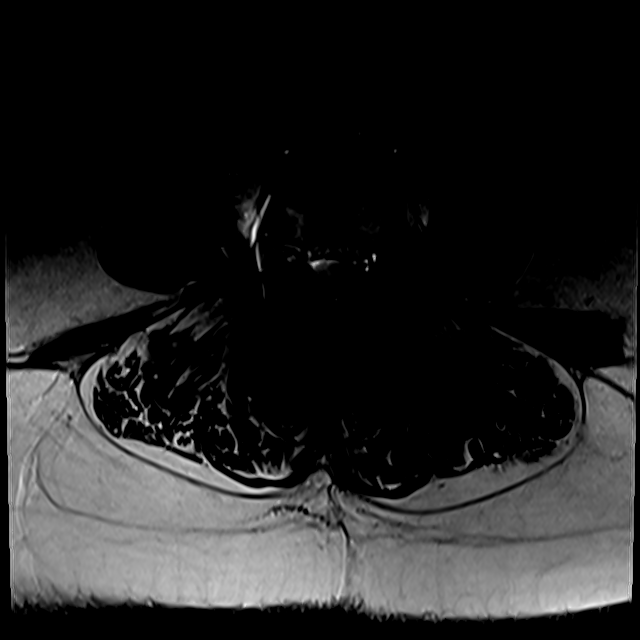
[im 22/42]
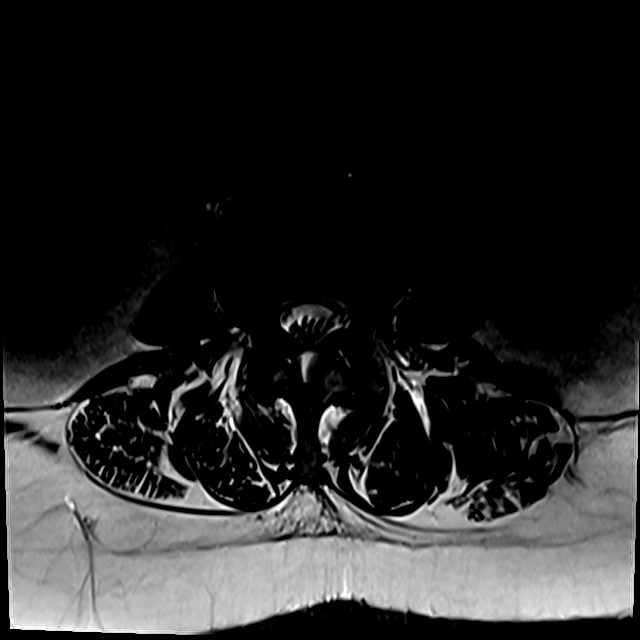
[im 36/42]
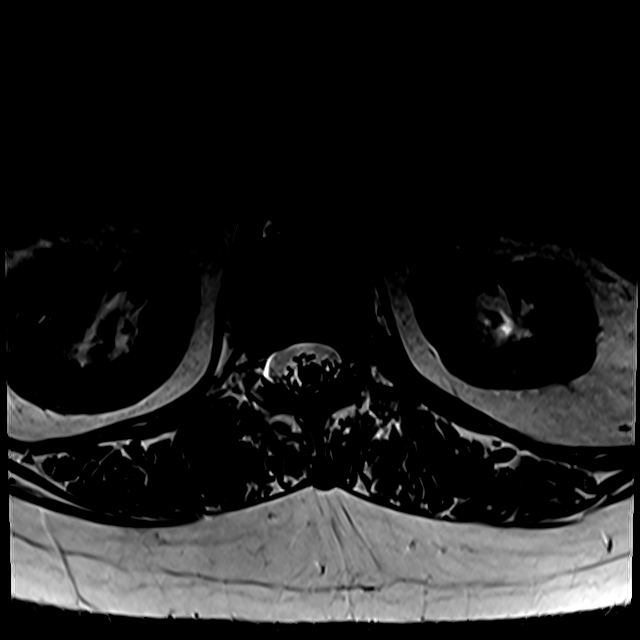

[Series 100: T1 · axial · 4.0mm · 0.28mm/px · z∈[-62,+110]mm · 3 of 42 slices shown (2 of 2)]
[im 6/42]
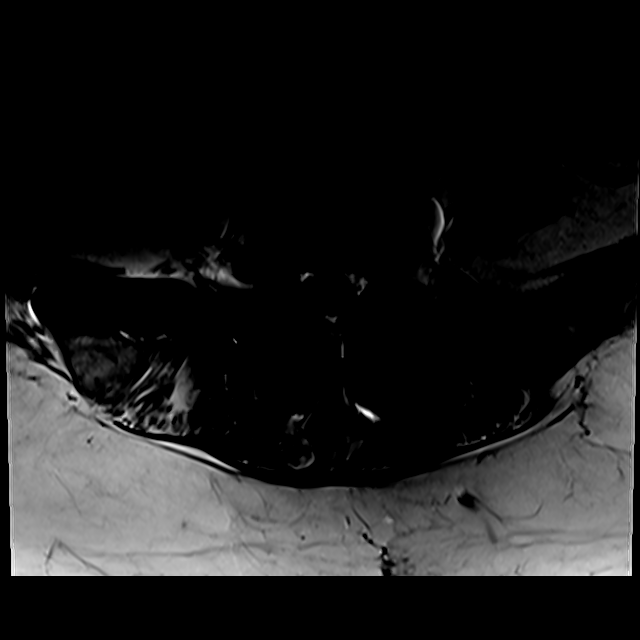
[im 22/42]
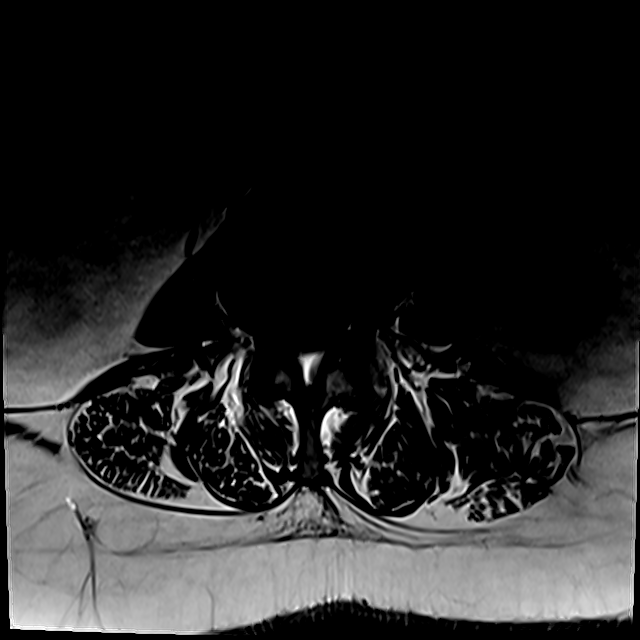
[im 36/42]
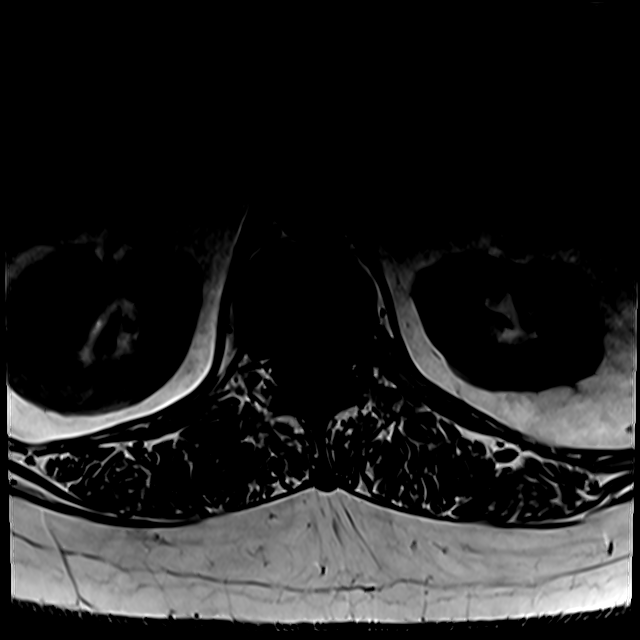

[18 of 48 positions shown; findings below may reference images not displayed]

FINDINGS: Segmentation:  5 lumbar type vertebra

Alignment: Reversal of lumbar lordosis in the superior lumbar spine.
Mild retrolisthesis at L1-2 and L2-3.

Vertebrae:  L4-5 and L5-S1 PLIF.  Right sacroiliac arthrodesis.

Conus medullaris and cauda equina: Conus extends to the L1 level.
Conus and cauda equina appear normal.

Paraspinal and other soft tissues: No evidence of mass or
inflammation.

Disc levels:

T11-12: Disc narrowing with right paracentral protrusion.

T12- L1: Unremarkable.

L1-L2: Disc collapse with endplate degeneration. Disc bulging and
ridging eccentric to the left with mild left facet spurring.
Moderate left foraminal narrowing.

L2-L3: Disc narrowing and bulging with right inferior foraminal
protrusion. Bilateral facet spurring. Mild spinal stenosis.

L3-L4: Disc narrowing and bulging with right eccentric protrusion
most notable at the foramen. Degenerative facet spurring and
ligamentum flavum thickening. Compressive spinal and right foraminal
stenosis.

L4-L5: PLIF.  No impingement

L5-S1:PLIF. No impingement by prior myelogram. The foramina are
largely obscured by metallic artifact on today's scan.
IMPRESSION: 1. Adjacent segment degeneration at L3-4 causing compressive spinal
and right foraminal stenosis that is progressed from the 0000
myelogram.
2. Noncompressive degenerative changes above this level are similar
to prior.
3. L4-S1 PLIF.

## 2023-04-11 ENCOUNTER — Other Ambulatory Visit: Payer: Self-pay | Admitting: Orthopedic Surgery

## 2023-04-11 DIAGNOSIS — M545 Low back pain, unspecified: Secondary | ICD-10-CM

## 2023-04-15 NOTE — Discharge Instructions (Signed)

## 2023-04-16 ENCOUNTER — Ambulatory Visit
Admission: RE | Admit: 2023-04-16 | Discharge: 2023-04-16 | Disposition: A | Discharge status: Home / Self Care | Payer: Medicare Other | Source: Ambulatory Visit | Attending: Orthopedic Surgery | Admitting: Orthopedic Surgery

## 2023-04-16 DIAGNOSIS — M4727 Other spondylosis with radiculopathy, lumbosacral region: Secondary | ICD-10-CM | POA: Diagnosis not present

## 2023-04-16 DIAGNOSIS — M545 Low back pain, unspecified: Secondary | ICD-10-CM

## 2023-04-16 MED ORDER — METHYLPREDNISOLONE ACETATE 40 MG/ML INJ SUSP (RADIOLOG
80.0000 mg | Freq: Once | INTRAMUSCULAR | Status: AC
  Administered 2023-04-16: 80 mg via EPIDURAL

## 2023-04-16 MED ORDER — IOPAMIDOL (ISOVUE-M 200) INJECTION 41%
1.0000 mL | Freq: Once | INTRAMUSCULAR | Status: AC
  Administered 2023-04-16: 1 mL via EPIDURAL

## 2023-05-11 DIAGNOSIS — J8410 Pulmonary fibrosis, unspecified: Secondary | ICD-10-CM

## 2023-05-11 HISTORY — DX: Pulmonary fibrosis, unspecified: J84.10

## 2023-05-21 DIAGNOSIS — I10 Essential (primary) hypertension: Secondary | ICD-10-CM | POA: Diagnosis not present

## 2023-05-21 DIAGNOSIS — M1990 Unspecified osteoarthritis, unspecified site: Secondary | ICD-10-CM | POA: Diagnosis not present

## 2023-05-21 DIAGNOSIS — Z299 Encounter for prophylactic measures, unspecified: Secondary | ICD-10-CM | POA: Diagnosis not present

## 2023-05-21 DIAGNOSIS — R918 Other nonspecific abnormal finding of lung field: Secondary | ICD-10-CM | POA: Diagnosis not present

## 2023-05-21 DIAGNOSIS — R0602 Shortness of breath: Secondary | ICD-10-CM | POA: Diagnosis not present

## 2023-05-21 DIAGNOSIS — R0989 Other specified symptoms and signs involving the circulatory and respiratory systems: Secondary | ICD-10-CM | POA: Diagnosis not present

## 2023-05-21 DIAGNOSIS — M545 Low back pain, unspecified: Secondary | ICD-10-CM | POA: Diagnosis not present

## 2023-05-21 DIAGNOSIS — R0600 Dyspnea, unspecified: Secondary | ICD-10-CM | POA: Diagnosis not present

## 2023-06-09 DIAGNOSIS — R0602 Shortness of breath: Secondary | ICD-10-CM | POA: Diagnosis not present

## 2023-06-11 DIAGNOSIS — Z23 Encounter for immunization: Secondary | ICD-10-CM | POA: Diagnosis not present

## 2023-06-11 DIAGNOSIS — I10 Essential (primary) hypertension: Secondary | ICD-10-CM | POA: Diagnosis not present

## 2023-06-11 DIAGNOSIS — R9389 Abnormal findings on diagnostic imaging of other specified body structures: Secondary | ICD-10-CM | POA: Diagnosis not present

## 2023-06-11 DIAGNOSIS — I700 Atherosclerosis of aorta: Secondary | ICD-10-CM | POA: Diagnosis not present

## 2023-06-11 DIAGNOSIS — Z299 Encounter for prophylactic measures, unspecified: Secondary | ICD-10-CM | POA: Diagnosis not present

## 2023-06-17 DIAGNOSIS — R0602 Shortness of breath: Secondary | ICD-10-CM | POA: Diagnosis not present

## 2023-06-17 DIAGNOSIS — R918 Other nonspecific abnormal finding of lung field: Secondary | ICD-10-CM | POA: Diagnosis not present

## 2023-06-17 DIAGNOSIS — R9389 Abnormal findings on diagnostic imaging of other specified body structures: Secondary | ICD-10-CM | POA: Diagnosis not present

## 2023-06-17 DIAGNOSIS — Q677 Pectus carinatum: Secondary | ICD-10-CM | POA: Diagnosis not present

## 2023-06-17 DIAGNOSIS — J849 Interstitial pulmonary disease, unspecified: Secondary | ICD-10-CM | POA: Diagnosis not present

## 2023-06-30 NOTE — Progress Notes (Signed)
Updated medications and medical hx

## 2023-07-01 ENCOUNTER — Ambulatory Visit: Payer: Medicare Other | Attending: Internal Medicine | Admitting: Internal Medicine

## 2023-07-01 ENCOUNTER — Encounter: Payer: Self-pay | Admitting: Internal Medicine

## 2023-07-01 VITALS — BP 132/80 | HR 86 | Ht 62.0 in | Wt 180.6 lb

## 2023-07-01 DIAGNOSIS — Z01810 Encounter for preprocedural cardiovascular examination: Secondary | ICD-10-CM | POA: Diagnosis not present

## 2023-07-01 DIAGNOSIS — R079 Chest pain, unspecified: Secondary | ICD-10-CM | POA: Diagnosis not present

## 2023-07-01 DIAGNOSIS — J849 Interstitial pulmonary disease, unspecified: Secondary | ICD-10-CM | POA: Insufficient documentation

## 2023-07-01 DIAGNOSIS — R072 Precordial pain: Secondary | ICD-10-CM | POA: Insufficient documentation

## 2023-07-01 DIAGNOSIS — R0602 Shortness of breath: Secondary | ICD-10-CM | POA: Insufficient documentation

## 2023-07-01 DIAGNOSIS — R0609 Other forms of dyspnea: Secondary | ICD-10-CM | POA: Insufficient documentation

## 2023-07-01 MED ORDER — METOPROLOL TARTRATE 100 MG PO TABS: 100.0000 mg | ORAL_TABLET | Freq: Once | ORAL | 0 refills | Status: DC

## 2023-07-01 NOTE — Patient Instructions (Addendum)
Medication Instructions:  Your physician recommends that you continue on your current medications as directed. Please refer to the Current Medication list given to you today.   Labwork: BNP to be completed today at [facility] [place] 1-2 weeks prior to CTA at [facility]  Testing/Procedures:   Your cardiac CT will be scheduled at one of the below locations:   [facility] [address] [place], [place] [address] [phone]   If scheduled at [facility], please arrive at the [facility] and Children's Entrance (Entrance C2) of [facility] 30 minutes prior to test start time. You can use the FREE valet parking offered at entrance C (encouraged to control the heart rate for the test)  Proceed to the [facility] Radiology Department (first floor) to check-in and test prep.  All radiology patients and guests should use entrance C2 at [facility], accessed from [facility], even though the hospital's physical address listed is [address].     There is spacious parking and easy access to the radiology department from the [facility] entrance. Please enter here and check-in with the desk attendant.   Please follow these instructions carefully (unless otherwise directed):  An IV will be required for this test and Nitroglycerin will be given.   On the Night Before the Test: Be sure to Drink plenty of water. Do not consume any caffeinated/decaffeinated beverages or chocolate 12 hours prior to your test. Do not take any antihistamines 12 hours prior to your test. If the patient has contrast allergy: No allergy  On the Day of the Test: Drink plenty of water until 1 hour prior to the test. Do not eat any food 1 hour prior to test. You may take your regular medications prior to the test.  Take metoprolol 100 mg(Lopressor) two hours prior to test. If you take  Furosemide/Hydrochlorothiazide/Spironolactone, please HOLD on the morning of the test.-Hold Triamterene-hydrochlorothiazide  FEMALES- please wear underwire-free bra if available, avoid dresses & tight clothing     After the Test: Drink plenty of water. After receiving IV contrast, you may experience a mild flushed feeling. This is normal. On occasion, you may experience a mild rash up to 24 hours after the test. This is not dangerous. If this occurs, you can take Benadryl 25 mg and increase your fluid intake. If you experience trouble breathing, this can be serious. If it is severe call 911 IMMEDIATELY. If it is mild, please call our office. We will call to schedule your test 2-4 weeks out understanding that some insurance companies will need an authorization prior to the service being performed.   For more information and frequently asked questions, please visit our website : [email or web address]  For non-scheduling related questions, please contact the cardiac imaging nurse navigator should you have any questions/concerns: Cardiac Imaging Nurse Navigators Direct Office Dial: [phone]   For scheduling needs, including cancellations and rescheduling, please call [place], [phone].   Follow-Up: Your physician recommends that you schedule a follow-up appointment in: Pending Results  Any Other Special Instructions Will Be Listed Below (If Applicable).  If you need a refill on your cardiac medications before your next appointment, please call your pharmacy.

## 2023-07-01 NOTE — Progress Notes (Signed)
Cardiology Office Note  Date: 07/01/2023   ID: Kristin Bush, DOB 06-03-1951, MRN [id number]  PCP:  Ignatius Specking, MD  Cardiologist:  Marjo Bicker, MD Electrophysiologist:  None   History of Present Illness: Kristin Bush is a 72 y.o. female known to have HTN, ILD was referred to cardiology clinic for evaluation of DOE.  Ongoing DOE since August 2020 for severe chest tightness.  She underwent CT chest that showed ILD, she also has a family history of ILD (4 family members were diagnosed with ILD).  No other symptoms of dizziness, orthopnea, PND, leg swelling, syncope or palpitations were noticed.  She underwent echocardiogram with her PCP, she was told echo was normal, will obtain echo report from the PCP.  Past Medical History:  Diagnosis Date   Arthritis    Depression    Dyspnea    Hypertension    SI (sacroiliac) joint dysfunction    Spondylolisthesis    Wears glasses    Wears partial dentures     Past Surgical History:  Procedure Laterality Date   ABDOMINAL HYSTERECTOMY     BACK SURGERY  05/28/2018   PLIF   CATARACT EXTRACTION W/PHACO Left 10/06/2014   Procedure: CATARACT EXTRACTION PHACO AND INTRAOCULAR LENS PLACEMENT LEFT EYE;  Surgeon: Gemma Payor, MD;  Location: [facility];  Service: Ophthalmology;  Laterality: Left;  CDE:5.49   CATARACT EXTRACTION W/PHACO Right 10/17/2014   Procedure: CATARACT EXTRACTION PHACO AND INTRAOCULAR LENS PLACEMENT RIGHT EYE CDE=9.81;  Surgeon: Gemma Payor, MD;  Location: [facility];  Service: Ophthalmology;  Laterality: Right;   MULTIPLE TOOTH EXTRACTIONS     ROTATOR CUFF REPAIR     right shoulder   SACROILIAC JOINT FUSION Right 11/18/2019   Procedure: RIGHT SACROILIAC JOINT FUSION;  Surgeon: Estill Bamberg, MD;  Location: [facility];  Service: Orthopedics;  Laterality: Right;   SHOULDER ARTHROSCOPY WITH SUBACROMIAL DECOMPRESSION, ROTATOR CUFF REPAIR AND BICEP TENDON REPAIR Left 09/17/2018   Procedure: LEFT SHOULDER ARTHROSCOPY WITH  DEBRIDEMENT, SUBACROMIAL DECOMPRESSION, ROTATOR CUFF;  Surgeon: Bjorn Pippin, MD;  Location: [facility];  Service: Orthopedics;  Laterality: Left;   TRANSFORAMINAL LUMBAR INTERBODY FUSION (TLIF) WITH PEDICLE SCREW FIXATION 1 LEVEL Right 08/09/2021   Procedure: RIGHT-SIDED LUMBAR 3 - LUMBAR 4 TRANSFORAMINAL LUMBAR INTERBODY FUSION WITH INSTRUMENTATION AND ALLOGRAFT;  Surgeon: Estill Bamberg, MD;  Location: [facility];  Service: Orthopedics;  Laterality: Right;   TUBAL LIGATION      Current Outpatient Medications  Medication Sig Dispense Refill   gabapentin (NEURONTIN) 300 MG capsule Take 300 mg by mouth 2 (two) times daily.     Homeopathic Products (PROSACEA) GEL Apply 1 application topically daily as needed (rosacea).     meloxicam (MOBIC) 7.5 MG tablet Take 7.5 mg by mouth daily.     [START ON 07/03/2023] metoprolol tartrate (LOPRESSOR) 100 MG tablet Take 1 tablet (100 mg total) by mouth once for 1 dose. 2 hours prior to procedure 1 tablet 0   traMADol (ULTRAM) 50 MG tablet Take 50 mg by mouth 2 (two) times daily as needed.     triamterene-hydrochlorothiazide (DYAZIDE) 37.5-25 MG capsule Take 1 tablet by mouth daily.      No current facility-administered medications for this visit.   Allergies:  Patient has no known allergies.   Social History: The patient  reports that she has never smoked. She has never used smokeless tobacco. She reports that she does not drink alcohol and does not use drugs.   Family History:  The patient's family history includes Aneurysm in her father; Diabetes in her sister; Heart attack in her brother; Hypertension in her sister; Pulmonary fibrosis in her mother.   ROS:  Please see the history of present illness. Otherwise, complete review of systems is positive for none  All other systems are reviewed and negative.   Physical Exam: VS:  BP 132/80   Pulse 86   Ht 5\' 2"  (1.575 m)   Wt 180 lb 9.6 oz (81.9 kg)   SpO2 97%   BMI 33.03 kg/m , BMI Body  mass index is 33.03 kg/m.  Wt Readings from Last 3 Encounters:  07/01/23 180 lb 9.6 oz (81.9 kg)  08/09/21 184 lb (83.5 kg)  07/31/21 184 lb 4.8 oz (83.6 kg)    General: Patient appears comfortable at rest. HEENT: Conjunctiva and lids normal, oropharynx clear with moist mucosa. Neck: Supple, no elevated JVP or carotid bruits, no thyromegaly. Lungs: Clear to auscultation, nonlabored breathing at rest. Cardiac: Regular rate and rhythm, no S3 or significant systolic murmur, no pericardial rub. Abdomen: Soft, nontender, no hepatomegaly, bowel sounds present, no guarding or rebound. Extremities: No pitting edema, distal pulses 2+. Skin: Warm and dry. Musculoskeletal: No kyphosis. Neuropsychiatric: Alert and oriented x3, affect grossly appropriate.  Recent Labwork: No results found for requested labs within last 365 days.  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  No orthopnea, PND, unlikely to be CHF but will obtain BNP levels.  She underwent echocardiogram with her PCP, I do not have echo report axis, will obtain echo report from the PCP.  Will also obtain CT cardiac to rule out any significant CAD (due to symptoms of exertional chest tightness and poor R wave progression on the EKG). -CT chest showed ILD, she does have a family history of ILD (4 family members so far were diagnosed with ILD).  She has an upcoming appointment with her PCP to discuss about these findings. -Continue current antihypertensive, triamterene-HCTZ 37.5-25 mg once daily, follows with PCP for HTN management.    I have spent a total duration of 45 minutes reviewing prior records including PCPs records, records from [facility], labs, imaging studies, face-to-face  discussion/counseling of her medical condition, pathophysiology, evaluation, management, answering all her questions and documenting the findings in the note.   Disposition:  Follow up  pending results  Signed Dori Devino Verne Spurr, MD, 07/01/2023 4:24 PM    [facility] at [facility] [address] [place], [place], [place] [address]

## 2023-07-03 DIAGNOSIS — J849 Interstitial pulmonary disease, unspecified: Secondary | ICD-10-CM | POA: Diagnosis not present

## 2023-07-03 DIAGNOSIS — R0602 Shortness of breath: Secondary | ICD-10-CM | POA: Diagnosis not present

## 2023-07-03 DIAGNOSIS — I10 Essential (primary) hypertension: Secondary | ICD-10-CM | POA: Diagnosis not present

## 2023-07-03 DIAGNOSIS — Z299 Encounter for prophylactic measures, unspecified: Secondary | ICD-10-CM | POA: Diagnosis not present

## 2023-07-07 ENCOUNTER — Telehealth: Payer: Self-pay

## 2023-07-07 NOTE — Telephone Encounter (Signed)
Patient informed and verbalized understanding of plan. 

## 2023-07-07 NOTE — Telephone Encounter (Signed)
-----   Message from Vishnu P Mallipeddi sent at 07/04/2023 12:14 PM EDT ----- Normal BNP.  DOE unlikely to be cardiac in origin.

## 2023-07-10 DIAGNOSIS — R0609 Other forms of dyspnea: Secondary | ICD-10-CM | POA: Diagnosis not present

## 2023-07-22 ENCOUNTER — Telehealth (HOSPITAL_COMMUNITY): Payer: Self-pay | Admitting: *Deleted

## 2023-07-22 NOTE — Telephone Encounter (Signed)
Reaching out to patient to offer assistance regarding upcoming cardiac imaging study; pt verbalizes understanding of appt date/time, parking situation and where to check in, pre-test NPO status and medications ordered, and verified current allergies; name and call back number provided for further questions should they arise  Larey Brick RN Navigator Cardiac Imaging [facility] [phone] office [phone] cell  Patient to take 100mg  metoprolol tartrate two hours prior to her cardiac CT scan.  She is aware to arrive at 11:30 AM.

## 2023-07-23 ENCOUNTER — Ambulatory Visit (HOSPITAL_COMMUNITY)
Admission: RE | Admit: 2023-07-23 | Discharge: 2023-07-23 | Disposition: A | Discharge status: Home / Self Care | Payer: Medicare Other | Source: Ambulatory Visit | Attending: Internal Medicine | Admitting: Internal Medicine

## 2023-07-23 DIAGNOSIS — R072 Precordial pain: Secondary | ICD-10-CM | POA: Insufficient documentation

## 2023-07-23 DIAGNOSIS — R079 Chest pain, unspecified: Secondary | ICD-10-CM | POA: Diagnosis not present

## 2023-07-23 DIAGNOSIS — I2510 Atherosclerotic heart disease of native coronary artery without angina pectoris: Secondary | ICD-10-CM

## 2023-07-23 DIAGNOSIS — R0602 Shortness of breath: Secondary | ICD-10-CM | POA: Insufficient documentation

## 2023-07-23 MED ORDER — METOPROLOL TARTRATE 5 MG/5ML IV SOLN: 10.0000 mg | Freq: Once | INTRAVENOUS | Status: DC | PRN

## 2023-07-23 MED ORDER — DILTIAZEM HCL 25 MG/5ML IV SOLN: 10.0000 mg | INTRAVENOUS | Status: DC | PRN

## 2023-07-23 MED ORDER — IOPAMIDOL (ISOVUE-370) INJECTION 76%
95.0000 mL | Freq: Once | INTRAVENOUS | Status: AC | PRN
  Administered 2023-07-23: 95 mL via INTRAVENOUS

## 2023-07-23 MED ORDER — NITROGLYCERIN 0.4 MG SL SUBL
SUBLINGUAL_TABLET | SUBLINGUAL | Status: AC
  Filled 2023-07-23: qty 2

## 2023-07-23 MED ORDER — NITROGLYCERIN 0.4 MG SL SUBL
0.8000 mg | SUBLINGUAL_TABLET | Freq: Once | SUBLINGUAL | Status: AC
  Administered 2023-07-23: 0.8 mg via SUBLINGUAL

## 2023-07-28 DIAGNOSIS — M48062 Spinal stenosis, lumbar region with neurogenic claudication: Secondary | ICD-10-CM | POA: Diagnosis not present

## 2023-07-29 ENCOUNTER — Other Ambulatory Visit: Payer: Self-pay | Admitting: Orthopedic Surgery

## 2023-07-29 DIAGNOSIS — M5416 Radiculopathy, lumbar region: Secondary | ICD-10-CM

## 2023-07-31 ENCOUNTER — Telehealth: Payer: Self-pay | Admitting: *Deleted

## 2023-07-31 ENCOUNTER — Encounter: Payer: Self-pay | Admitting: *Deleted

## 2023-07-31 DIAGNOSIS — R0609 Other forms of dyspnea: Secondary | ICD-10-CM

## 2023-07-31 DIAGNOSIS — R072 Precordial pain: Secondary | ICD-10-CM

## 2023-07-31 NOTE — Telephone Encounter (Signed)
Patient informed and verbalized understanding of plan. Stress test instructions read to patient and made available for pick up Copy sent to PCP

## 2023-07-31 NOTE — Telephone Encounter (Signed)
-----   Message from Vishnu P Mallipeddi sent at 07/25/2023  1:10 PM EST ----- Coronary calcium score is 225 (83rd percentile for age and sex matched control). Moderate CAD (50 to 60% proximal LAD), distal LAD and LCx are poorly visualized. Overall, poor CT study.  Will need to obtain Lexiscan to definitely rule out any ischemia.

## 2023-08-01 ENCOUNTER — Telehealth: Payer: Self-pay | Admitting: Internal Medicine

## 2023-08-01 NOTE — Telephone Encounter (Signed)
Patient states someone advised her that she could stop by the [place] office to pick up a copy of her CT results. She would like to now if she can go by the [place] office instead. Please advise.

## 2023-08-04 ENCOUNTER — Telehealth: Payer: Self-pay | Admitting: Internal Medicine

## 2023-08-04 NOTE — Telephone Encounter (Signed)
Patient called about her upcoming stress test.  She wants to make sure she doesn't need to walk on a treadmill.  She states she can't because of her leg and back.  Please advise.

## 2023-08-04 NOTE — Telephone Encounter (Signed)
Checking percert on the following patient for testing scheduled at [facility].    LEXISCAN  08/14/2023

## 2023-08-04 NOTE — Telephone Encounter (Signed)
Patient informed and verbalized understanding of plan. Patient is having a nuclear stress text done not a exercise

## 2023-08-12 ENCOUNTER — Encounter: Payer: Self-pay | Admitting: Internal Medicine

## 2023-08-12 ENCOUNTER — Telehealth: Payer: Self-pay | Admitting: Internal Medicine

## 2023-08-12 ENCOUNTER — Ambulatory Visit: Payer: Medicare Other | Admitting: Internal Medicine

## 2023-08-12 VITALS — BP 134/84 | HR 87 | Ht 62.0 in | Wt 179.0 lb

## 2023-08-12 DIAGNOSIS — J849 Interstitial pulmonary disease, unspecified: Secondary | ICD-10-CM

## 2023-08-12 DIAGNOSIS — Z836 Family history of other diseases of the respiratory system: Secondary | ICD-10-CM | POA: Diagnosis not present

## 2023-08-12 DIAGNOSIS — R053 Chronic cough: Secondary | ICD-10-CM | POA: Diagnosis not present

## 2023-08-12 DIAGNOSIS — I169 Hypertensive crisis, unspecified: Secondary | ICD-10-CM

## 2023-08-12 DIAGNOSIS — R0609 Other forms of dyspnea: Secondary | ICD-10-CM | POA: Diagnosis not present

## 2023-08-12 NOTE — Discharge Instructions (Signed)

## 2023-08-12 NOTE — Patient Instructions (Addendum)
ICD-10-CM   1. ILD (interstitial lung disease) (HCC)  J84.9     2. Family history of pulmonary fibrosis  Z83.6     3. DOE (dyspnea on exertion)  R06.09     4. Chronic cough  R05.3        You definitely have interstitial lung disease based on reports at least since April 2022  I think this is what is contributing to his symptoms although other issues such as heart and allergies need to be ruled out  I think your family history of pulmonary fibrosis is playing a role in your developing pulmonary fibrosis but your occupation and other types of pulmonary fibrosis could also be playing a role and we need to investigate that  Plan - Keep up with your cardiac workup - Take ILD questionnaire packet with you home and bring it back -Do blood work for ANA, rheumatoid factor, CCP, double-stranded DNA, CK, aldolase, SSA, SSB, SCL-70, ESR - Do blood work for hypersensitivity pneumonitis panel - Do blood work for [employer] gold and BNP -Do blood work for CBC differential and RAST allergy panel -Do full pulmonary function test -Do overnight pulse oximetry test at home -Based on the results of all of the above might have to consider lung biopsy but not sure at this point in time  -   Follow-up - 4-8 weeks 30-minute visit with Dr. Marchelle Gearing but after completing all of the above  -Consider antifibrotic's at next visit.

## 2023-08-12 NOTE — Progress Notes (Signed)
OV 08/12/2023 -new consult ILD center  Subjective:  Patient ID: Kristin Bush, female , DOB: 12-16-1950 , age 72 y.o. , MRN: [id number] , ADDRESS: [address] [place] [address] PCP Ignatius Specking, MD Patient Care Team: Ignatius Specking, MD as PCP - General (Internal Medicine) Mallipeddi, Orion Modest, MD as PCP - Cardiology (Cardiology)  This Provider for this visit: Treatment Team:  Attending Provider: Chilton Greathouse, MD    08/12/2023 -   Chief Complaint  Patient presents with   Consult    Sob, coughing with clear with mucus, using otc just started yesterday, having some tightness in chest , discuss results  ct scan that she had done on 06/17/23 at [facility]  , learn more about her dx and is this family hx. Discuss treatment         HPI Kristin Bush 72 y.o. -presents with her son Deniece Portela over concerns that she has interstitial lung disease.  She tells me that she has a strong family history of interstitial lung disease.  Her mother died 45 years ago from pulmonary fibrosis.  She is the youngest of 9 siblings and she is the fourth to have interstitial lung disease/pulmonary fibrosis.  The second older sibling brother died in 2005/09/04 from pulmonary fibrosis.  The fifth oldest died in 09-04-2016 from pulmonary fibrosis was a female.  Then the eighth oldest was a female who died in Sep 05, 2011 from pulmonary fibrosis.  She denies any bird exposure in the house.  Denies any mold.  Denies any down so first.  Denies any autoimmune disease.  Denies any Raynaud's.  No history of collagen vascular disease or connective tissue disease.  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  She was exposed to wood dust.  She is also worked in the [employer].  There is no acid reflux or MI or stroke.   In 09/04/2021 she did have a CT abdomen lung images show presence of ILD on report but I could not visualize this image.  But in 09-05-2019 for October she had a CT chest in the [facility] system that is  actually available for my visualization.  I agree with the NSIP report alternate diagnosis.  But she tells me that she is been short of breath for the last 6 months along with cough.  The cough started first.  In spring 2024 she got sick with a respiratory viral infection and it felt like the cough never went away.  Then in August 2020 for the shortness of breath started.  Is present with exertion relieved by rest.  Present for going from the bedroom to the bathroom washing close dishes and doing groceries.  She does not know if it is progressive but the son does feel it is progressive.  Both of them attest that a year ago around Christmas 2023 she was fine.    Simple office walk 224 (66+46 x 2) feet Pod A at [facility] x  3 laps goal with forehead probe 08/12/2023    O2 used ra   Number laps completed 1 only due to staffing   Comments about pace Normal pac   Resting Pulse Ox/HR 97% and 97/min   Final Pulse Ox/HR 92% and 100/min   Desaturated </= 88% no   Desaturated <= 3% points yes   Got Tachycardic >/= 90/min yes   Symptoms at end of test Dyspnea +  Miscellaneous comments x      CT chest findings in ABD CT April 2022 -could not visualize this at all.  FINDINGS:  Lower chest: Diffuse chronic interstitial coarsening. The visualized  lung bases are otherwise clear. There is coronary vascular  calcification.     CT Chest oct 2024 at [facility] = personally visualized and agree with the findings   IMPRESSION:  1. Chronic interstitial lung disease with patchy multifocal  ground-glass attenuation, subpleural and peribronchovascular  reticulation and architectural distortion. Findings are most  consistent with chronic fibrotic interstitial lung disease,  potentially NSIP. Consider follow-up high-resolution chest CT in 3-6  months.  2. No confluent airspace disease or suspicious pulmonary nodule  demonstrated.  3. No definite acute chest findings.  4.  Aortic Atherosclerosis (ICD10-I70.0).     Electronically Signed    By: Carey Bullocks M.D.    On: 06/25/2023 10:44    CT Chest data from date: 07/23/23 coronary CT  - personally visualized and independently interpreted : Yes - my findings are: NSIP groundglass opacities alternate diagnosis.  ADDENDUM REPORT: 08/09/2023 18:33   EXAM: OVER-READ INTERPRETATION  CT CHEST   The following report is an over-read performed by radiologist Dr. Curly Shores [facility] on 08/09/2023. This over-read does not include interpretation of cardiac or coronary anatomy or pathology. The coronary CTA interpretation by the cardiologist is attached.   COMPARISON:  06/17/2023.   FINDINGS: Cardiovascular:  See findings discussed in the body of the report.   Mediastinum/Nodes: No suspicious adenopathy identified. Imaged mediastinal structures are unremarkable.   Lungs/Pleura: Extensive interstitial prominence diffusely consistent with pulmonary edema versus atypical infection or interstitial lung disease. No alveolar consolidation. No pleural effusion or pneumothorax.   Upper Abdomen: No acute abnormality.   Musculoskeletal: No chest wall abnormality. No acute osseous findings. There are thoracic degenerative changes.   IMPRESSION: Extensive interstitial prominence consistent with pulmonary edema versus atypical infection or interstitial lung disease.     Electronically Signed   By: Layla Maw M.D.   On: 08/09/2023 18:33 PFT      No data to display            LAB RESULTS last 96 hours No results found.  LAB RESULTS last 90 days No results found for this or any previous visit (from the past 2160 hour(s)).       has a past medical history of Arthritis, Depression, Dyspnea, Hypertension, SI (sacroiliac) joint dysfunction, Spondylolisthesis, Wears glasses, and Wears partial dentures.   reports that she has never smoked. She has never used smokeless tobacco.  Past Surgical History:   Procedure Laterality Date   ABDOMINAL HYSTERECTOMY     BACK SURGERY  05/28/2018   PLIF   CATARACT EXTRACTION W/PHACO Left 10/06/2014   Procedure: CATARACT EXTRACTION PHACO AND INTRAOCULAR LENS PLACEMENT LEFT EYE;  Surgeon: Gemma Payor, MD;  Location: [facility];  Service: Ophthalmology;  Laterality: Left;  CDE:5.49   CATARACT EXTRACTION W/PHACO Right 10/17/2014   Procedure: CATARACT EXTRACTION PHACO AND INTRAOCULAR LENS PLACEMENT RIGHT EYE CDE=9.81;  Surgeon: Gemma Payor, MD;  Location: [facility];  Service: Ophthalmology;  Laterality: Right;   MULTIPLE TOOTH EXTRACTIONS     ROTATOR CUFF REPAIR     right shoulder   SACROILIAC JOINT FUSION Right 11/18/2019   Procedure: RIGHT SACROILIAC JOINT FUSION;  Surgeon: Estill Bamberg, MD;  Location: [facility];  Service: Orthopedics;  Laterality: Right;   SHOULDER ARTHROSCOPY WITH SUBACROMIAL DECOMPRESSION, ROTATOR CUFF REPAIR AND BICEP TENDON REPAIR Left 09/17/2018  Procedure: LEFT SHOULDER ARTHROSCOPY WITH DEBRIDEMENT, SUBACROMIAL DECOMPRESSION, ROTATOR CUFF;  Surgeon: Bjorn Pippin, MD;  Location: [facility];  Service: Orthopedics;  Laterality: Left;   TRANSFORAMINAL LUMBAR INTERBODY FUSION (TLIF) WITH PEDICLE SCREW FIXATION 1 LEVEL Right 08/09/2021   Procedure: RIGHT-SIDED LUMBAR 3 - LUMBAR 4 TRANSFORAMINAL LUMBAR INTERBODY FUSION WITH INSTRUMENTATION AND ALLOGRAFT;  Surgeon: Estill Bamberg, MD;  Location: [facility];  Service: Orthopedics;  Laterality: Right;   TUBAL LIGATION      No Known Allergies   There is no immunization history on file for this patient.  Family History  Problem Relation Age of Onset   Pulmonary fibrosis Mother    Aneurysm Father    Diabetes Sister    Hypertension Sister    Heart attack Brother      Current Outpatient Medications:    busPIRone (BUSPAR) 5 MG tablet, Take 5 mg by mouth 2 (two) times daily., Disp: , Rfl:    gabapentin (NEURONTIN) 300 MG capsule, Take 300 mg by mouth 2 (two) times daily., Disp: , Rfl:     meloxicam (MOBIC) 7.5 MG tablet, Take 7.5 mg by mouth daily., Disp: , Rfl:    traMADol (ULTRAM) 50 MG tablet, Take 50 mg by mouth 2 (two) times daily as needed., Disp: , Rfl:    triamterene-hydrochlorothiazide (DYAZIDE) 37.5-25 MG capsule, Take 1 tablet by mouth daily. , Disp: , Rfl:    Homeopathic Products (PROSACEA) GEL, Apply 1 application topically daily as needed (rosacea)., Disp: , Rfl:    metoprolol tartrate (LOPRESSOR) 100 MG tablet, Take 1 tablet (100 mg total) by mouth once for 1 dose. 2 hours prior to procedure (Patient not taking: Reported on 08/12/2023), Disp: 1 tablet, Rfl: 0      Objective:   Vitals:   08/12/23 1326  BP: 134/84  Pulse: 87  SpO2: 97%  Weight: 179 lb (81.2 kg)  Height: 5\' 2"  (1.575 m)    Estimated body mass index is 32.74 kg/m as calculated from the following:   Height as of this encounter: 5\' 2"  (1.575 m).   Weight as of this encounter: 179 lb (81.2 kg).  @WEIGHTCHANGE @  [employer]   08/12/23 1326  Weight: 179 lb (81.2 kg)     Physical Exam   General: No distress. obese O2 at rest: no Cane present: no Sitting in wheel chair: no Frail: no Obese: YES Neuro: Alert and Oriented x 3. GCS 15. Speech normal Psych: Pleasant Resp:  Barrel Chest - no.  Wheeze - no, Crackles - yes, No overt respiratory distress CVS: Normal heart sounds. Murmurs - no Ext: Stigmata of Connective Tissue Disease - no HEENT: Normal upper airway. PEERL +. No post nasal drip        Assessment:       ICD-10-CM   1. ILD (interstitial lung disease) (HCC)  J84.9 Sed Rate (ESR)    ANA+ENA+DNA/DS+Scl 70+SjoSSA/B    B Nat Peptide    Pulmonary function test    CBC w/Diff    QuantiFERON-TB Gold Plus    QuantiFERON-TB Gold Plus    CBC w/Diff    B Nat Peptide    ANA+ENA+DNA/DS+Scl 70+SjoSSA/B    Sed Rate (ESR)    CANCELED: QuantiFERON-TB Gold Plus    2. Family history of pulmonary fibrosis  Z83.6     3. DOE (dyspnea on exertion)  R06.09 Pulmonary function  test    Pulse oximetry, overnight    CANCELED: Pulse oximetry, overnight    4. Chronic cough  R05.3 Pulmonary function test    5. Hypertensive crisis, unspecified  I16.9 B Nat Peptide    B Nat Peptide     High concern for progressive phenotype NSIP.  Will out evaluate quickly.  She might need biopsy to see if immunomodulators might work for her.  This allowed to be decided after the below workup.  Regardless of biopsy decision I think at next visit we will have to commit to antifibrotic's.  Particularly nintedanib    Plan:     Patient Instructions     ICD-10-CM   1. ILD (interstitial lung disease) (HCC)  J84.9     2. Family history of pulmonary fibrosis  Z83.6     3. DOE (dyspnea on exertion)  R06.09     4. Chronic cough  R05.3        You definitely have interstitial lung disease based on reports at least since April 2022  I think this is what is contributing to his symptoms although other issues such as heart and allergies need to be ruled out  I think your family history of pulmonary fibrosis is playing a role in your developing pulmonary fibrosis but your occupation and other types of pulmonary fibrosis could also be playing a role and we need to investigate that  Plan - Keep up with your cardiac workup - Take ILD questionnaire packet with you home and bring it back -Do blood work for ANA, rheumatoid factor, CCP, double-stranded DNA, CK, aldolase, SSA, SSB, SCL-70, ESR - Do blood work for hypersensitivity pneumonitis panel - Do blood work for [employer] gold and BNP -Do blood work for CBC differential and RAST allergy panel -Do full pulmonary function test -Do overnight pulse oximetry test at home -Based on the results of all of the above might have to consider lung biopsy but not sure at this point in time  -   Follow-up - 4-8 weeks 30-minute visit with Dr. Marchelle Gearing but after completing all of the above   FOLLOWUP Return in about 6 weeks (around 09/23/2023)  for 30 min visit, ILD, with Dr Marchelle Gearing, Face to Face OR Video Visit.    SIGNATURE    Dr. Kalman Shan, M.D., F.C.C.P,  Pulmonary and Critical Care Medicine Staff Physician, [facility] Center Director - Interstitial Lung Disease  Program  Pulmonary Fibrosis [facility] at [facility] [place], [place], [address]  Pager: [phone], If no answer or between  15:00h - 7:00h: call [phone] Telephone: [phone]  5:52 PM 08/12/2023

## 2023-08-13 ENCOUNTER — Ambulatory Visit
Admission: RE | Admit: 2023-08-13 | Discharge: 2023-08-13 | Disposition: A | Discharge status: Home / Self Care | Payer: Medicare Other | Source: Ambulatory Visit | Attending: Orthopedic Surgery | Admitting: Orthopedic Surgery

## 2023-08-13 DIAGNOSIS — M4727 Other spondylosis with radiculopathy, lumbosacral region: Secondary | ICD-10-CM | POA: Diagnosis not present

## 2023-08-13 DIAGNOSIS — M5416 Radiculopathy, lumbar region: Secondary | ICD-10-CM

## 2023-08-13 MED ORDER — IOPAMIDOL (ISOVUE-M 200) INJECTION 41%
1.0000 mL | Freq: Once | INTRAMUSCULAR | Status: AC
Start: 2023-08-13 — End: 2023-08-13
  Administered 2023-08-13: 1 mL via EPIDURAL

## 2023-08-13 MED ORDER — METHYLPREDNISOLONE ACETATE 40 MG/ML INJ SUSP (RADIOLOG
80.0000 mg | Freq: Once | INTRAMUSCULAR | Status: AC
  Administered 2023-08-13: 80 mg via EPIDURAL

## 2023-08-14 ENCOUNTER — Encounter (HOSPITAL_COMMUNITY): Payer: Self-pay

## 2023-08-14 ENCOUNTER — Ambulatory Visit (HOSPITAL_BASED_OUTPATIENT_CLINIC_OR_DEPARTMENT_OTHER)
Admission: RE | Admit: 2023-08-14 | Discharge: 2023-08-14 | Disposition: A | Discharge status: Home / Self Care | Payer: Medicare Other | Source: Ambulatory Visit | Attending: Internal Medicine | Admitting: Internal Medicine

## 2023-08-14 ENCOUNTER — Ambulatory Visit (HOSPITAL_COMMUNITY)
Admission: RE | Admit: 2023-08-14 | Discharge: 2023-08-14 | Disposition: A | Discharge status: Home / Self Care | Payer: Medicare Other | Source: Ambulatory Visit | Attending: Internal Medicine | Admitting: Internal Medicine

## 2023-08-14 ENCOUNTER — Telehealth: Payer: Self-pay | Admitting: Internal Medicine

## 2023-08-14 DIAGNOSIS — R0609 Other forms of dyspnea: Secondary | ICD-10-CM | POA: Insufficient documentation

## 2023-08-14 DIAGNOSIS — R072 Precordial pain: Secondary | ICD-10-CM | POA: Insufficient documentation

## 2023-08-14 DIAGNOSIS — Z836 Family history of other diseases of the respiratory system: Secondary | ICD-10-CM

## 2023-08-14 LAB — CBC WITH DIFFERENTIAL/PLATELET
Basophils Absolute: 0.1 10*3/uL (ref 0.0–0.2)
Basos: 1 %
EOS (ABSOLUTE): 0.3 10*3/uL (ref 0.0–0.4)
Eos: 4 %
Hematocrit: 44.4 % (ref 34.0–46.6)
Hemoglobin: 14.1 g/dL (ref 11.1–15.9)
Immature Grans (Abs): 0 10*3/uL (ref 0.0–0.1)
Immature Granulocytes: 0 %
Lymphocytes Absolute: 1 10*3/uL (ref 0.7–3.1)
Lymphs: 14 %
MCH: 27.5 pg (ref 26.6–33.0)
MCHC: 31.8 g/dL (ref 31.5–35.7)
MCV: 87 fL (ref 79–97)
Monocytes Absolute: 0.5 10*3/uL (ref 0.1–0.9)
Monocytes: 7 %
Neutrophils Absolute: 5.6 10*3/uL (ref 1.4–7.0)
Neutrophils: 74 %
Platelets: 363 10*3/uL (ref 150–450)
RBC: 5.13 x10E6/uL (ref 3.77–5.28)
RDW: 12.9 % (ref 11.7–15.4)
WBC: 7.4 10*3/uL (ref 3.4–10.8)

## 2023-08-14 LAB — FANA STAINING PATTERNS: Speckled Pattern: 1:80 {titer}

## 2023-08-14 LAB — NM MYOCAR MULTI W/SPECT W/WALL MOTION / EF
LV dias vol: 35 mL (ref 46–106)
LV sys vol: 3 mL
Nuc Stress EF: 91 %
Peak HR: 115 {beats}/min
RATE: 0.5
Rest HR: 93 {beats}/min
Rest Nuclear Isotope Dose: 10.5 mCi
SDS: 2
SRS: 3
SSS: 5
ST Depression (mm): 0 mm
Stress Nuclear Isotope Dose: 30.2 mCi
TID: 1.21

## 2023-08-14 LAB — ANA+ENA+DNA/DS+SCL 70+SJOSSA/B
ANA Titer 1: POSITIVE — AB
ENA RNP Ab: <0.2 AI (ref 0.0–0.9)
ENA SM Ab Ser-aCnc: <0.2 AI (ref 0.0–0.9)
ENA SSA (RO) Ab: <0.2 AI (ref 0.0–0.9)
ENA SSB (LA) Ab: <0.2 AI (ref 0.0–0.9)
Scleroderma (Scl-70) (ENA) Antibody, IgG: <0.2 AI (ref 0.0–0.9)
dsDNA Ab: <1 [IU]/mL (ref 0–9)

## 2023-08-14 LAB — QUANTIFERON-TB GOLD PLUS
Mitogen-NIL: >10 [IU]/mL
NIL: 0.02 [IU]/mL
QuantiFERON-TB Gold Plus: NEGATIVE
TB1-NIL: 0 [IU]/mL
TB2-NIL: 0 [IU]/mL

## 2023-08-14 LAB — BRAIN NATRIURETIC PEPTIDE: BNP: 15.7 pg/mL (ref 0.0–100.0)

## 2023-08-14 LAB — SEDIMENTATION RATE: Sed Rate: 54 mm/h — ABNORMAL HIGH (ref 0–40)

## 2023-08-14 MED ORDER — SODIUM CHLORIDE FLUSH 0.9 % IV SOLN
INTRAVENOUS | Status: AC
  Administered 2023-08-14: 10 mL via INTRAVENOUS
  Filled 2023-08-14: qty 10

## 2023-08-14 MED ORDER — REGADENOSON 0.4 MG/5ML IV SOLN
INTRAVENOUS | Status: AC
  Administered 2023-08-14: 0.4 mg via INTRAVENOUS
  Filled 2023-08-14: qty 5

## 2023-08-14 MED ORDER — TECHNETIUM TC 99M TETROFOSMIN IV KIT
30.2000 | PACK | Freq: Once | INTRAVENOUS | Status: AC | PRN
  Administered 2023-08-14: 30.2 via INTRAVENOUS

## 2023-08-14 MED ORDER — TECHNETIUM TC 99M TETROFOSMIN IV KIT
10.0000 | PACK | Freq: Once | INTRAVENOUS | Status: AC | PRN
  Administered 2023-08-14: 10 via INTRAVENOUS

## 2023-08-14 MED ORDER — TECHNETIUM TC 99M TETROFOSMIN IV KIT
30.0000 | PACK | Freq: Once | INTRAVENOUS | Status: DC | PRN
Start: 2023-08-14 — End: 2023-08-20

## 2023-08-14 NOTE — Telephone Encounter (Signed)
Forgot to refer to genetric. Order place.let her know

## 2023-08-20 ENCOUNTER — Telehealth: Payer: Self-pay | Admitting: Genetic Counselor

## 2023-08-23 DIAGNOSIS — Z1212 Encounter for screening for malignant neoplasm of rectum: Secondary | ICD-10-CM | POA: Diagnosis not present

## 2023-08-23 DIAGNOSIS — Z1211 Encounter for screening for malignant neoplasm of colon: Secondary | ICD-10-CM | POA: Diagnosis not present

## 2023-08-28 DIAGNOSIS — R0609 Other forms of dyspnea: Secondary | ICD-10-CM | POA: Diagnosis not present

## 2023-08-29 ENCOUNTER — Encounter: Payer: Self-pay | Admitting: Internal Medicine

## 2023-09-04 DIAGNOSIS — R52 Pain, unspecified: Secondary | ICD-10-CM | POA: Diagnosis not present

## 2023-09-04 DIAGNOSIS — J329 Chronic sinusitis, unspecified: Secondary | ICD-10-CM | POA: Diagnosis not present

## 2023-09-04 DIAGNOSIS — Z299 Encounter for prophylactic measures, unspecified: Secondary | ICD-10-CM | POA: Diagnosis not present

## 2023-09-04 DIAGNOSIS — R0981 Nasal congestion: Secondary | ICD-10-CM | POA: Diagnosis not present

## 2023-09-30 ENCOUNTER — Telehealth: Payer: Self-pay | Admitting: Internal Medicine

## 2023-09-30 NOTE — Telephone Encounter (Signed)
Kristin Bush -overnight pulse oximetry done on 08/26/2023 shows desaturations for 32 minutes and 24 seconds  Plan - This is very borderline and I am not so sure she would benefit from oxygen at night but we can have this conversation and she visits because more than 30 days have elapsed and my apologies

## 2023-10-01 ENCOUNTER — Telehealth: Payer: Self-pay | Admitting: Genetic Counselor

## 2023-10-01 NOTE — Telephone Encounter (Signed)
Called patient to reschedule genetics appt so that she can be seen with Clydie Braun.  Appt scheduled 1/30 at 11am.

## 2023-10-01 NOTE — Telephone Encounter (Signed)
Results have been relayed to the patient/authorized caretaker. The patient/authorized caretaker verbalized understanding. No questions at this time.   

## 2023-10-02 ENCOUNTER — Inpatient Hospital Stay: Payer: Medicare Other

## 2023-10-02 ENCOUNTER — Inpatient Hospital Stay: Payer: Medicare Other | Admitting: Genetic Counselor

## 2023-10-02 ENCOUNTER — Institutional Professional Consult (permissible substitution): Payer: Medicare Other | Admitting: Internal Medicine

## 2023-10-07 ENCOUNTER — Encounter: Payer: Self-pay | Admitting: Internal Medicine

## 2023-10-09 ENCOUNTER — Inpatient Hospital Stay: Payer: Medicare Other | Attending: Genetic Counselor | Admitting: Genetic Counselor

## 2023-10-09 ENCOUNTER — Encounter: Payer: Self-pay | Admitting: Internal Medicine

## 2023-10-09 ENCOUNTER — Ambulatory Visit (HOSPITAL_BASED_OUTPATIENT_CLINIC_OR_DEPARTMENT_OTHER): Payer: Medicare Other | Admitting: Internal Medicine

## 2023-10-09 ENCOUNTER — Encounter: Payer: Self-pay | Admitting: Genetic Counselor

## 2023-10-09 ENCOUNTER — Inpatient Hospital Stay: Payer: Medicare Other

## 2023-10-09 ENCOUNTER — Other Ambulatory Visit: Payer: Self-pay | Admitting: Genetic Counselor

## 2023-10-09 ENCOUNTER — Ambulatory Visit (INDEPENDENT_AMBULATORY_CARE_PROVIDER_SITE_OTHER): Payer: Medicare Other | Admitting: Internal Medicine

## 2023-10-09 VITALS — BP 130/85 | HR 97 | Ht 62.0 in | Wt 175.6 lb

## 2023-10-09 DIAGNOSIS — R053 Chronic cough: Secondary | ICD-10-CM

## 2023-10-09 DIAGNOSIS — Z800 Family history of malignant neoplasm of digestive organs: Secondary | ICD-10-CM | POA: Diagnosis not present

## 2023-10-09 DIAGNOSIS — J849 Interstitial pulmonary disease, unspecified: Secondary | ICD-10-CM | POA: Diagnosis not present

## 2023-10-09 DIAGNOSIS — Z1379 Encounter for other screening for genetic and chromosomal anomalies: Secondary | ICD-10-CM

## 2023-10-09 DIAGNOSIS — Z836 Family history of other diseases of the respiratory system: Secondary | ICD-10-CM

## 2023-10-09 DIAGNOSIS — R0609 Other forms of dyspnea: Secondary | ICD-10-CM

## 2023-10-09 LAB — PULMONARY FUNCTION TEST
DL/VA % pred: 113 %
DL/VA: 4.82 ml/min/mmHg/L
DLCO cor % pred: 66 %
DLCO cor: 11.4 ml/min/mmHg
DLCO unc % pred: 68 %
DLCO unc: 11.63 ml/min/mmHg
FEF 25-75 Post: 1.09 L/s
FEF 25-75 Pre: 0.67 L/s
FEF2575-%Change-Post: 61 %
FEF2575-%Pred-Post: 64 %
FEF2575-%Pred-Pre: 39 %
FEV1-%Change-Post: 14 %
FEV1-%Pred-Post: 56 %
FEV1-%Pred-Pre: 48 %
FEV1-Post: 1.08 L
FEV1-Pre: 0.94 L
FEV1FVC-%Change-Post: 12 %
FEV1FVC-%Pred-Pre: 89 %
FEV6-%Change-Post: 0 %
FEV6-%Pred-Post: 57 %
FEV6-%Pred-Pre: 56 %
FEV6-Post: 1.39 L
FEV6-Pre: 1.38 L
FEV6FVC-%Pred-Post: 104 %
FEV6FVC-%Pred-Pre: 104 %
FVC-%Change-Post: 1 %
FVC-%Pred-Post: 55 %
FVC-%Pred-Pre: 54 %
FVC-Post: 1.4 L
FVC-Pre: 1.38 L
Post FEV1/FVC ratio: 77 %
Post FEV6/FVC ratio: 100 %
Pre FEV1/FVC ratio: 68 %
Pre FEV6/FVC Ratio: 100 %
RV % pred: 61 %
RV: 1.24 L
TLC % pred: 56 %
TLC: 2.53 L

## 2023-10-09 LAB — GENETIC SCREENING ORDER

## 2023-10-09 NOTE — Patient Instructions (Addendum)
ICD-10-CM   1. ILD (interstitial lung disease) (HCC)  J84.9     2. Family history of pulmonary fibrosis  Z83.6       You definitely at least have moderate severity interstitial lung disease.  I think a family history along with a workplace exposures have contributed to this.  This looks like it could be a variety called NSIP or chronic hypersensitive pneumonitis.  Appreciate your fear of having any anesthesia procedures.   Plan - Start nintedanib per protocol -  -Check liver function test today -Repeat overnight pulse oximetry test.  -Was borderline low last time -Do high-resolution CT chest supine and prone in 6 weeks -We will discuss in interstitial lung disease case conference after a high-resolution CT chest. -Await results of genetic testing for interstitial lung disease.  Follow-up - 6 weeks 30-minute visit with Dr. Marchelle Gearing but after high-resolution CT chest  -Based on the CT chest results and case conference we might have to decide whether additional therapies would be needed and what specific type of pulmonary fibrosis you have keeping in mind that you are risk reluctant to having a lung biopsy.

## 2023-10-09 NOTE — Progress Notes (Signed)
Full PFT Performed Today

## 2023-10-09 NOTE — Progress Notes (Addendum)
 REFERRING PROVIDER: Kalman Shan, MD [address] [place],  [place] [address]  PRIMARY PROVIDER:  Ignatius Specking, MD  PRIMARY REASON FOR VISIT:  1. Family history of pancreatic cancer   2. Family history of pulmonary fibrosis   3. ILD (interstitial lung disease) (HCC)      HISTORY OF PRESENT ILLNESS:   Ms. Glauser, a 73 y.o. female, was seen for a [facility] cancer genetics consultation at the request of Dr. Marchelle Gearing due to a personal and family history of pulmonary fibrosis.  Ms. Zuniga presents to clinic today to discuss the possibility of a hereditary predisposition to pulmonary fibrosis, genetic testing, and to further clarify her future cancer risks, as well as potential cancer risks for family members.   In September 2024, at the age of 44, Ms. Leary was diagnosed with interstitial lung disease.  She reports a history of shortness of breath,and a strong family history of pulmonary fibrosis.     CANCER HISTORY:  Oncology History   No history exists.    Past Medical History:  Diagnosis Date   Arthritis    Depression    Dyspnea    Family history of pancreatic cancer    Hypertension    SI (sacroiliac) joint dysfunction    Spondylolisthesis    Wears glasses    Wears partial dentures     Past Surgical History:  Procedure Laterality Date   ABDOMINAL HYSTERECTOMY     BACK SURGERY  05/28/2018   PLIF   CATARACT EXTRACTION W/PHACO Left 10/06/2014   Procedure: CATARACT EXTRACTION PHACO AND INTRAOCULAR LENS PLACEMENT LEFT EYE;  Surgeon: Gemma Payor, MD;  Location: [facility];  Service: Ophthalmology;  Laterality: Left;  CDE:5.49   CATARACT EXTRACTION W/PHACO Right 10/17/2014   Procedure: CATARACT EXTRACTION PHACO AND INTRAOCULAR LENS PLACEMENT RIGHT EYE CDE=9.81;  Surgeon: Gemma Payor, MD;  Location: [facility];  Service: Ophthalmology;  Laterality: Right;   MULTIPLE TOOTH EXTRACTIONS     ROTATOR CUFF REPAIR     right shoulder   SACROILIAC JOINT FUSION Right 11/18/2019    Procedure: RIGHT SACROILIAC JOINT FUSION;  Surgeon: Estill Bamberg, MD;  Location: [facility];  Service: Orthopedics;  Laterality: Right;   SHOULDER ARTHROSCOPY WITH SUBACROMIAL DECOMPRESSION, ROTATOR CUFF REPAIR AND BICEP TENDON REPAIR Left 09/17/2018   Procedure: LEFT SHOULDER ARTHROSCOPY WITH DEBRIDEMENT, SUBACROMIAL DECOMPRESSION, ROTATOR CUFF;  Surgeon: Bjorn Pippin, MD;  Location: [facility];  Service: Orthopedics;  Laterality: Left;   TRANSFORAMINAL LUMBAR INTERBODY FUSION (TLIF) WITH PEDICLE SCREW FIXATION 1 LEVEL Right 08/09/2021   Procedure: RIGHT-SIDED LUMBAR 3 - LUMBAR 4 TRANSFORAMINAL LUMBAR INTERBODY FUSION WITH INSTRUMENTATION AND ALLOGRAFT;  Surgeon: Estill Bamberg, MD;  Location: [facility];  Service: Orthopedics;  Laterality: Right;   TUBAL LIGATION      Social History   Socioeconomic History   Marital status: Widowed    Spouse name: Not on file   Number of children: Not on file   Years of education: Not on file   Highest education level: Not on file  Occupational History   Not on file  Tobacco Use   Smoking status: Never   Smokeless tobacco: Never  Vaping Use   Vaping status: Never Used  Substance and Sexual Activity   Alcohol use: No   Drug use: No   Sexual activity: Not on file  Other Topics Concern   Not on file  Social History Narrative   Not on file   Social Drivers of Health  Financial Resource Strain: Not on file  Food Insecurity: Not on file  Transportation Needs: Not on file  Physical Activity: Not on file  Stress: Not on file  Social Connections: Not on file     FAMILY HISTORY:  We obtained a detailed, 4-generation family history.  Significant diagnoses are listed below: Family History  Problem Relation Age of Onset   Pulmonary fibrosis Mother 32   Aneurysm Father 24   Diabetes Sister    Hypertension Sister    Pulmonary fibrosis Sister 73   Heart attack Brother        d. 75s   Pulmonary fibrosis Brother 27   Pulmonary  fibrosis Brother    Esophageal cancer Brother    Pancreatic cancer Brother    COPD Maternal Aunt    Stroke Paternal Aunt    Pneumonia Maternal Grandmother    Kidney disease Maternal Grandfather    Stroke Paternal Grandmother    Pulmonary fibrosis Cousin        mat first cousin     The patient has a daughter and two sons who do not have IPF.  She has six brothers and two sisters.  Two brothers and one sister died of pulmonary fibrosis.  A third brother died of pancreatic cancer and a fourth brother died of a stroke.  One niece may have cancer, but it is unknown the type, possibly lymphoma.  Both parents are deceased.  The patient's father died of an aneurysm at 16.  He had a sister who had a stroke.  His mother also died of a stroke.  The patient's mother died of pulmonary fibrosis.  She had many siblings, the patient only knew of 5, but stated that there were many more.  One sister had COPD and one brother had a daughter who had pulmonary fibrosis.  The maternal grandparents died of kidney disease and pneumonia.  Ms. Blumenstein is unaware of previous family history of genetic testing for hereditary cancer risks.   GENETIC COUNSELING ASSESSMENT: Ms. Veneziano is a 73 y.o. female with a personal and family history of pulmonary fibrosis which is somewhat suggestive of a familial pulmonary fibrosis  given seven individuals in the family have been diagnosed with the condition. We, therefore, discussed and recommended the following at today's visit.   DISCUSSION: Pulmonary fibrosis is a group of lung diseases characterized by development of scarring in the lungs.  There are several different subtypes of pulmonary fibrosis, including idiopathic pulmonary fibrosis (IPF), with different causes that are defined based on their appearance on imaging tests like a CT scan or how lung tissue looks under the microscope after a lung biopsy.  The incidence of pulmonary fibrosis in the general population is  approximately 6-16/100,000 people in the [place].    We discussed that, in general, most pulmonary fibrosis, or idiopathic lung disease (ILD) is not inherited in families, but instead is sporadic or familial. Sporadic ILD occurs by chance and typically happens at older ages as this type of ILD is caused by a combination of factors that could include genetic factors and exposures. Some families have more ILD than would be expected by chance. This type of familial ILD is thought to be due to a combination of multiple genetic, environmental, and lifestyle factors. While this combination of factors likely increases the risk of ILD, the exact source of this risk is not currently identifiable or testable.   Familial Pulmonary Fibrosis (FPF) is defined by having two or more first degree relatives (parent,  sibling or child) in a family with a diagnosis of pulmonary fibrosis.  In FPF, there is evidence that an underlying inherited genetic component may contribute to the development of pulmonary fibrosis, but environmental factors may also contribute. At present, several genes are known to be associated with FPF and account for approximately 20-30% of FPF cases.  Thus, additional genes remain to be discovered.     One condition associated with hereditary pulmonary fibrosis is a condition called Dyskeratosis Congenita (DC). DC is characterized by physical findings such as lacy pigmentation of the upper torso/chest/neck, dysplastic nails and oral leukoplakia.  Frequently, individuals may have low blood counts, early gray hair or liver disease, although some individuals may not have any physical features.  DC is considered a hereditary cancer syndrome in that it can increase the risk for bone marrow failure, myelodysplastic syndrome (MDS) and acute myeloid leukemia (AML).  It is also associated with short telomeres, and is considered a telomeropathy.  It is estimated that Approximately 20% of patients with FPF have a change (or  mutation) in one of their telomerase genes, including CTC1, DKC1, NHP2, NOP10, TERT, TERC, TINF2, and WRAP53.  Other genes associated with telomeropathies include: NAF1, PARN, RTEL1, and ZCCHC8.  Rarely, FPF is due to mutations in other genes such as SFTPC.  The inheritance pattern in FPF is unclear and may vary from family to family, but autosomal dominance with reduced penetrance appears most likely.  This means that it only takes one copy of the genetic factor inherited from one parent to have risk for the condition and there is a 50/50 chance of inheriting that genetic factor.  However, reduced penetrance means that not all individuals who inherit the genetic factor will develop symptoms or the disease.  Sometimes individuals have pulmonary fibrosis due to short telomeres.  Short telomere syndrome (STS) can be caused by a hereditary mutation in one of the above genes, but it can also be caused by other genetic factors that are currently unknown.  Testing the telomere length is available to see whether telomeres are short.  We discussed that knowing the length of telomeres does not change the diagnosis or treatment of IPF.  It also does not allow [place] to prevent IPF from happening in an unaffected individual.  We also discussed the pancreatic cancer in the family. We discussed that 5 - 10% of cancer is hereditary.  We discussed that pancreatic cancer is one of the cancers we offer genetic testing for in order to determine a hereditary predisposition.  We also discussed that Ms. Woodside's family is quite large and that there are no other cancers that are suspicious of a pancreatic cancer syndrome.  Genetic testing is available to her if she wants to do it.    We reviewed the characteristics, features and inheritance patterns of hereditary syndromes. We also discussed genetic testing, including the appropriate family members to test, the process of testing, insurance coverage and turn-around-time for results. We  discussed the implications of a negative, positive, carrier and/or variant of uncertain significant result. Ms. Lallier  was offered a telomere biology + surfactant gene panel (22 genes).  Ms. Halder decided to pursue genetic testing for the this gene panel.   Based on Ms. Emberson's personal and family history of IPF, she meets medical criteria for genetic testing.  Despite that she meets criteria, she may still have an out of pocket cost. We discussed that if her out of pocket cost for testing is over $100, the  laboratory will call and confirm whether she wants to proceed with testing.  If the out of pocket cost of testing is less than $100 she will be billed by the genetic testing laboratory.   We discussed that some people do not want to undergo genetic testing due to fear of genetic discrimination.  The Genetic Information Nondiscrimination Act (GINA) was signed into federal law in 2008. GINA prohibits health insurers and most employers from discriminating against individuals based on genetic information (including the results of genetic tests and family history information). According to GINA, health insurance companies cannot consider genetic information to be a preexisting condition, nor can they use it to make decisions regarding coverage or rates. GINA also makes it illegal for most employers to use genetic information in making decisions about hiring, firing, promotion, or terms of employment. It is important to note that GINA does not offer protections for life insurance, disability insurance, or long-term care insurance. GINA does not apply to those in the [employer], those who work for companies with less than 15 employees, and new life insurance or long-term disability insurance policies.  Health status due to a cancer diagnosis is not protected under GINA. More information about GINA can be found by visiting [email or web address].  PLAN: After considering the risks, benefits, and limitations, Ms. Lebo  provided informed consent to pursue genetic testing and the blood sample was sent to [facility] for analysis of the Telomere biology disorder and surfactant gene panel. Results should be available within approximately 2-3 weeks' time, at which point they will be disclosed by telephone to Ms. Blumberg, as will any additional recommendations warranted by these results. Ms. Bound will receive a summary of her genetic counseling visit and a copy of her results once available. This information will also be available in Epic.   Lastly, we encouraged Ms. Waldren to remain in contact with cancer genetics annually so that we can continuously update the family history and inform her of any changes in cancer genetics and testing that may be of benefit for this family.   Ms. Gacek's questions were answered to her satisfaction today. Our contact information was provided should additional questions or concerns arise. Thank you for the referral and allowing [place] to share in the care of your patient.   Mariell Nester P. Lowell Guitar, MS, CGC Licensed, Patent attorney [email or web address] phone: [phone]  90 minutes were spent on the date of the encounter in service to the patient including preparation, face-to-face consultation, documentation and care coordination.  The patient brought her daughter in law. Drs. Meliton Rattan, and/or Pryor were available for questions, if needed..    _______________________________________________________________________ For Office Staff:  Number of people involved in session: 2 Was an Intern/ student involved with case: no

## 2023-10-09 NOTE — Progress Notes (Signed)
OV 08/12/2023 -new consult ILD center  Subjective:  Patient ID: Kristin Bush, female , DOB: 18-Nov-1950 , age 73 y.o. , MRN: [id number] , ADDRESS: [address] [place] [address] PCP Ignatius Specking, MD Patient Care Team: Ignatius Specking, MD as PCP - General (Internal Medicine) Mallipeddi, Orion Modest, MD as PCP - Cardiology (Cardiology)  This Provider for this visit: Treatment Team:  Attending Provider: Chilton Greathouse, MD    08/12/2023 -   Chief Complaint  Patient presents with   Consult    Sob, coughing with clear with mucus, using otc just started yesterday, having some tightness in chest , discuss results  ct scan that she had done on 06/17/23 at [facility]  , learn more about her dx and is this family hx. Discuss treatment         HPI Kristin Bush 73 y.o. -presents with her son Deniece Portela over concerns that she has interstitial lung disease.  She tells me that she has a strong family history of interstitial lung disease.  Her mother died 45 years ago from pulmonary fibrosis.  She is the youngest of 9 siblings and she is the fourth to have interstitial lung disease/pulmonary fibrosis.  The second older sibling brother died in 11-03-2004 from pulmonary fibrosis.  The fifth oldest died in November 04, 2015 from pulmonary fibrosis was a female.  Then the eighth oldest was a female who died in 11-03-10 from pulmonary fibrosis.  She denies any bird exposure in the house.  Denies any mold.  Denies any down so first.  Denies any autoimmune disease.  Denies any Raynaud's.  No history of collagen vascular disease or connective tissue disease.  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  She was exposed to wood dust.  She is also worked in the [employer].  There is no acid reflux or MI or stroke.   In 11/03/20 she did have a CT abdomen lung images show presence of ILD on report but I could not visualize this image.  But in November 03, 2018 for October she had a CT chest in the [facility] system that is  actually available for my visualization.  I agree with the NSIP report alternate diagnosis.  But she tells me that she is been short of breath for the last 6 months along with cough.  The cough started first.  In spring 2024 she got sick with a respiratory viral infection and it felt like the cough never went away.  Then in August 2020 for the shortness of breath started.  Is present with exertion relieved by rest.  Present for going from the bedroom to the bathroom washing close dishes and doing groceries.  She does not know if it is progressive but the son does feel it is progressive.  Both of them attest that a year ago around Christmas 2023 she was fine.   CT chest findings in ABD CT April 2022 -could not visualize this at all.  FINDINGS:  Lower chest: Diffuse chronic interstitial coarsening. The visualized  lung bases are otherwise clear. There is coronary vascular  calcification.     CT Chest oct 2024 at [facility] = personally visualized and agree with the findings   IMPRESSION:  1. Chronic interstitial lung disease with patchy multifocal  ground-glass attenuation, subpleural and peribronchovascular  reticulation and architectural distortion. Findings are most  consistent with chronic fibrotic interstitial lung disease,  potentially NSIP. Consider follow-up  high-resolution chest CT in 3-6  months.  2. No confluent airspace disease or suspicious pulmonary nodule  demonstrated.  3. No definite acute chest findings.  4.  Aortic Atherosclerosis (ICD10-I70.0).    Electronically Signed    By: Carey Bullocks M.D.    On: 06/25/2023 10:44    CT Chest data from date: 07/23/23 coronary CT  - personally visualized and independently interpreted : Yes - my findings are: NSIP groundglass opacities alternate diagnosis.  ADDENDUM REPORT: 08/09/2023 18:33   EXAM: OVER-READ INTERPRETATION  CT CHEST   The following report is an over-read performed by radiologist Dr. Curly Shores  [facility] on 08/09/2023. This over-read does not include interpretation of cardiac or coronary anatomy or pathology. The coronary CTA interpretation by the cardiologist is attached.   COMPARISON:  06/17/2023.   FINDINGS: Cardiovascular:  See findings discussed in the body of the report.   Mediastinum/Nodes: No suspicious adenopathy identified. Imaged mediastinal structures are unremarkable.   Lungs/Pleura: Extensive interstitial prominence diffusely consistent with pulmonary edema versus atypical infection or interstitial lung disease. No alveolar consolidation. No pleural effusion or pneumothorax.   Upper Abdomen: No acute abnormality.   Musculoskeletal: No chest wall abnormality. No acute osseous findings. There are thoracic degenerative changes.   IMPRESSION: Extensive interstitial prominence consistent with pulmonary edema versus atypical infection or interstitial lung disease.     Electronically Signed   By: Layla Maw M.D.   On: 08/09/2023 18:33  OV 10/09/2023  Subjective:  Patient ID: Kristin Bush, female , DOB: [birth year] , age 73 y.o. , MRN: [id number] , ADDRESS: [address] [place] [address] PCP Ignatius Specking, MD Patient Care Team: Ignatius Specking, MD as PCP - General (Internal Medicine) Mallipeddi, Orion Modest, MD as PCP - Cardiology (Cardiology)  This Provider for this visit: Treatment Team:  Attending Provider: Kalman Shan, MD    10/09/2023 -   Chief Complaint  Patient presents with   Follow-up    Pt following up with full work up, labs, pft, and ono. Denies any concerns    #ILD workup in progress.  Does not have high-resolution CT scan chest as yet. Interim Health status: No new complaints No new medical problems. No new surgeries. No ER visits. No Urgent care visits. No changes to medications but she did do the ILD questionnaire which we reviewed.  HPI Kristin Bush 73 y.o. -   [facility] Comprehensive ILD  Questionnaire  Symptoms:   Symptoms started suddenly 6 months ago and since then it is progressive.  Cough started in April 2024.  She does cough at night.  She has some clear sputum she does clear her throat.  SYMPTOM SCALE - ILD 10/09/2023  Current weight   O2 use ra  Shortness of Breath 0 -> 5 scale with 5 being worst (score 6 If unable to do)  At rest 3  Simple tasks - showers, clothes change, eating, shaving 3  Household (dishes, doing bed, laundry) 4  Shopping 4  Walking level at own pace 4  Walking up Stairs 4  Total (30-36) Dyspnea Score 21  How bad is your cough? 2  How bad is your fatigue 4  How bad is nausea 0  How bad is vomiting?  0  How bad is diarrhea? 0  How bad is anxiety? 4  How bad is depression 0  Any chronic pain - if so where and how bad 0   Past Medical History :  -Positive for  obesity - Has had COVID disease in September 2021. - Detail connective tissue disease is negative   ROS:  -Positive for fatigue for the last several months arthralgia for several years - Dry eyes for the last few years 5 pound weight loss for the last few weeks  FAMILY HISTORY of LUNG DISEASE:  -Multiple family members with pulmonary fibrosis.  Especially mother, 2 brothers and 1 sister.  Her sister got admitted 1 Easter for ILD.  Apparently it was fairly advanced.  She underwent biopsy in the lung collapse and then by 08/03/2025of that year she passed away.  Therefore patient is petrified of event undergoing general anesthesia and she does not want to do lung biopsy.  She is never even had colonoscopy.  PERSONAL EXPOSURE HISTORY:  -She never smoked.  No marijuana no cocaine no intravenous drug use.  HOME  EXPOSURE and HOBBY DETAILS :  [place] but the home itself is 25 years.  There are some mold and mildew in the bathroom but otherwise detail organic antigen exposure history is negative.  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  She retired in 2019.  Before that for 15 years she worked in [employer].  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  She got exposed to cotton and nylon but she denies cutting nylon.  Before that she worked in [employer] for 30 years where she was exposed to wood dust.  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  She is also done drawl lumbar to finish goods.  During this time she was exposed to industrial dust.  PULMONARY TOXICITY HISTORY (27 items):  Detail o medication history for pulmonary toxicity is negative  INVESTIGATIONS: Pulmonary function test shows at least moderate restriction with reduction in diffusion  No high-resolution CT chest has yet     Simple office walk 224 (66+46 x 2) feet Pod A at [facility] x  3 laps goal with forehead probe 10/09/2023    O2 used ra   Number laps completed Sit stand x 10   Comments about pace Moderate speed   Resting Pulse Ox/HR 95% % and 93/min   Final Pulse Ox/HR 90% and 118/min   Desaturated </= 88% no   Desaturated <= 3% points yes   Got Tachycardic >/= 90/min yes   Symptoms at end of test Dyspneic   Miscellaneous comments x       PFT    Latest Ref Rng & Units 10/09/2023   12:51 PM  PFT Results  FVC-Pre L 1.38  P  FVC-Predicted Pre % 54  P  FVC-Post L 1.40  P  FVC-Predicted Post % 55  P  Pre FEV1/FVC % % 68  P  Post FEV1/FCV % % 77  P  FEV1-Pre L 0.94  P  FEV1-Predicted Pre % 48  P  FEV1-Post L 1.08  P  DLCO uncorrected ml/min/mmHg 11.63  P  DLCO UNC% % 68  P  DLCO corrected ml/min/mmHg 11.40  P  DLCO COR %Predicted % 66  P  DLVA Predicted % 113  P  TLC L 2.53  P  TLC % Predicted % 56  P  RV % Predicted % 61  P    P Preliminary result  LAB RESULTS last 96 hours No results found.  LAB RESULTS last 90 days Recent Results (from the past 2160 hours)   QuantiFERON-TB Gold Plus     Status: None   Collection Time: 08/12/23  2:39 PM  Result Value Ref Range   QuantiFERON-TB Gold Plus NEGATIVE NEGATIVE    Comment: Negative test result. M. tuberculosis complex  infection unlikely.    NIL 0.02 IU/mL   Mitogen-NIL >10.00 IU/mL   TB1-NIL 0.00 IU/mL   TB2-NIL 0.00 IU/mL    Comment: . The Nil tube value reflects the background interferon gamma immune response of the patient's blood sample. This value has been subtracted from the patient's displayed TB and Mitogen results. . Lower than expected results with the Mitogen tube prevent false-negative Quantiferon readings by detecting a patient with a potential immune suppressive condition and/or suboptimal pre-analytical specimen handling. . The TB1 Antigen tube is coated with the M. tuberculosis-specific antigens designed to elicit responses from TB antigen primed CD4+ helper T-lymphocytes. . The TB2 Antigen tube is coated with the M. tuberculosis-specific antigens designed to elicit responses from TB antigen primed CD4+ helper and CD8+ cytotoxic T-lymphocytes. . For additional information, please refer to https://education.[facility].com/faq/FAQ204 (This link is being provided for informational/ educational purposes only.) .   CBC w/Diff     Status: None   Collection Time: 08/12/23  2:39 PM  Result Value Ref Range   WBC 7.4 3.4 - 10.8 x10E3/uL   RBC 5.13 3.77 - 5.28 x10E6/uL   Hemoglobin 14.1 11.1 - 15.9 g/dL   Hematocrit 42.5 95.6 - 46.6 %   MCV 87 79 - 97 fL   MCH 27.5 26.6 - 33.0 pg   MCHC 31.8 31.5 - 35.7 g/dL   RDW 38.7 56.4 - 33.2 %   Platelets 363 150 - 450 x10E3/uL   Neutrophils 74 Not Estab. %   Lymphs 14 Not Estab. %   Monocytes 7 Not Estab. %   Eos 4 Not Estab. %   Basos 1 Not Estab. %   Neutrophils Absolute 5.6 1.4 - 7.0 x10E3/uL   Lymphocytes Absolute 1.0 0.7 - 3.1 x10E3/uL   Monocytes Absolute 0.5 0.1 - 0.9 x10E3/uL   EOS (ABSOLUTE) 0.3 0.0 - 0.4  x10E3/uL   Basophils Absolute 0.1 0.0 - 0.2 x10E3/uL   Immature Granulocytes 0 Not Estab. %   Immature Grans (Abs) 0.0 0.0 - 0.1 x10E3/uL  B Nat Peptide     Status: None   Collection Time: 08/12/23  2:39 PM  Result Value Ref Range   BNP 15.7 0.0 - 100.0 pg/mL    Comment: Siemens ADVIA Centaur XP methodology  ANA+ENA+DNA/DS+Scl 70+SjoSSA/B     Status: Abnormal   Collection Time: 08/12/23  2:39 PM  Result Value Ref Range   ANA Titer 1 Positive (A)     Comment:                                      Negative   <1:80                                      Borderline  1:80  Positive   >1:80    dsDNA Ab <1 0 - 9 IU/mL    Comment:                                    Negative      <5                                    Equivocal  5 - 9                                    Positive      >9    ENA RNP Ab <0.2 0.0 - 0.9 AI   ENA SM Ab Ser-aCnc <0.2 0.0 - 0.9 AI   Scleroderma (Scl-70) (ENA) Antibody, IgG <0.2 0.0 - 0.9 AI   ENA SSA (RO) Ab <0.2 0.0 - 0.9 AI   ENA SSB (LA) Ab <0.2 0.0 - 0.9 AI  Sed Rate (ESR)     Status: Abnormal   Collection Time: 08/12/23  2:39 PM  Result Value Ref Range   Sed Rate 54 (H) 0 - 40 mm/hr  FANA Staining Patterns     Status: None   Collection Time: 08/12/23  2:39 PM  Result Value Ref Range   Speckled Pattern 1:80     Comment: ICAP nomenclature: AC-2,4,5,29   Note: Comment     Comment: Pattern              Potential Disease Association -------------  --------------------------------------------- Homogeneous    Systemic Lupus Erythematosus, Drug Induced                Systemic Lupus Erythematosus, Chronic                Autoimmune hepatitis, Juvenile Idiopathic                Arthritis -------------  --------------------------------------------- Speckled       Sjogren Syndrome, Systemic Lupus                Erythematosus, Subacute Cutaneous Lupus,                Neonatal Lupus, Congenital Heart Block,                Mixed  Connective Tissue Disease,                Scleroderma-diffuse, Scleroderma-Autoimmune                Myositis Overlap Syndrome, Systemic Lupus                Erythematosus-Scleroderma-Autoimmune                Myositis Overlap Syndrome, Systemic                Autoimmune Rheumatic Disease,                Undifferentiated Connective Tissue Disease -------------  --------------------------------------------- Nucleolar      Systemic [place] lerosis, Scleroderma-Autoimmune                Myositis Overlap Syndrome, Sjogren                Syndrome, Raynaud phenomenon, Pulmonary  Arterial Hypertension, Systemic Autoimmune                Rheumatic Disease, Cancer -------------  --------------------------------------------- Centromere     Scleroderma-CREST, Limited Cutaneous SSc,                Raynaud's Phenomenon, Primary Biliary                Cholangitis -------------  --------------------------------------------- Nuclear Dot    Primary Biliary Cholangitis -------------  --------------------------------------------- Nuclear        Primary Biliary Cholangitis, Autoimmune Membrane       Hepatitis/Liver disease, Systemic Autoimmune                Rheumatic Disease, Autoimmune Cytopenias,                Linear Scleroderma, Antiphospholipid Syndrome -------------  ---------------------------------------------   NM Myocar Multi W/Spect W/Wall Motion / EF     Status: None   Collection Time: 08/14/23 12:38 PM  Result Value Ref Range   Rest HR 93.0 bpm   Rest BP 166/87 mmHg   Peak HR 115 bpm   Peak BP 168/77 mmHg   Rest Nuclear Isotope Dose 10.5 mCi   Stress Nuclear Isotope Dose 30.2 mCi   SSS 5.0    SRS 3.0    SDS 2.0    TID 1.21    LV sys vol 3.0 mL   LV dias vol 35.0 46 - 106 mL   RATE 0.5    Nuc Stress EF 91 %   ST Depression (mm) 0 mm  Genetic Screening Order     Status: None   Collection Time: 10/09/23 12:09 PM  Result Value Ref Range   Genetic Screening Order  Collected by Laboratory     Comment: Performed at [facility], [address], [place], [place] [address]  Pulmonary function test     Status: None (Preliminary result)   Collection Time: 10/09/23 12:51 PM  Result Value Ref Range   FVC-Pre 1.38 L   FVC-%Pred-Pre 54 %   FVC-Post 1.40 L   FVC-%Pred-Post 55 %   FVC-%Change-Post 1 %   FEV1-Pre 0.94 L   FEV1-%Pred-Pre 48 %   FEV1-Post 1.08 L   FEV1-%Pred-Post 56 %   FEV1-%Change-Post 14 %   FEV6-Pre 1.38 L   FEV6-%Pred-Pre 56 %   FEV6-Post 1.39 L   FEV6-%Pred-Post 57 %   FEV6-%Change-Post 0 %   Pre FEV1/FVC ratio 68 %   FEV1FVC-%Pred-Pre 89 %   Post FEV1/FVC ratio 77 %   FEV1FVC-%Change-Post 12 %   Pre FEV6/FVC Ratio 100 %   FEV6FVC-%Pred-Pre 104 %   Post FEV6/FVC ratio 100 %   FEV6FVC-%Pred-Post 104 %   FEF 25-75 Pre 0.67 L/sec   FEF2575-%Pred-Pre 39 %   FEF 25-75 Post 1.09 L/sec   FEF2575-%Pred-Post 64 %   FEF2575-%Change-Post 61 %   RV 1.24 L   RV % pred 61 %   TLC 2.53 L   TLC % pred 56 %   DLCO unc 11.63 ml/min/mmHg   DLCO unc % pred 68 %   DLCO cor 11.40 ml/min/mmHg   DLCO cor % pred 66 %   DL/VA 1.91 ml/min/mmHg/L   DL/VA % pred 478 %         has a past medical history of Arthritis, Depression, Dyspnea, Family history of pancreatic cancer, Hypertension, SI (sacroiliac) joint dysfunction, Spondylolisthesis, Wears glasses, and Wears partial dentures.   reports that she  has never smoked. She has never used smokeless tobacco.  Past Surgical History:  Procedure Laterality Date   ABDOMINAL HYSTERECTOMY     BACK SURGERY  05/28/2018   PLIF   CATARACT EXTRACTION W/PHACO Left 10/06/2014   Procedure: CATARACT EXTRACTION PHACO AND INTRAOCULAR LENS PLACEMENT LEFT EYE;  Surgeon: Gemma Payor, MD;  Location: [facility];  Service: Ophthalmology;  Laterality: Left;  CDE:5.49   CATARACT EXTRACTION W/PHACO Right 10/17/2014   Procedure: CATARACT EXTRACTION PHACO AND INTRAOCULAR LENS PLACEMENT RIGHT EYE  CDE=9.81;  Surgeon: Gemma Payor, MD;  Location: [facility];  Service: Ophthalmology;  Laterality: Right;   MULTIPLE TOOTH EXTRACTIONS     ROTATOR CUFF REPAIR     right shoulder   SACROILIAC JOINT FUSION Right 11/18/2019   Procedure: RIGHT SACROILIAC JOINT FUSION;  Surgeon: Estill Bamberg, MD;  Location: [facility];  Service: Orthopedics;  Laterality: Right;   SHOULDER ARTHROSCOPY WITH SUBACROMIAL DECOMPRESSION, ROTATOR CUFF REPAIR AND BICEP TENDON REPAIR Left 09/17/2018   Procedure: LEFT SHOULDER ARTHROSCOPY WITH DEBRIDEMENT, SUBACROMIAL DECOMPRESSION, ROTATOR CUFF;  Surgeon: Bjorn Pippin, MD;  Location: [facility];  Service: Orthopedics;  Laterality: Left;   TRANSFORAMINAL LUMBAR INTERBODY FUSION (TLIF) WITH PEDICLE SCREW FIXATION 1 LEVEL Right 08/09/2021   Procedure: RIGHT-SIDED LUMBAR 3 - LUMBAR 4 TRANSFORAMINAL LUMBAR INTERBODY FUSION WITH INSTRUMENTATION AND ALLOGRAFT;  Surgeon: Estill Bamberg, MD;  Location: [facility];  Service: Orthopedics;  Laterality: Right;   TUBAL LIGATION      No Known Allergies   There is no immunization history on file for this patient.  Family History  Problem Relation Age of Onset   Pulmonary fibrosis Mother 11   Aneurysm Father 18   Diabetes Sister    Hypertension Sister    Pulmonary fibrosis Sister 97   Heart attack Brother        d. 33s   Pulmonary fibrosis Brother 55   Pulmonary fibrosis Brother    Esophageal cancer Brother    Pancreatic cancer Brother    COPD Maternal Aunt    Stroke Paternal Aunt    Pneumonia Maternal Grandmother    Kidney disease Maternal Grandfather    Stroke Paternal Grandmother    Pulmonary fibrosis Cousin        mat first cousin     Current Outpatient Medications:    busPIRone (BUSPAR) 5 MG tablet, Take 5 mg by mouth 2 (two) times daily., Disp: , Rfl:    gabapentin (NEURONTIN) 300 MG capsule, Take 300 mg by mouth 2 (two) times daily., Disp: , Rfl:    Homeopathic Products (PROSACEA) GEL, Apply 1 application  topically daily as needed (rosacea)., Disp: , Rfl:    meloxicam (MOBIC) 7.5 MG tablet, Take 7.5 mg by mouth daily., Disp: , Rfl:    traMADol (ULTRAM) 50 MG tablet, Take 50 mg by mouth 2 (two) times daily as needed., Disp: , Rfl:    triamterene-hydrochlorothiazide (DYAZIDE) 37.5-25 MG capsule, Take 1 tablet by mouth daily. , Disp: , Rfl:    metoprolol tartrate (LOPRESSOR) 100 MG tablet, Take 1 tablet (100 mg total) by mouth once for 1 dose. 2 hours prior to procedure (Patient not taking: Reported on 08/12/2023), Disp: 1 tablet, Rfl: 0      Objective:   Vitals:   10/09/23 1458  BP: 130/85  Pulse: 97  SpO2: 94%  Weight: 175 lb 9.6 oz (79.7 kg)  Height: 5\' 2"  (1.575 m)    Estimated body mass index is 32.12 kg/m as calculated from the following:  Height as of this encounter: 5\' 2"  (1.575 m).   Weight as of this encounter: 175 lb 9.6 oz (79.7 kg).  @WEIGHTCHANGE @  [employer]   10/09/23 1458  Weight: 175 lb 9.6 oz (79.7 kg)     Physical Exam   General: No distress. obbes O2 at rest: no Cane present: no Sitting in wheel chair: no Frail: no Obese: yes Neuro: Alert and Oriented x 3. GCS 15. Speech normal Psych: Pleasant Resp:  Barrel Chest - no.  Wheeze - no, Crackles - YES BASE, No overt respiratory distress CVS: Normal heart sounds. Murmurs - no Ext: Stigmata of Connective Tissue Disease - no HEENT: Normal upper airway. PEERL +. No post nasal drip        Assessment:       ICD-10-CM   1. ILD (interstitial lung disease) (HCC)  J84.9     2. Family history of pulmonary fibrosis  Z83.6       Other 3 different possibilities of UIP given industrial exposures and age and family history but also chronic hypersensitive pneumonitis given some of the exposures at home such as mold and some of the groundglass opacities with seeing or NSIP affecting women in her age group along with family history.  Will start with antifibrotic's but did indicate to her that depending on the  pathology immunomodulators would be indicated this either to improve the condition or that would be necessary because antifibrotic might not be sufficient.  She understood this but she is petrified of lung biopsies.  Therefore we opted to do a high-resolution CT chest soon Francis Dowse has had other CT scans at this in 2024 but at this point we will have to except the radiation risk.  Based on the high-res CT will also discuss in a case conference.  Did offer potentially bronchoscopy with lavage as a add-on solution to improve pretest probably of specific ILD diagnosis but at this point in time she is nervous of that as well.  Discussed with her entire family who is here. Plan:     Patient Instructions     ICD-10-CM   1. ILD (interstitial lung disease) (HCC)  J84.9     2. Family history of pulmonary fibrosis  Z83.6       You definitely at least have moderate severity interstitial lung disease.  I think a family history along with a workplace exposures have contributed to this.  This looks like it could be a variety called NSIP or chronic hypersensitive pneumonitis.  Appreciate your fear of having any anesthesia procedures.   Plan - Start nintedanib per protocol -  -Check liver function test today -Repeat overnight pulse oximetry test.  -Was borderline low last time -Do high-resolution CT chest supine and prone in 6 weeks -We will discuss in interstitial lung disease case conference after a high-resolution CT chest. -Await results of genetic testing for interstitial lung disease.  Follow-up - 6 weeks 30-minute visit with Dr. Marchelle Gearing but after high-resolution CT chest  -Based on the CT chest results and case conference we might have to decide whether additional therapies would be needed and what specific type of pulmonary fibrosis you have keeping in mind that you are risk reluctant to having a lung biopsy.   FOLLOWUP Return in about 6 weeks (around 11/20/2023) for 15 min visit, with Dr  Marchelle Gearing, ILD.  ( Level 05 visit E&M 2024: Estb >= 40 min in  visit type: on-site physical face to visit  in total care time and  counseling or/and coordination of care by this undersigned MD - Dr Kalman Shan. This includes one or more of the following on this same day 10/09/2023: pre-charting, chart review, note writing, documentation discussion of test results, diagnostic or treatment recommendations, prognosis, risks and benefits of management options, instructions, education, compliance or risk-factor reduction. It excludes time spent by the CMA or office staff in the care of the patient. Actual time 40 min)   SIGNATURE    Dr. Kalman Shan, M.D., F.C.C.P,  Pulmonary and Critical Care Medicine Staff Physician, [facility] Director - Interstitial Lung Disease  Program  Pulmonary Fibrosis [facility] at [facility] [place], [place], [address]  Pager: [phone], If no answer or between  15:00h - 7:00h: call [phone] Telephone: [phone]  3:53 PM 10/09/2023

## 2023-10-09 NOTE — Patient Instructions (Signed)
Full PFT Performed Today

## 2023-10-10 LAB — HEPATIC FUNCTION PANEL
ALT: 15 U/L (ref 0–35)
AST: 24 U/L (ref 0–37)
Albumin: 4.5 g/dL (ref 3.5–5.2)
Alkaline Phosphatase: 89 U/L (ref 39–117)
Bilirubin, Direct: 0 mg/dL (ref 0.0–0.3)
Total Bilirubin: 0.4 mg/dL (ref 0.2–1.2)
Total Protein: 8 g/dL (ref 6.0–8.3)

## 2023-10-13 DIAGNOSIS — Z1331 Encounter for screening for depression: Secondary | ICD-10-CM | POA: Diagnosis not present

## 2023-10-13 DIAGNOSIS — Z1339 Encounter for screening examination for other mental health and behavioral disorders: Secondary | ICD-10-CM | POA: Diagnosis not present

## 2023-10-13 DIAGNOSIS — M545 Low back pain, unspecified: Secondary | ICD-10-CM | POA: Diagnosis not present

## 2023-10-13 DIAGNOSIS — Z299 Encounter for prophylactic measures, unspecified: Secondary | ICD-10-CM | POA: Diagnosis not present

## 2023-10-13 DIAGNOSIS — J849 Interstitial pulmonary disease, unspecified: Secondary | ICD-10-CM | POA: Diagnosis not present

## 2023-10-13 DIAGNOSIS — Z7189 Other specified counseling: Secondary | ICD-10-CM | POA: Diagnosis not present

## 2023-10-13 DIAGNOSIS — E7800 Pure hypercholesterolemia, unspecified: Secondary | ICD-10-CM | POA: Diagnosis not present

## 2023-10-13 DIAGNOSIS — Z0000 Encounter for general adult medical examination without abnormal findings: Secondary | ICD-10-CM | POA: Diagnosis not present

## 2023-10-13 DIAGNOSIS — I700 Atherosclerosis of aorta: Secondary | ICD-10-CM | POA: Diagnosis not present

## 2023-10-13 DIAGNOSIS — Z79899 Other long term (current) drug therapy: Secondary | ICD-10-CM | POA: Diagnosis not present

## 2023-10-13 DIAGNOSIS — I10 Essential (primary) hypertension: Secondary | ICD-10-CM | POA: Diagnosis not present

## 2023-10-13 DIAGNOSIS — R5383 Other fatigue: Secondary | ICD-10-CM | POA: Diagnosis not present

## 2023-10-13 DIAGNOSIS — Z6833 Body mass index (BMI) 33.0-33.9, adult: Secondary | ICD-10-CM | POA: Diagnosis not present

## 2023-10-20 ENCOUNTER — Telehealth: Payer: Self-pay | Admitting: Internal Medicine

## 2023-10-20 NOTE — Telephone Encounter (Signed)
 Patient states [employer] patient assistance needs RX for Nintedanib. Fax number is [phone]. Patient phone number is [phone].

## 2023-10-22 NOTE — Telephone Encounter (Signed)
Pharmacy team was not notified of Ofev new start and did not receive any Ofev ne start paperwork  Submitted a Prior Authorization request to [facility] for OFEV via CoverMyMeds. Will update once we receive a response.  Key: [id number]   Prescirber form of [employer] PAP for Ofev placed in Dr. Jane Canary mailbox for signature

## 2023-10-23 NOTE — Telephone Encounter (Signed)
PT calling about OFEV. States she is calling [employer] and they don't have anything. I read to her Ms. Devki's reply below but she disputed the process and thinks we need to send something to [facility].  PT # is [phone]

## 2023-10-24 NOTE — Telephone Encounter (Signed)
We are waiting for Dr. Jane Canary portion of [facility] application as this is the Ofev rx form.  Received notification from [facility] regarding a prior authorization for OFEV. Authorization has been APPROVED from 10/08/2023 until further notice. Approval letter sent to scan center.  Authorization # [id number]  Called patient to advise that we awaiting prescriber form to be returned  Chesley Mires, PharmD, MPH, BCPS, CPP Clinical Pharmacist (Rheumatology and Pulmonology)

## 2023-10-27 ENCOUNTER — Other Ambulatory Visit: Payer: Self-pay | Admitting: Orthopedic Surgery

## 2023-10-27 DIAGNOSIS — M5416 Radiculopathy, lumbar region: Secondary | ICD-10-CM

## 2023-10-29 NOTE — Telephone Encounter (Signed)
 PT calling again because [employer] has not rec'd ppwk. Please call PT to advise. Her # is [phone]

## 2023-10-31 ENCOUNTER — Encounter: Payer: Self-pay | Admitting: Genetic Counselor

## 2023-10-31 DIAGNOSIS — Z1379 Encounter for other screening for genetic and chromosomal anomalies: Secondary | ICD-10-CM | POA: Insufficient documentation

## 2023-11-03 ENCOUNTER — Telehealth: Payer: Self-pay | Admitting: Genetic Counselor

## 2023-11-03 NOTE — Telephone Encounter (Signed)
 Revealed positive genetic test results.  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  Penetrance of the gene is variable.  Family members can be tested.

## 2023-11-03 NOTE — Discharge Instructions (Signed)

## 2023-11-04 ENCOUNTER — Ambulatory Visit
Admission: RE | Admit: 2023-11-04 | Discharge: 2023-11-04 | Disposition: A | Discharge status: Home / Self Care | Payer: Medicare Other | Source: Ambulatory Visit | Attending: Orthopedic Surgery | Admitting: Orthopedic Surgery

## 2023-11-04 DIAGNOSIS — M4727 Other spondylosis with radiculopathy, lumbosacral region: Secondary | ICD-10-CM | POA: Diagnosis not present

## 2023-11-04 DIAGNOSIS — M5416 Radiculopathy, lumbar region: Secondary | ICD-10-CM

## 2023-11-04 MED ORDER — IOPAMIDOL (ISOVUE-M 200) INJECTION 41%
1.0000 mL | Freq: Once | INTRAMUSCULAR | Status: AC
  Administered 2023-11-04: 1 mL via EPIDURAL

## 2023-11-04 MED ORDER — METHYLPREDNISOLONE ACETATE 40 MG/ML INJ SUSP (RADIOLOG
80.0000 mg | Freq: Once | INTRAMUSCULAR | Status: AC
  Administered 2023-11-04: 80 mg via EPIDURAL

## 2023-11-05 NOTE — Telephone Encounter (Signed)
 Received a fax from [facility] regarding an approval for OFEV patient assistance from 11/05/23 to 09/08/2024. Approval letter sent to scan center.  Phone #: [phone] Fax #: [phone] Patient ID: [id number]  Called patient and advised of approval. She was already aware.  Patient counseled on purpose, proper use, and potential adverse effects including diarrhea, nausea, vomiting, decreased appetite, weight loss. Stressed the importance of routine lab monitoring. Will monitor LFT's every month for the first 6 months of treatment then every 3 months. Will monitor CBC every 3 months.  Ofev dose will be 150 mg capsule every 12 hours with food. Stressed importance of taking with food to minimize stomach upset.  Chesley Mires, PharmD, MPH, BCPS, CPP Clinical Pharmacist (Rheumatology and Pulmonology)

## 2023-11-05 NOTE — Telephone Encounter (Signed)
 Received signed MD form for Ofev PAP  Submitted Patient Assistance Application to [facility] for OFEV along with provider portion, patient portion (located in media tab), PA, medication list, insurance card copy. Will update patient when we receive a response.  No income documents  Phone #: [phone] Fax #: [phone]

## 2023-11-10 ENCOUNTER — Ambulatory Visit: Payer: Self-pay | Admitting: Genetic Counselor

## 2023-11-10 ENCOUNTER — Ambulatory Visit
Admission: RE | Admit: 2023-11-10 | Discharge: 2023-11-10 | Disposition: A | Discharge status: Home / Self Care | Payer: Medicare Other | Source: Ambulatory Visit | Attending: Internal Medicine | Admitting: Internal Medicine

## 2023-11-10 ENCOUNTER — Telehealth: Payer: Self-pay | Admitting: Genetic Counselor

## 2023-11-10 DIAGNOSIS — I728 Aneurysm of other specified arteries: Secondary | ICD-10-CM | POA: Diagnosis not present

## 2023-11-10 DIAGNOSIS — Z1379 Encounter for other screening for genetic and chromosomal anomalies: Secondary | ICD-10-CM

## 2023-11-10 DIAGNOSIS — Z836 Family history of other diseases of the respiratory system: Secondary | ICD-10-CM

## 2023-11-10 DIAGNOSIS — J849 Interstitial pulmonary disease, unspecified: Secondary | ICD-10-CM | POA: Diagnosis not present

## 2023-11-10 DIAGNOSIS — I700 Atherosclerosis of aorta: Secondary | ICD-10-CM | POA: Diagnosis not present

## 2023-11-10 NOTE — Telephone Encounter (Signed)
 I followed up with Ms. Kristin Bush to see if she wanted to come back to discuss her test results in person.  She declined that, but indicated she told her family members.  I reminded her that her family members could have complementary testing for 150 days from the date of her report for this single variant.  I also reminded her that this is inherited in an autosomal dominant fashion.  She voiced her understanding.

## 2023-11-10 NOTE — Progress Notes (Signed)
 GENETIC TEST RESULTS   Patient Name: Kristin Bush Patient Age: 73 y.o. Encounter Date: 11/10/2023  Referring Provider: Kalman Shan, MD    Kristin Bush was seen in the Cancer Genetics clinic due to a personal and family history of Pulmonary Fibrosis and concern regarding a hereditary predisposition to cancer in the family. Please refer to the prior Genetics clinic note for more information regarding Kristin Bush's medical and family histories and our assessment at the time.   FAMILY HISTORY:  We obtained a detailed, 4-generation family history.  Significant diagnoses are listed below: Family History  Problem Relation Age of Onset   Pulmonary fibrosis Mother 74   Aneurysm Father 83   Diabetes Sister    Hypertension Sister    Pulmonary fibrosis Sister 74   Heart attack Brother        d. 36s   Pulmonary fibrosis Brother 8   Pulmonary fibrosis Brother    Esophageal cancer Brother    Pancreatic cancer Brother    COPD Maternal Aunt    Stroke Paternal Aunt    Pneumonia Maternal Grandmother    Kidney disease Maternal Grandfather    Stroke Paternal Grandmother    Pulmonary fibrosis Cousin        mat first cousin       The patient has a daughter and two sons who do not have IPF.  She has six brothers and two sisters.  Two brothers and one sister died of pulmonary fibrosis.  A third brother died of pancreatic cancer and a fourth brother died of a stroke.  One niece may have cancer, but it is unknown the type, possibly lymphoma.  Both parents are deceased.   The patient's father died of an aneurysm at 81.  He had a sister who had a stroke.  His mother also died of a stroke.   The patient's mother died of pulmonary fibrosis.  She had many siblings, the patient only knew of 5, but stated that there were many more.  One sister had COPD and one brother had a daughter who had pulmonary fibrosis.  The maternal grandparents died of kidney disease and pneumonia.   Kristin Bush is unaware  of previous family history of genetic testing for hereditary cancer risks.  GENETIC TESTING:   Kristin Bush tested positive for a single pathogenic variant in the PARN gene. Specifically, this variant is c.500C>G (p.Ser167*).  The test report has been scanned into EPIC and is located under the Molecular Pathology section of the Results Review tab.  A portion of the result report is included below for reference. Genetic testing reported out on October 31, 2023.     Genetic testing did identify a variant of uncertain significance (VUS) was identified in the USB1 gene called c.194C>A (p.Thr65Lys).  At this time, it is unknown if this variant is associated with increased cancer risk or if this is a normal finding, but most variants such as this get reclassified to being inconsequential. It should not be used to make medical management decisions. With time, we suspect the lab will determine the significance of this variant, if any. If we do learn more about it, we will try to contact Kristin Bush to discuss it further. However, it is important to stay in touch with [place] periodically and keep the address and phone number up to date.  Clinical Information: Pulmonary fibrosis is a group of lung diseases characterized by development of scarring in the lungs.  There are several different subtypes of  pulmonary fibrosis, including idiopathic pulmonary fibrosis (IPF), with different causes that are defined based on their appearance on imaging tests like a CT scan or how lung tissue looks under the microscope after a lung biopsy.  PARN mutations cause IPF through telomere shortening, and is considered a short telomere syndrome.   Telomeres are proteins that stabilize the ends of chromosomes.  As people age, their telomeres shorten.  Individuals with PARN mutations are born with telomeres that are shorter than average.  There is some evidence of genetic anticipation due to telomere shortening.  Genetic anticipation means  that individuals in each generation can become affected with PARN related conditions at earlier ages.  An example is that older generations present with adult-onset pulmonary fibrosis,and subsequent generations present in childhood with bone marrow failure  The expression of pathogenic PARN mutations can range from IPF to Dyskeratosis Congenita (DC).  DC is characterized by abnormal skin pigmentation, nail dystrophy, oral leukoplakia, progressive bone marrow failure, pulmonary fibrosis and increased risk for certain cancers such as squamous cell carcinoma and hematolymphoid neoplasms. Some individuals with a pathogenic PARN variant can develop a severe form of DC called Hoyeraal-Hreidarsson syndrome (HHS).  HHS usually presents early in life with intrauterine growth restriction, cerebellar hypoplasia, immunodeficiency and bone marrow failure in addition to features of DC.  The conditions associated with PARN are:  Pulmonary Fibrosis, Increased risk  Chest x-ray High Resolution Computed Tomography (HRCT)  Pulmonary Function Studies Evaluation by pulmonologist experienced with IPF Dyskeratosis Congenita (depending on inheritance pattern) Evaluation of clinical symptoms of DC Annual cancer screening  Management Recommendations:  Pulmonary Fibrosis:  Medications Oxygen Pulmonary Rehab   Dyskeratosis Congenita:  At least yearly complete blood count (CBC) Monthly self-examination for oral, head, and neck cancer;  Yearly cancer screening by an otolaryngologist and dermatologist;   For women: Yearly GYN examination.  Additional Considerations: Consideration of lung transplant.  This is most successful in patients with primarily lung limitations & few manifestations of other organ failure, as well as those who are younger in age. Patients should avoid environmental exposures that can increase the chance of developing IPF.  This includes, but is not limited to, smoking, metal fumes/dust, wood  dust, stone and sand particulates, certain agricultural, farming and livestock exposures, bird feather and droppings, and aerosolized molds.  Certain medications including chemotherapeutics, antiarrhythmia agents, and nitrofurantoin can also increase the risk for IPF.   This information is based on current understanding of the gene and may change in the future.   Implications for Family Members: Hereditary predisposition to IPF due to pathogenic variants in the [facility] gene has autosomal dominant inheritance, with reduced penetrance. This means that an individual with a pathogenic variant has a 50% chance of passing the condition on to his/her offspring, however, having the mutation does not mean that someone will develop the disease. Identification of a pathogenic variant allows for the recognition of at-risk relatives who can pursue testing for the familial variant.  There is some evidence of genetic anticipation due to telomere shortening in which older generations present with adult-onset pulmonary fibrosis, and subsequent generations present in childhood with bone marrow failure  Hereditary predisposition of DC due to pathogenic variants in the [facility] gene has both autosomal dominant inheritance and autosomal recessive inheritance.  Autosomal recessive inheritance means that having one copy of a PARN mutation is not enough to have DC. But if an individual inherits a pathogenic PARN mutation from each parent they would then have DC. Parents who each  have a single pathogenic PARN variant have a 25% chance of having a child with DC. This mutation is thought to be associated with autosomal recessive DC.  Therefore we would not expect Ms. Jentz to have DC herself, but is a carrier for this condition.  Family members are encouraged to consider genetic testing for this familial pathogenic variant. As we feel that this mutation causes adult-onset IPF, and is not associated with autosomal dominant DC, we would  not expect a childhood presentation of this condition.  Therefore individuals in the family are not recommended to have testing until they reach at least 71 years of age. They may contact our office at [phone] for more information or to schedule an appointment.  Complimentary testing for the familial variant is available for 150 days from the report date.  Family members who live outside of the area are encouraged to find a genetic counselor in their area by visiting: [email or web address].  Resources: Pulmonary Fibrosis Foundation (PFF) is a resource for those with a diagnosis of IPF.  PFF provides information about medical services, advocacy and awareness.  Additionally, PFF provides a platform for collaboration and support; which includes: local support groups, a toll-free helpline, research registry and recruitment, published medical research, brochures, and more.  For more information, visit [email or web address].  We encouraged Ms. Wyre to remain in contact with [place] on an annual basis so we can update her personal and family histories, and let her know of advances in cancer genetics that may benefit the family. Our contact number was provided. Ms. Kalata's questions were answered to her satisfaction today, and she knows she is welcome to call anytime with additional questions.   Cinthia Rodden P. Lowell Guitar, MS, [facility] Licensed, Patent attorney [email or web address] phone: [phone]

## 2023-11-20 ENCOUNTER — Ambulatory Visit (INDEPENDENT_AMBULATORY_CARE_PROVIDER_SITE_OTHER): Payer: Medicare Other | Admitting: Internal Medicine

## 2023-11-20 ENCOUNTER — Encounter: Payer: Self-pay | Admitting: Internal Medicine

## 2023-11-20 VITALS — BP 120/82 | HR 83 | Temp 97.8°F | Ht 62.0 in | Wt 174.4 lb

## 2023-11-20 DIAGNOSIS — Z836 Family history of other diseases of the respiratory system: Secondary | ICD-10-CM

## 2023-11-20 DIAGNOSIS — R682 Dry mouth, unspecified: Secondary | ICD-10-CM | POA: Diagnosis not present

## 2023-11-20 DIAGNOSIS — K5903 Drug induced constipation: Secondary | ICD-10-CM | POA: Diagnosis not present

## 2023-11-20 DIAGNOSIS — J849 Interstitial pulmonary disease, unspecified: Secondary | ICD-10-CM

## 2023-11-20 DIAGNOSIS — H04129 Dry eye syndrome of unspecified lacrimal gland: Secondary | ICD-10-CM | POA: Diagnosis not present

## 2023-11-20 DIAGNOSIS — Z5181 Encounter for therapeutic drug level monitoring: Secondary | ICD-10-CM

## 2023-11-20 LAB — HEPATIC FUNCTION PANEL
ALT: 12 U/L (ref 0–35)
AST: 17 U/L (ref 0–37)
Albumin: 4.5 g/dL (ref 3.5–5.2)
Alkaline Phosphatase: 97 U/L (ref 39–117)
Bilirubin, Direct: 0.1 mg/dL (ref 0.0–0.3)
Total Bilirubin: 0.6 mg/dL (ref 0.2–1.2)
Total Protein: 8.1 g/dL (ref 6.0–8.3)

## 2023-11-20 LAB — SEDIMENTATION RATE: Sed Rate: 60 mm/h — ABNORMAL HIGH (ref 0–30)

## 2023-11-20 NOTE — Patient Instructions (Addendum)
 ICD-10-CM   1. ILD (interstitial lung disease) (HCC)  J84.9     2. Family history of pulmonary fibrosis  Z83.6     3. Dry eye  H04.129     4. Dry mouth  R68.2     5. Encounter for therapeutic drug monitoring  Z51.81     6. Drug induced constipation  K59.03       ILD (interstitial lung disease) (HCC) Family history of pulmonary fibrosis PARM Mutation Dry eye Dry mouth    You definitely at least have moderate severity interstitial lung disease.  I think a family history of PARN Mutation is has  contributed to this.  This looks like it could be a variety called NSIP.  Official CT report is still pending  Dry eye and dry mouth that is unexplained.  Sjogren's ruled out but I do not know if it is related to your mutation of PARN  Overnight pulse oximetry in January 2025 was borderline with 32 minutes of desaturation  Plan - Await official CT report - Check myositis antibody panel -Will discuss her case conference at some point in time -Respect fear of anesthesia for biopsy -Based on course might have to consider anti-inflammatory medication such as CellCept or Imuran -But at this point I just wanted to continue nintedanib and see how this works out for you side effect profile -Do spirometry and DLCO in 8 weeks -Can hold off on night oxygen but if you are really keen we can do 1 L at night    Encounter for therapeutic drug monitoring - OFEV Drug induced constipation   -Paradoxical constipation 1 week after starting nintedanib but managing with stool softener    Plan - Cotninue nintedanib per protocol -  -Check liver function test today   Follow-up - 8 weeks 30-minute visit with Dr. Marchelle Gearing but after spirometry and DLCO

## 2023-11-20 NOTE — Progress Notes (Signed)
 OV 08/12/2023 -new consult ILD center  Subjective:  Patient ID: Kristin Bush, female , DOB: [birth year] , age 73 y.o. , MRN: [id number] , ADDRESS: [address] [place] [address] PCP Ignatius Specking, MD Patient Care Team: Ignatius Specking, MD as PCP - General (Internal Medicine) Mallipeddi, Orion Modest, MD as PCP - Cardiology (Cardiology)  This Provider for this visit: Treatment Team:  Attending Provider: Chilton Greathouse, MD    08/12/2023 -   Chief Complaint  Patient presents with   Consult    Sob, coughing with clear with mucus, using otc just started yesterday, having some tightness in chest , discuss results  ct scan that she had done on 06/17/23 at [facility]  , learn more about her dx and is this family hx. Discuss treatment         HPI Kristin Bush 73 y.o. -presents with her son Deniece Portela over concerns that she has interstitial lung disease.  She tells me that she has a strong family history of interstitial lung disease.  Her mother died 45 years ago from pulmonary fibrosis.  She is the youngest of 9 siblings and she is the fourth to have interstitial lung disease/pulmonary fibrosis.  The second older sibling brother died in December 01, 2004 from pulmonary fibrosis.  The fifth oldest died in 2015/12/02 from pulmonary fibrosis was a female.  Then the eighth oldest was a female who died in 12/02/2010 from pulmonary fibrosis.  She denies any bird exposure in the house.  Denies any mold.  Denies any down so first.  Denies any autoimmune disease.  Denies any Raynaud's.  No history of collagen vascular disease or connective tissue disease.  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  She was exposed to wood dust.  She is also worked in the [employer].  There is no acid reflux or MI or stroke.   In 12/01/2020 she did have a CT abdomen lung images show presence of ILD on report but I could not visualize this image.  But in 12-02-2018 for October she had a CT chest in the [facility] system that is  actually available for my visualization.  I agree with the NSIP report alternate diagnosis.  But she tells me that she is been short of breath for the last 6 months along with cough.  The cough started first.  In spring 2024 she got sick with a respiratory viral infection and it felt like the cough never went away.  Then in August 2020 for the shortness of breath started.  Is present with exertion relieved by rest.  Present for going from the bedroom to the bathroom washing close dishes and doing groceries.  She does not know if it is progressive but the son does feel it is progressive.  Both of them attest that a year ago around Christmas 2023 she was fine.   CT chest findings in ABD CT April 2022 -could not visualize this at all.  FINDINGS:  Lower chest: Diffuse chronic interstitial coarsening. The visualized  lung bases are otherwise clear. There is coronary vascular  calcification.     CT Chest oct 2024 at [facility] = personally visualized and agree with the findings   IMPRESSION:  1. Chronic interstitial lung disease with patchy multifocal  ground-glass attenuation, subpleural and peribronchovascular  reticulation and architectural distortion. Findings are most  consistent with chronic fibrotic interstitial lung disease,  potentially NSIP. Consider follow-up  high-resolution chest CT in 3-6  months.  2. No confluent airspace disease or suspicious pulmonary nodule  demonstrated.  3. No definite acute chest findings.  4.  Aortic Atherosclerosis (ICD10-I70.0).    Electronically Signed    By: Carey Bullocks M.D.    On: 06/25/2023 10:44    CT Chest data from date: 07/23/23 coronary CT  - personally visualized and independently interpreted : Yes - my findings are: NSIP groundglass opacities alternate diagnosis.  ADDENDUM REPORT: 08/09/2023 18:33   EXAM: OVER-READ INTERPRETATION  CT CHEST   The following report is an over-read performed by radiologist Dr. Curly Shores  [facility] on 08/09/2023. This over-read does not include interpretation of cardiac or coronary anatomy or pathology. The coronary CTA interpretation by the cardiologist is attached.   COMPARISON:  06/17/2023.   FINDINGS: Cardiovascular:  See findings discussed in the body of the report.   Mediastinum/Nodes: No suspicious adenopathy identified. Imaged mediastinal structures are unremarkable.   Lungs/Pleura: Extensive interstitial prominence diffusely consistent with pulmonary edema versus atypical infection or interstitial lung disease. No alveolar consolidation. No pleural effusion or pneumothorax.   Upper Abdomen: No acute abnormality.   Musculoskeletal: No chest wall abnormality. No acute osseous findings. There are thoracic degenerative changes.   IMPRESSION: Extensive interstitial prominence consistent with pulmonary edema versus atypical infection or interstitial lung disease.     Electronically Signed   By: Layla Maw M.D.   On: 08/09/2023 18:33  OV 10/09/2023  Subjective:  Patient ID: Kristin Bush, female , DOB: Jan 06, 1951 , age 34 y.o. , MRN: [id number] , ADDRESS: [address] [place] [address] PCP Ignatius Specking, MD Patient Care Team: Ignatius Specking, MD as PCP - General (Internal Medicine) Mallipeddi, Orion Modest, MD as PCP - Cardiology (Cardiology)  This Provider for this visit: Treatment Team:  Attending Provider: Kalman Shan, MD    10/09/2023 -   Chief Complaint  Patient presents with   Follow-up    Pt following up with full work up, labs, pft, and ono. Denies any concerns    #ILD workup in progress.  Does not have high-resolution CT scan chest as yet. Interim Health status: No new complaints No new medical problems. No new surgeries. No ER visits. No Urgent care visits. No changes to medications but she did do the ILD questionnaire which we reviewed.  HPI Kristin Bush 73 y.o. -   [facility] Comprehensive ILD  Questionnaire  Symptoms:   Symptoms started suddenly 6 months ago and since then it is progressive.  Cough started in April 2024.  She does cough at night.  She has some clear sputum she does clear her throat. Past Medical History :  -Positive for obesity - Has had COVID disease in September 2021. - Detail connective tissue disease is negative   ROS:  -Positive for fatigue for the last several months arthralgia for several years - Dry eyes for the last few years 5 pound weight loss for the last few weeks  FAMILY HISTORY of LUNG DISEASE:  -Multiple family members with pulmonary fibrosis.  Especially mother, 2 brothers and 1 sister.  Her sister got admitted 1 Easter for ILD.  Apparently it was fairly advanced.  She underwent biopsy in the lung collapse and then by 07/06/2025of that year she passed away.  Therefore patient is petrified of event undergoing general anesthesia and she does not want to do lung biopsy.  She is never even had colonoscopy.  PERSONAL EXPOSURE  HISTORY:  -She never smoked.  No marijuana no cocaine no intravenous drug use.  HOME  EXPOSURE and HOBBY DETAILS :  [place] but the home itself is 25 years.  There are some mold and mildew in the bathroom but otherwise detail organic antigen exposure history is negative.  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  She retired in 2019.  Before that for 15 years she worked in [employer].  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  She got exposed to cotton and nylon but she denies cutting nylon.  Before that she worked in [employer] for 30 years where she was exposed to wood dust.  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  She is also done drawl lumbar to finish goods.  During this time she was exposed to industrial  dust.  PULMONARY TOXICITY HISTORY (27 items):  Detail o medication history for pulmonary toxicity is negative  INVESTIGATIONS: Pulmonary function test shows at least moderate restriction with reduction in diffusion  No high-resolution CT chest has yet     OV 11/20/2023  Subjective:  Patient ID: Kristin Bush, female , DOB: 1951-01-03 , age 6 y.o. , MRN: [id number] , ADDRESS: [address] [place] [address] PCP Ignatius Specking, MD Patient Care Team: Ignatius Specking, MD as PCP - General (Internal Medicine) Mallipeddi, Orion Modest, MD as PCP - Cardiology (Cardiology)  This Provider for this visit: Treatment Team:  Attending Provider: Kalman Shan, MD    11/20/2023 -   Chief Complaint  Patient presents with   Follow-up    Increased SOB when lies down at night over the pastr 3 wks. She started OFEV 150 mg 1 wk ago- has had constipation and cramping occ.     #ILD-with family history.  Unclear specific variety  -March 2025: Genetic testing with Maylon Cos  -  single pathogenic variant in the [facility] gene. Specifically, this variant is c.500C>G (p.Ser167*).   - he expression of pathogenic PARN mutations can range from IPF to Dyskeratosis Congenita (DC).  DC is characterized by abnormal skin pigmentation, nail dystrophy, oral leukoplakia, progressive bone marrow failure, pulmonary fibrosis and increased risk for certain cancers such as squamous cell carcinoma and hematolymphoid neoplasms. Some individuals with a pathogenic PARN variant can develop a severe form of DC called Hoyeraal-Hreidarsson syndrome (HHS).   #Nintedanib/Ofev requires intensive drug monitoring due to high concerns for Adverse effects of , including  Drug Induced Liver Injury, significant GI side effects that include but not limited to Diarrhea, Nausea, Vomiting,  and other system side effects that include Fatigue,  weight loss. Cardiac side effects are a black box warning as well. These will be monitored with  blood work  such as LFT initially once a month for 6 months and then quarterly  -Started first dose March 2025  HPI JIMI GIZA 73 y.o. -returns for follow-up.  The main purpose of this visit is to see uptake with nintedanib now she is only been taking it for 1 week and she is already constipated.  Normally this drug causes diarrhea but it has been paradoxical for her.  She does have some mild cramping but there is no tenesmus.  There is no fevers no dysuria no blood in the stool.  She is also probably a little bit more tired at the end of the day since starting the nintedanib.  From a respiratory standpoint: She think she is stable.  She says shortness of breath of exertion going to the car is improved but by end of the day since starting nintedanib she is a little more tired.  Symptom score and walking desaturation test shows stability  From a ILD etiology standpoint: She had a high-resolution CT chest that I personally visualized it suggestive of NSIP.  I did not appreciate air trapping but the official report is still pending.  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  I personally communicated via email with the genetic counselor Maylon Cos.  The first patient she is seen with this mutation.  The details of the mutation are above.  I shared this with the patient.  She does have dry eye and dry mouth but I did not see any mechanic hands but I did advise the patient because of trace ANA positivity that we will check a myositis panel and she is in agreement.  Lung biopsy is under consideration but she is hesitant towards anesthesia procedures.  I told her I respected that and the best option would be to discuss in case conference and also monitor for progression and consider immunomodulatory therapy such as CellCept based on the outcomes of the conference and also progression.  She is again aligned with this.   SYMPTOM SCALE - ILD 10/09/2023 11/20/2023 OFEV  now  Current weight    O2 use ra ra  Shortness of Breath 0 -> 5 scale with 5 being worst (score 6 If unable to do)   At rest 3 2  Simple tasks - showers, clothes change, eating, shaving 3 2  Household (dishes, doing bed, laundry) 4 2  Shopping 4 3  Walking level at own pace 4 3  Walking up Stairs 4 3  Total (30-36) Dyspnea Score 21 15  How bad is your cough? 2 1  How bad is your fatigue 4 3  How bad is nausea 0 0  How bad is vomiting?  0 0  How bad is diarrhea? 0 0  How bad is anxiety? 4 3  How bad is depression 0 2  Any chronic pain - if so where and how bad 0 0        Simple office walk 224 (66+46 x 2) feet Pod A at [facility] x  3 laps goal with forehead probe 10/09/2023  11/20/2023 Wil NOT be able to sit/stand due to Knee and Back isssues  O2 used ra ra  Number laps completed Sit stand x 10 Walked 3 laps on room air  Comments about pace Moderate speed   Resting Pulse Ox/HR 95% % and 93/min 98% with a heart rate of 80  Final Pulse Ox/HR 90% and 118/min 92% with a heart rate of 115  Desaturated </= 88% no   Desaturated <= 3% points yes   Got Tachycardic >/= 90/min yes   Symptoms at end of test Dyspneic And short of breath  Miscellaneous comments x      PFT     Latest Ref Rng & Units 10/09/2023   12:51 PM  PFT Results  FVC-Pre L 1.38   FVC-Predicted Pre % 54   FVC-Post L 1.40   FVC-Predicted Post % 55   Pre FEV1/FVC % % 68   Post FEV1/FCV % % 77  FEV1-Pre L 0.94   FEV1-Predicted Pre % 48   FEV1-Post L 1.08   DLCO uncorrected ml/min/mmHg 11.63   DLCO UNC% % 68   DLCO corrected ml/min/mmHg 11.40   DLCO COR %Predicted % 66   DLVA Predicted % 113   TLC L 2.53   TLC % Predicted % 56   RV % Predicted % 61        LAB RESULTS last 96 hours No results found.       has a past medical history of Arthritis, Depression, Dyspnea, Family history of pancreatic cancer, Hypertension, SI (sacroiliac) joint dysfunction, Spondylolisthesis, Wears glasses, and Wears  partial dentures.   reports that she has never smoked. She has never used smokeless tobacco.  Past Surgical History:  Procedure Laterality Date   ABDOMINAL HYSTERECTOMY     BACK SURGERY  05/28/2018   PLIF   CATARACT EXTRACTION W/PHACO Left 10/06/2014   Procedure: CATARACT EXTRACTION PHACO AND INTRAOCULAR LENS PLACEMENT LEFT EYE;  Surgeon: Gemma Payor, MD;  Location: [facility];  Service: Ophthalmology;  Laterality: Left;  CDE:5.49   CATARACT EXTRACTION W/PHACO Right 10/17/2014   Procedure: CATARACT EXTRACTION PHACO AND INTRAOCULAR LENS PLACEMENT RIGHT EYE CDE=9.81;  Surgeon: Gemma Payor, MD;  Location: [facility];  Service: Ophthalmology;  Laterality: Right;   MULTIPLE TOOTH EXTRACTIONS     ROTATOR CUFF REPAIR     right shoulder   SACROILIAC JOINT FUSION Right 11/18/2019   Procedure: RIGHT SACROILIAC JOINT FUSION;  Surgeon: Estill Bamberg, MD;  Location: [facility];  Service: Orthopedics;  Laterality: Right;   SHOULDER ARTHROSCOPY WITH SUBACROMIAL DECOMPRESSION, ROTATOR CUFF REPAIR AND BICEP TENDON REPAIR Left 09/17/2018   Procedure: LEFT SHOULDER ARTHROSCOPY WITH DEBRIDEMENT, SUBACROMIAL DECOMPRESSION, ROTATOR CUFF;  Surgeon: Bjorn Pippin, MD;  Location: [facility];  Service: Orthopedics;  Laterality: Left;   TRANSFORAMINAL LUMBAR INTERBODY FUSION (TLIF) WITH PEDICLE SCREW FIXATION 1 LEVEL Right 08/09/2021   Procedure: RIGHT-SIDED LUMBAR 3 - LUMBAR 4 TRANSFORAMINAL LUMBAR INTERBODY FUSION WITH INSTRUMENTATION AND ALLOGRAFT;  Surgeon: Estill Bamberg, MD;  Location: [facility];  Service: Orthopedics;  Laterality: Right;   TUBAL LIGATION      No Known Allergies  Immunization History  Administered Date(s) Administered   Fluad Trivalent(High Dose 65+) 06/11/2023   Td (Adult),5 Lf Tetanus Toxid, Preservative Free 07/07/1998    Family History  Problem Relation Age of Onset   Pulmonary fibrosis Mother 38   Aneurysm Father 43   Diabetes Sister    Hypertension Sister    Pulmonary fibrosis  Sister 39   Heart attack Brother        d. 25s   Pulmonary fibrosis Brother 76   Pulmonary fibrosis Brother    Esophageal cancer Brother    Pancreatic cancer Brother    COPD Maternal Aunt    Stroke Paternal Aunt    Pneumonia Maternal Grandmother    Kidney disease Maternal Grandfather    Stroke Paternal Grandmother    Pulmonary fibrosis Cousin        mat first cousin     Current Outpatient Medications:    busPIRone (BUSPAR) 5 MG tablet, Take 5 mg by mouth 2 (two) times daily., Disp: , Rfl:    gabapentin (NEURONTIN) 300 MG capsule, Take 300 mg by mouth 2 (two) times daily., Disp: , Rfl:    Homeopathic Products (PROSACEA) GEL, Apply 1 application topically daily as needed (rosacea)., Disp: , Rfl:    meloxicam (MOBIC) 7.5 MG tablet, Take 7.5 mg by mouth daily., Disp: ,  Rfl:    Nintedanib (OFEV) 150 MG CAPS, Take 150 mg by mouth 2 (two) times daily., Disp: , Rfl:    traMADol (ULTRAM) 50 MG tablet, Take 50 mg by mouth 2 (two) times daily as needed., Disp: , Rfl:    triamterene-hydrochlorothiazide (DYAZIDE) 37.5-25 MG capsule, Take 1 tablet by mouth daily. , Disp: , Rfl:       Objective:   Vitals:   11/20/23 0929 11/20/23 0931  BP: 120/82   Pulse: 83   Temp:  97.8 F (36.6 C)  TempSrc:  Oral  SpO2: 100%   Weight:  174 lb 6.4 oz (79.1 kg)  Height:  5\' 2"  (1.575 m)    Estimated body mass index is 31.9 kg/m as calculated from the following:   Height as of this encounter: 5\' 2"  (1.575 m).   Weight as of this encounter: 174 lb 6.4 oz (79.1 kg).  @WEIGHTCHANGE @  [employer]   11/20/23 0931  Weight: 174 lb 6.4 oz (79.1 kg)     Physical Exam   General: No distress. obese O2 at rest: no Cane present: no Sitting in wheel chair: no Frail: no Obese: yes Neuro: Alert and Oriented x 3. GCS 15. Speech normal Psych: Pleasant Resp:  Barrel Chest - no.  Wheeze - no, Crackles - yes faint at basen, No overt respiratory distress CVS: Normal heart sounds. Murmurs - no Ext:  Stigmata of Connective Tissue Disease - no HEENT: Normal upper airway. PEERL +. No post nasal drip        Assessment:       ICD-10-CM   1. ILD (interstitial lung disease) (HCC)  J84.9 Hepatic function panel    Myositis Specific II Antibodies Panel    Sed Rate (ESR)    Sed Rate (ESR)    Hepatic function panel    2. Family history of pulmonary fibrosis  Z83.6 Hepatic function panel    Myositis Specific II Antibodies Panel    Sed Rate (ESR)    Sed Rate (ESR)    Hepatic function panel    3. Dry eye  H04.129 Hepatic function panel    Myositis Specific II Antibodies Panel    Sed Rate (ESR)    Sed Rate (ESR)    Hepatic function panel    4. Dry mouth  R68.2 Hepatic function panel    Myositis Specific II Antibodies Panel    Sed Rate (ESR)    Sed Rate (ESR)    Hepatic function panel    5. Encounter for therapeutic drug monitoring  Z51.81 Hepatic function panel    Myositis Specific II Antibodies Panel    Sed Rate (ESR)    Sed Rate (ESR)    Hepatic function panel    6. Drug induced constipation  K59.03 Hepatic function panel    Myositis Specific II Antibodies Panel    Sed Rate (ESR)    Sed Rate (ESR)    Hepatic function panel         Plan:     Patient Instructions     ICD-10-CM   1. ILD (interstitial lung disease) (HCC)  J84.9     2. Family history of pulmonary fibrosis  Z83.6     3. Dry eye  H04.129     4. Dry mouth  R68.2     5. Encounter for therapeutic drug monitoring  Z51.81     6. Drug induced constipation  K59.03       ILD (interstitial lung disease) (HCC) Family history of  pulmonary fibrosis PARM Mutation Dry eye Dry mouth    You definitely at least have moderate severity interstitial lung disease.  I think a family history of PARN Mutation is has  contributed to this.  This looks like it could be a variety called NSIP.  Official CT report is still pending  Dry eye and dry mouth that is unexplained.  Sjogren's ruled out but I do not know if it  is related to your mutation of PARN  Overnight pulse oximetry in January 2025 was borderline with 32 minutes of desaturation  Plan - Await official CT report - Check myositis antibody panel -Will discuss her case conference at some point in time -Respect fear of anesthesia for biopsy -Based on course might have to consider anti-inflammatory medication such as CellCept or Imuran -But at this point I just wanted to continue nintedanib and see how this works out for you side effect profile -Do spirometry and DLCO in 8 weeks -Can hold off on night oxygen but if you are really keen we can do 1 L at night    Encounter for therapeutic drug monitoring - OFEV Drug induced constipation   -Paradoxical constipation 1 week after starting nintedanib but managing with stool softener    Plan - Cotninue nintedanib per protocol -  -Check liver function test today   Follow-up - 8 weeks 30-minute visit with Dr. Marchelle Gearing but after spirometry and DLCO   FOLLOWUP Return in about 8 weeks (around 01/15/2024) for after Cleda Daub and DLCO, 30 min visit, with Dr Marchelle Gearing, Face to Face Visit, ILD.    SIGNATURE    Dr. Kalman Shan, M.D., F.C.C.P,  Pulmonary and Critical Care Medicine Staff Physician, [facility] Director - Interstitial Lung Disease  Program  Pulmonary Fibrosis [facility] [place], [place], [address]  Pager: [phone], If no answer or between  15:00h - 7:00h: call [phone] Telephone: [phone]  12:18 PM 11/20/2023

## 2023-12-16 DIAGNOSIS — Z1231 Encounter for screening mammogram for malignant neoplasm of breast: Secondary | ICD-10-CM | POA: Diagnosis not present

## 2024-01-20 ENCOUNTER — Ambulatory Visit: Payer: Self-pay | Admitting: Internal Medicine

## 2024-01-20 DIAGNOSIS — K5732 Diverticulitis of large intestine without perforation or abscess without bleeding: Secondary | ICD-10-CM | POA: Diagnosis not present

## 2024-01-20 DIAGNOSIS — Z90710 Acquired absence of both cervix and uterus: Secondary | ICD-10-CM | POA: Diagnosis not present

## 2024-01-20 DIAGNOSIS — I10 Essential (primary) hypertension: Secondary | ICD-10-CM | POA: Diagnosis not present

## 2024-01-20 DIAGNOSIS — K8689 Other specified diseases of pancreas: Secondary | ICD-10-CM | POA: Diagnosis not present

## 2024-01-20 DIAGNOSIS — K5792 Diverticulitis of intestine, part unspecified, without perforation or abscess without bleeding: Secondary | ICD-10-CM | POA: Diagnosis not present

## 2024-01-20 NOTE — Telephone Encounter (Signed)
 Copied from CRM [id number]. Topic: Clinical - Red Word Triage >> Jan 20, 2024 10:36 AM Ambrose Junk wrote: Red Word that prompted transfer to Nurse Triage:  Unable to eat due to medication, abd pain, nauseated... (medication taking:  Nintedanib (OFEV) 150 MG CAPS)  Per agent, pt hasn't eaten in 3 days, lot of nausea.  TRIAGE SUMMARY NOTE: Pt reporting that on Saturday morning she had diarrhea 3x then took imodium but since then abdominal pain throughout her abdomen has continued, "doing better today" at 9/10 pain, pain is constant and "real sharp." Pt reporting she is also experiencing severe nausea and no appetite, pt only has eaten "piece of toast and little something so can take medicine" throughout this time, pt reporting she "feel so weak." Pt confirms that she is not vomiting, still drinking water and keeping it down, confirms no more SOB than usual, no chest pain or other symptoms. Advised pt be examined in ED for severity of pain, pt refusing at this time since she believes the symptoms are related to ofev, having had similar episodes before with this med, pt requesting med for nausea. Advised pt go to ED, advised that sending HP message to pulm office for pt to receive call back with further recommendations as appropriate. Pt verbalized understanding. Please advise.  E2C2 Pulmonary Triage - Initial Assessment Questions "Chief Complaint (e.g., cough, sob, wheezing, fever, chills, sweat or additional symptoms) *Go to specific symptom protocol after initial questions. Saturday morning diarrhea real bad, sharp pains in stomach 3 days, not eaten, so nauseated not eaten meals since Friday Just piece of toast and little something so can take medicine Still drinking water and keeping down No vomiting Feel so weak Tried OTC for nausea but no help Have had this several times since started the med No more SOB than usual during this  "How long have symptoms been present?" Saturday  MEDICINES:   "Have  you used any OTC meds to help with symptoms?" Yes If yes, ask "What medications?" Imodium, OTC for nausea  Reason for Disposition  [1] SEVERE pain (e.g., excruciating) AND [2] present > 1 hour  Answer Assessment - Initial Assessment Questions 1. LOCATION: "Where does it hurt?"      All throughout abdomen 2. RADIATION: "Does the pain shoot anywhere else?" (e.g., chest, back)     no 3. ONSET: "When did the pain begin?" (e.g., minutes, hours or days ago)      Saturday 4. SUDDEN: "Gradual or sudden onset?"     sudden 5. PATTERN "Does the pain come and go, or is it constant?"    - If it comes and goes: "How long does it last?" "Do you have pain now?"     (Note: Comes and goes means the pain is intermittent. It goes away completely between bouts.)    - If constant: "Is it getting better, staying the same, or getting worse?"      (Note: Constant means the pain never goes away completely; most serious pain is constant and gets worse.)      Constant, doing better today 6. SEVERITY: "How bad is the pain?"  (e.g., Scale 1-10; mild, moderate, or severe)    - MILD (1-3): Doesn't interfere with normal activities, abdomen soft and not tender to touch.     - MODERATE (4-7): Interferes with normal activities or awakens from sleep, abdomen tender to touch.     - SEVERE (8-10): Excruciating pain, doubled over, unable to do any normal activities.  Real sharp, 9/10 even being a little better today 7. RECURRENT SYMPTOM: "Have you ever had this type of stomach pain before?" If Yes, ask: "When was the last time?" and "What happened that time?"      Every once in a while with ofev but not like it's been with this episode, nausea is so bad every time 8. CAUSE: "What do you think is causing the stomach pain?"     Thinks related to ofev 9. RELIEVING/AGGRAVATING FACTORS: "What makes it better or worse?" (e.g., antacids, bending or twisting motion, bowel movement)     no 10. OTHER SYMPTOMS: "Do you have any  other symptoms?" (e.g., back pain, diarrhea, fever, urination pain, vomiting)       Diarrhea 3x that morning only, took imodium  Protocols used: Abdominal Pain - [facility]

## 2024-01-20 NOTE — Telephone Encounter (Signed)
 Spoke with Pt advised to go to ED. She verbalized understanding NFN

## 2024-01-26 ENCOUNTER — Other Ambulatory Visit: Payer: Self-pay

## 2024-01-26 DIAGNOSIS — J849 Interstitial pulmonary disease, unspecified: Secondary | ICD-10-CM | POA: Diagnosis not present

## 2024-01-26 DIAGNOSIS — I10 Essential (primary) hypertension: Secondary | ICD-10-CM | POA: Diagnosis not present

## 2024-01-26 DIAGNOSIS — K5792 Diverticulitis of intestine, part unspecified, without perforation or abscess without bleeding: Secondary | ICD-10-CM | POA: Diagnosis not present

## 2024-01-26 DIAGNOSIS — I700 Atherosclerosis of aorta: Secondary | ICD-10-CM | POA: Diagnosis not present

## 2024-01-26 DIAGNOSIS — Z299 Encounter for prophylactic measures, unspecified: Secondary | ICD-10-CM | POA: Diagnosis not present

## 2024-01-27 ENCOUNTER — Encounter (INDEPENDENT_AMBULATORY_CARE_PROVIDER_SITE_OTHER): Payer: Self-pay | Admitting: *Deleted

## 2024-01-30 ENCOUNTER — Ambulatory Visit (INDEPENDENT_AMBULATORY_CARE_PROVIDER_SITE_OTHER): Admitting: Internal Medicine

## 2024-01-30 DIAGNOSIS — J849 Interstitial pulmonary disease, unspecified: Secondary | ICD-10-CM

## 2024-01-30 LAB — PULMONARY FUNCTION TEST
DL/VA % pred: 105 %
DL/VA: 4.51 ml/min/mmHg/L
DLCO unc % pred: 111 %
DLCO unc: 19.11 ml/min/mmHg
FEF 25-75 Pre: 0.64 L/s
FEF2575-%Pred-Pre: 38 %
FEV1-%Pred-Pre: 58 %
FEV1-Pre: 1.12 L
FEV1FVC-%Pred-Pre: 97 %
FEV6-%Pred-Pre: 62 %
FEV6-Pre: 1.52 L
FEV6FVC-%Pred-Pre: 104 %
FVC-%Pred-Pre: 59 %
FVC-Pre: 1.52 L
Pre FEV1/FVC ratio: 74 %
Pre FEV6/FVC Ratio: 100 %

## 2024-01-30 NOTE — Patient Instructions (Signed)
Spiro and DLCO performed today. 

## 2024-01-30 NOTE — Progress Notes (Signed)
Spiro and DLCO performed today. 

## 2024-02-05 ENCOUNTER — Other Ambulatory Visit: Payer: Self-pay | Admitting: Internal Medicine

## 2024-02-05 NOTE — Telephone Encounter (Signed)
 Per last office visit note, OFEV induced patient constipation. Please advise OFEV refill. Thank you.

## 2024-02-06 NOTE — Telephone Encounter (Signed)
 Refill pending OV on 02/10/24. Patient called regarding side effects on 01/20/2024.

## 2024-02-10 ENCOUNTER — Ambulatory Visit: Admitting: Internal Medicine

## 2024-02-10 VITALS — BP 122/82 | HR 88 | Ht 62.0 in | Wt 162.0 lb

## 2024-02-10 DIAGNOSIS — Z5181 Encounter for therapeutic drug level monitoring: Secondary | ICD-10-CM

## 2024-02-10 DIAGNOSIS — R700 Elevated erythrocyte sedimentation rate: Secondary | ICD-10-CM

## 2024-02-10 DIAGNOSIS — R109 Unspecified abdominal pain: Secondary | ICD-10-CM

## 2024-02-10 DIAGNOSIS — Z8719 Personal history of other diseases of the digestive system: Secondary | ICD-10-CM

## 2024-02-10 DIAGNOSIS — J849 Interstitial pulmonary disease, unspecified: Secondary | ICD-10-CM | POA: Diagnosis not present

## 2024-02-10 DIAGNOSIS — R110 Nausea: Secondary | ICD-10-CM | POA: Diagnosis not present

## 2024-02-10 DIAGNOSIS — Z836 Family history of other diseases of the respiratory system: Secondary | ICD-10-CM

## 2024-02-10 LAB — HEPATIC FUNCTION PANEL
ALT: 29 U/L (ref 0–35)
AST: 38 U/L — ABNORMAL HIGH (ref 0–37)
Albumin: 4.6 g/dL (ref 3.5–5.2)
Alkaline Phosphatase: 76 U/L (ref 39–117)
Bilirubin, Direct: 0.2 mg/dL (ref 0.0–0.3)
Total Bilirubin: 0.5 mg/dL (ref 0.2–1.2)
Total Protein: 8.3 g/dL (ref 6.0–8.3)

## 2024-02-10 NOTE — Telephone Encounter (Signed)
 Per OV note from today with Dr. Bertrum Brodie: Check liver function test today Stop nintedanib for a total of 2 weeks -.  Then restart at 1 tablet once daily for 1 week and then increase to regular strength of 1 tablet twice daily with food  Raine Blodgett, PharmD, MPH, BCPS, CPP Clinical Pharmacist (Rheumatology and Pulmonology)

## 2024-02-10 NOTE — Progress Notes (Signed)
 OV 08/12/2023 -new consult ILD center  Subjective:  Patient ID: Kristin Bush, female , DOB: 08-11-1951 , age 73 y.o. , MRN: [id number] , ADDRESS: [address] [place] [address] PCP Orlena Bitters, MD Patient Care Team: Orlena Bitters, MD as PCP - General (Internal Medicine) Mallipeddi, Kennyth Pean, MD as PCP - Cardiology (Cardiology)  This Provider for this visit: Treatment Team:  Attending Provider: Mannam, Praveen, MD    08/12/2023 -   Chief Complaint  Patient presents with   Consult    Sob, coughing with clear with mucus, using otc just started yesterday, having some tightness in chest , discuss results  ct scan that she had done on 06/17/23 at [facility]  , learn more about her dx and is this family hx. Discuss treatment         HPI Kristin Bush 73 y.o. -presents with her son Kristin Bush over concerns that she has interstitial lung disease.  She tells me that she has a strong family history of interstitial lung disease.  Her mother died 45 years ago from pulmonary fibrosis.  She is the youngest of 9 siblings and she is the fourth to have interstitial lung disease/pulmonary fibrosis.  The second older sibling brother died in 02/22/2005 from pulmonary fibrosis.  The fifth oldest died in 2016/02/23 from pulmonary fibrosis was a female.  Then the eighth oldest was a female who died in 23-Feb-2011 from pulmonary fibrosis.  She denies any bird exposure in the house.  Denies any mold.  Denies any down so first.  Denies any autoimmune disease.  Denies any Raynaud's.  No history of collagen vascular disease or connective tissue disease.  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  She was exposed to wood dust.  She is also worked in the [employer].  There is no acid reflux or MI or stroke.   In 02-22-21 she did have a CT abdomen lung images show presence of ILD on report but I could not visualize this image.  But in 23-Feb-2019 for October she had a CT chest in the [facility] system that is  actually available for my visualization.  I agree with the NSIP report alternate diagnosis.  But she tells me that she is been short of breath for the last 6 months along with cough.  The cough started first.  In spring 2024 she got sick with a respiratory viral infection and it felt like the cough never went away.  Then in August 2020 for the shortness of breath started.  Is present with exertion relieved by rest.  Present for going from the bedroom to the bathroom washing close dishes and doing groceries.  She does not know if it is progressive but the son does feel it is progressive.  Both of them attest that a year ago around Christmas 2023 she was fine.   CT chest findings in ABD CT April 2022 -could not visualize this at all.  FINDINGS:  Lower chest: Diffuse chronic interstitial coarsening. The visualized  lung bases are otherwise clear. There is coronary vascular  calcification.     CT Chest oct 2024 at [facility] = personally visualized and agree with the findings   IMPRESSION:  1. Chronic interstitial lung disease with patchy multifocal  ground-glass attenuation, subpleural and peribronchovascular  reticulation and architectural distortion. Findings are most  consistent with chronic fibrotic interstitial lung disease,  potentially NSIP. Consider  follow-up high-resolution chest CT in 3-6  months.  2. No confluent airspace disease or suspicious pulmonary nodule  demonstrated.  3. No definite acute chest findings.  4.  Aortic Atherosclerosis (ICD10-I70.0).    Electronically Signed    By: Elmon Hagedorn M.D.    On: 06/25/2023 10:44    CT Chest data from date: 07/23/23 coronary CT  - personally visualized and independently interpreted : Yes - my findings are: NSIP groundglass opacities alternate diagnosis.  ADDENDUM REPORT: 08/09/2023 18:33   EXAM: OVER-READ INTERPRETATION  CT CHEST   The following report is an over-read performed by radiologist Dr. Cyndia Drape  [facility] on 08/09/2023. This over-read does not include interpretation of cardiac or coronary anatomy or pathology. The coronary CTA interpretation by the cardiologist is attached.   COMPARISON:  06/17/2023.   FINDINGS: Cardiovascular:  See findings discussed in the body of the report.   Mediastinum/Nodes: No suspicious adenopathy identified. Imaged mediastinal structures are unremarkable.   Lungs/Pleura: Extensive interstitial prominence diffusely consistent with pulmonary edema versus atypical infection or interstitial lung disease. No alveolar consolidation. No pleural effusion or pneumothorax.   Upper Abdomen: No acute abnormality.   Musculoskeletal: No chest wall abnormality. No acute osseous findings. There are thoracic degenerative changes.   IMPRESSION: Extensive interstitial prominence consistent with pulmonary edema versus atypical infection or interstitial lung disease.     Electronically Signed   By: Sydell Eva M.D.   On: 08/09/2023 18:33  OV 10/09/2023  Subjective:  Patient ID: Kristin Bush, female , DOB: 02/28/1951 , age 67 y.o. , MRN: [id number] , ADDRESS: [address] [place] [address] PCP Orlena Bitters, MD Patient Care Team: Orlena Bitters, MD as PCP - General (Internal Medicine) Mallipeddi, Kennyth Pean, MD as PCP - Cardiology (Cardiology)  This Provider for this visit: Treatment Team:  Attending Provider: Maire Scot, MD    10/09/2023 -   Chief Complaint  Patient presents with   Follow-up    Pt following up with full work up, labs, pft, and ono. Denies any concerns    #ILD workup in progress.  Does not have high-resolution CT scan chest as yet. Interim Health status: No new complaints No new medical problems. No new surgeries. No ER visits. No Urgent care visits. No changes to medications but she did do the ILD questionnaire which we reviewed.  HPI Kristin Bush 73 y.o. -   [facility] Comprehensive ILD  Questionnaire  Symptoms:   Symptoms started suddenly 6 months ago and since then it is progressive.  Cough started in April 2024.  She does cough at night.  She has some clear sputum she does clear her throat. Past Medical History :  -Positive for obesity - Has had COVID disease in September 2021. - Detail connective tissue disease is negative   ROS:  -Positive for fatigue for the last several months arthralgia for several years - Dry eyes for the last few years 5 pound weight loss for the last few weeks  FAMILY HISTORY of LUNG DISEASE:  -Multiple family members with pulmonary fibrosis.  Especially mother, 2 brothers and 1 sister.  Her sister got admitted 1 Easter for ILD.  Apparently it was fairly advanced.  She underwent biopsy in the lung collapse and then by 07-06-2025of that year she passed away.  Therefore patient is petrified of event undergoing general anesthesia and she does not want to do lung biopsy.  She is never even had colonoscopy.  PERSONAL  EXPOSURE HISTORY:  -She never smoked.  No marijuana no cocaine no intravenous drug use.  HOME  EXPOSURE and HOBBY DETAILS :  [place] but the home itself is 25 years.  There are some mold and mildew in the bathroom but otherwise detail organic antigen exposure history is negative.  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  She retired in 2019.  Before that for 15 years she worked in [employer].  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  She got exposed to cotton and nylon but she denies cutting nylon.  Before that she worked in [employer] for 30 years where she was exposed to wood dust.  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  She is also done drawl lumbar to finish goods.  During this time she was exposed to industrial  dust.  PULMONARY TOXICITY HISTORY (27 items):  Detail o medication history for pulmonary toxicity is negative  INVESTIGATIONS: Pulmonary function test shows at least moderate restriction with reduction in diffusion  No high-resolution CT chest has yet     OV 11/20/2023  Subjective:  Patient ID: Kristin Bush, female , DOB: [birth year] , age 76 y.o. , MRN: [id number] , ADDRESS: [address] [place] [address] PCP Orlena Bitters, MD Patient Care Team: Orlena Bitters, MD as PCP - General (Internal Medicine) Mallipeddi, Kennyth Pean, MD as PCP - Cardiology (Cardiology)  This Provider for this visit: Treatment Team:  Attending Provider: Maire Scot, MD    11/20/2023 -   Chief Complaint  Patient presents with   Follow-up    Increased SOB when lies down at night over the pastr 3 wks. She started OFEV 150 mg 1 wk ago- has had constipation and cramping occ.     HPI Kristin Bush 73 y.o. -returns for follow-up.  The main purpose of this visit is to see uptake with nintedanib now she is only been taking it for 1 week and she is already constipated.  Normally this drug causes diarrhea but it has been paradoxical for her.  She does have some mild cramping but there is no tenesmus.  There is no fevers no dysuria no blood in the stool.  She is also probably a little bit more tired at the end of the day since starting the nintedanib.  From a respiratory standpoint: She think she is stable.  She says shortness of breath of exertion going to the car is improved but by end of the day since starting nintedanib she is a little more tired.  Symptom score and walking desaturation test shows stability  From a ILD etiology standpoint: She had a high-resolution CT chest that I personally visualized it suggestive of NSIP.  I did not appreciate air trapping but the official report is still pending.  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  I personally  communicated via email with the genetic counselor Marijo Shove.  The first patient she is seen with this mutation.  The details of the mutation are above.  I shared this with the patient.  She does have dry eye and dry mouth but I did not see any mechanic hands but I did advise the patient because of trace ANA  positivity that we will check a myositis panel and she is in agreement.  Lung biopsy is under consideration but she is hesitant towards anesthesia procedures.  I told her I respected that and the best option would be to discuss in case conference and also monitor for progression and consider immunomodulatory therapy such as CellCept based on the outcomes of the conference and also progression.  She is again aligned with this.   OV 02/10/2024  Subjective:  Patient ID: Kristin Bush, female , DOB: 1951/03/29 , age 21 y.o. , MRN: [id number] , ADDRESS: [address] [place] [address] PCP Orlena Bitters, MD Patient Care Team: Orlena Bitters, MD as PCP - General (Internal Medicine) Mallipeddi, Kennyth Pean, MD as PCP - Cardiology (Cardiology)  This Provider for this visit: Treatment Team:  Attending Provider: Maire Scot, MD    02/10/2024 -   Chief Complaint  Patient presents with   Interstitial Lung Disease     #ILD-with family history.  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 Specifically, this variant is c.500C>G (p.Ser167*).   - he expression of pathogenic PARN mutations can range from IPF to Dyskeratosis Congenita (DC).  DC is characterized by abnormal skin pigmentation, nail dystrophy, oral leukoplakia, progressive bone marrow failure, pulmonary fibrosis and increased risk for certain cancers such as squamous cell carcinoma and hematolymphoid neoplasms. Some individuals with a pathogenic PARN variant can develop a severe form of DC called Hoyeraal-Hreidarsson syndrome (HHS).    #Nintedanib/Ofev requires intensive drug monitoring due to high concerns for Adverse effects of , including  Drug Induced Liver Injury, significant GI side effects that include but not limited to Diarrhea, Nausea, Vomiting,  and other system side effects that include Fatigue,  weight loss. Cardiac side effects are a black box warning as well. These will be monitored with  blood work such as LFT initially once a month for 6 months and then quarterly  -Started first dose March 2025  HPI Kristin Bush 73 y.o. -returns for follow-up.  Presents with her daughter-in-law Milda Aline.  Since I last saw her in March 2025 she is continue to have constipation and then in mid May 2025 she ended up in the ER with diverticulitis.  She called our office and was sent to the ER.  She was then discharged on Flagyl and ciprofloxacin which she says "tore my stomach up".  She describes rumbling, pain, nausea and all of this was worse.  And then this was stopped and she was put on Augmentin which was also similar side effects but she finished 5 days of it "last Monday".  At this point in time she is better but she says she is still got diminished appetite and aversion to food nausea and abdominal cramping.  Throughout although she is continued her nintedanib.  I did indicate to her nintedanib can cause similar side effects.  She was surprised she does not want to stop the nintedanib ideally because of the interstitial lung disease and strong family history and propensity for progression.  But dyspnea wise she is stable.  Her exercise hypoxemia test is stable and her pulmonary function test is improved/stable.  Overall I believe the ILD itself is stable.    SYMPTOM SCALE - ILD 10/09/2023 11/20/2023 OFEV now -having paradoxical constipation 02/10/2024 Mid May 2020 for ER visit for diverticulitis  Current weight     O2 use ra  ra ra  Shortness of Breath 0 -> 5 scale with 5 being worst (score 6 If unable to do)    At rest 3 2 2    Simple tasks - showers, clothes change, eating, shaving 3 2 2   Household (dishes, doing bed, laundry) 4 2 2   Shopping 4 3 2   Walking level at own pace 4 3 3   Walking up Stairs 4 3 4   Total (30-36) Dyspnea Score 21 15 15   How bad is your cough? 2 1 2   How bad is your fatigue 4 3 4   How bad is nausea 0 0 5  How bad is vomiting?  0 0 0  How bad is diarrhea? 0 0 3  How bad is anxiety? 4 3 2   How bad is depression 0 2 0  Any chronic pain - if so where and how bad 0 0 0        Simple office walk 224 (66+46 x 2) feet Pod A at [facility] x  3 laps goal with forehead probe 10/09/2023  11/20/2023 Wil NOT be able to sit/stand due to Knee and Back isssues 02/10/2024   O2 used ra ra   Number laps completed Sit stand x 10 Walked 3 laps on room air   Comments about pace Moderate speed    Resting Pulse Ox/HR 95% % and 93/min 98% with a heart rate of 80   Final Pulse Ox/HR 90% and 118/min 92% with a heart rate of 115   Desaturated </= 88% no    Desaturated <= 3% points yes    Got Tachycardic >/= 90/min yes    Symptoms at end of test Dyspneic And short of breath   Miscellaneous comments x          SIT STAND TEST - goal 15 times   02/10/2024    O2 used ra   PRobe - finter or forehead finger   Number sit and stand completed - goal 15 15   Time taken to complete 2 min   Resting Pulse Ox/HR/Dyspnea  98% and 87/min and dyspnea of 0/10    Peak measures 97 % and 99/min and dyspnea of 2/10   Final Pulse Ox/HR 97% and 90/min and dyspnea of 1/10   Desaturated </= 88% no   Desaturated <= 3% points no   Got Tachycardic >/= 90/min yes   Miscellaneous comments x     CT Chest data from date: March 2025 Narrative & Impression  CLINICAL DATA:  Diffuse/interstitial lung disease. Chronic congestion and wheezing.   EXAM: CT CHEST WITHOUT CONTRAST   TECHNIQUE: Multidetector CT imaging of the chest was performed following the standard protocol without intravenous contrast. High  resolution imaging of the lungs, as well as inspiratory and expiratory imaging, was performed.   RADIATION DOSE REDUCTION: This exam was performed according to the departmental dose-optimization program which includes automated exposure control, adjustment of the mA and/or kV according to patient size and/or use of iterative reconstruction technique.   COMPARISON:  06/17/2023.   FINDINGS: Cardiovascular: Atherosclerotic calcification of the aorta and left anterior descending coronary artery. Heart is at the upper limits of normal in size to mildly enlarged. No pericardial effusion.   Mediastinum/Nodes: No pathologically enlarged mediastinal or axillary lymph nodes. Hilar regions are difficult to definitively evaluate without IV contrast. Esophagus is grossly unremarkable.   Lungs/Pleura: Interstitial coarsened ground-glass with subpleural reticulation, ground-glass and traction bronchiectasis/bronchiolectasis, findings similar to 06/17/2023. There may be slight basilar predominance of the findings.  Expiratory phase imaging was not performed in true expiration, limiting the evaluation for air trapping. No pleural fluid. Airway is unremarkable.   Upper Abdomen: Peripherally calcified splenic artery aneurysm measures 7 mm. Visualized portions of the liver, gallbladder, adrenal glands, kidneys, spleen, pancreas, stomach and bowel are otherwise grossly unremarkable. No upper abdominal adenopathy.   Musculoskeletal: Degenerative changes in the spine.   IMPRESSION: 1. Pulmonary parenchymal pattern of interstitial lung may be due to fibrotic nonspecific interstitial pneumonitis or usual interstitial pneumonitis. Findings are indeterminate for UIP per consensus guidelines: Diagnosis of Idiopathic Pulmonary Fibrosis: An Official ATS/ERS/JRS/ALAT Clinical Practice Guideline. Am Annie Barton Crit Care Med Vol 198, Iss 5, [phone], May 10 2017. 2. 7 mm peripherally calcified splenic artery  aneurysm. 3. Aortic atherosclerosis (ICD10-I70.0). Left anterior descending coronary artery calcification.     Electronically Signed   By: Shearon Denis M.D.   On: 11/20/2023 16:19      PFT     Latest Ref Rng & Units 01/30/2024   12:36 PM 10/09/2023   12:51 PM  PFT Results  FVC-Pre L 1.52  1.38   FVC-Predicted Pre % 59  54   FVC-Post L  1.40   FVC-Predicted Post %  55   Pre FEV1/FVC % % 74  68   Post FEV1/FCV % %  77   FEV1-Pre L 1.12  0.94   FEV1-Predicted Pre % 58  48   FEV1-Post L  1.08   DLCO uncorrected ml/min/mmHg 19.11  11.63   DLCO UNC% % 111  68   DLCO corrected ml/min/mmHg  11.40   DLCO COR %Predicted %  66   DLVA Predicted % 105  113   TLC L  2.53   TLC % Predicted %  56   RV % Predicted %  61        LAB RESULTS last 96 hours No results found.       has a past medical history of Arthritis, Depression, Dyspnea, Family history of pancreatic cancer, Hypertension, SI (sacroiliac) joint dysfunction, Spondylolisthesis, Wears glasses, and Wears partial dentures.   reports that she has never smoked. She has never used smokeless tobacco.  Past Surgical History:  Procedure Laterality Date   ABDOMINAL HYSTERECTOMY     BACK SURGERY  05/28/2018   PLIF   CATARACT EXTRACTION W/PHACO Left 10/06/2014   Procedure: CATARACT EXTRACTION PHACO AND INTRAOCULAR LENS PLACEMENT LEFT EYE;  Surgeon: Anner Kill, MD;  Location: [facility];  Service: Ophthalmology;  Laterality: Left;  CDE:5.49   CATARACT EXTRACTION W/PHACO Right 10/17/2014   Procedure: CATARACT EXTRACTION PHACO AND INTRAOCULAR LENS PLACEMENT RIGHT EYE CDE=9.81;  Surgeon: Anner Kill, MD;  Location: [facility];  Service: Ophthalmology;  Laterality: Right;   MULTIPLE TOOTH EXTRACTIONS     ROTATOR CUFF REPAIR     right shoulder   SACROILIAC JOINT FUSION Right 11/18/2019   Procedure: RIGHT SACROILIAC JOINT FUSION;  Surgeon: Virl Grimes, MD;  Location: [facility];  Service: Orthopedics;  Laterality: Right;   SHOULDER  ARTHROSCOPY WITH SUBACROMIAL DECOMPRESSION, ROTATOR CUFF REPAIR AND BICEP TENDON REPAIR Left 09/17/2018   Procedure: LEFT SHOULDER ARTHROSCOPY WITH DEBRIDEMENT, SUBACROMIAL DECOMPRESSION, ROTATOR CUFF;  Surgeon: Micheline Ahr, MD;  Location:  SURGERY CENTER;  Service: Orthopedics;  Laterality: Left;   TRANSFORAMINAL LUMBAR INTERBODY FUSION (TLIF) WITH PEDICLE SCREW FIXATION 1 LEVEL Right 08/09/2021   Procedure: RIGHT-SIDED LUMBAR 3 - LUMBAR 4 TRANSFORAMINAL LUMBAR INTERBODY FUSION WITH INSTRUMENTATION AND ALLOGRAFT;  Surgeon: Virl Grimes, MD;  Location: [facility];  Service: Orthopedics;  Laterality: Right;   TUBAL LIGATION      No Known Allergies  Immunization History  Administered Date(s) Administered   Fluad Trivalent(High Dose 65+) 06/11/2023   Td (Adult),5 Lf Tetanus Toxid, Preservative Free 07/07/1998    Family History  Problem Relation Age of Onset   Pulmonary fibrosis Mother 90   Aneurysm Father 42   Diabetes Sister    Hypertension Sister    Pulmonary fibrosis Sister 57   Heart attack Brother        d. 69s   Pulmonary fibrosis Brother 45   Pulmonary fibrosis Brother    Esophageal cancer Brother    Pancreatic cancer Brother    COPD Maternal Aunt    Stroke Paternal Aunt    Pneumonia Maternal Grandmother    Kidney disease Maternal Grandfather    Stroke Paternal Grandmother    Pulmonary fibrosis Cousin        mat first cousin     Current Outpatient Medications:    busPIRone (BUSPAR) 5 MG tablet, Take 5 mg by mouth 2 (two) times daily., Disp: , Rfl:    gabapentin (NEURONTIN) 300 MG capsule, Take 300 mg by mouth 2 (two) times daily., Disp: , Rfl:    Homeopathic Products (PROSACEA) GEL, Apply 1 application topically daily as needed (rosacea)., Disp: , Rfl:    meloxicam  (MOBIC ) 7.5 MG tablet, Take 7.5 mg by mouth daily., Disp: , Rfl:    Nintedanib (OFEV) 150 MG CAPS, Take 150 mg by mouth 2 (two) times daily., Disp: , Rfl:    traMADol (ULTRAM) 50 MG tablet, Take 50  mg by mouth 2 (two) times daily as needed., Disp: , Rfl:    triamterene -hydrochlorothiazide  (DYAZIDE ) 37.5-25 MG capsule, Take 1 tablet by mouth daily. , Disp: , Rfl:       Objective:   Vitals:   02/10/24 0900  BP: 122/82  Pulse: 88  SpO2: 97%  Weight: 162 lb (73.5 kg)  Height: 5\' 2"  (1.575 m)    Estimated body mass index is 29.63 kg/m as calculated from the following:   Height as of this encounter: 5\' 2"  (1.575 m).   Weight as of this encounter: 162 lb (73.5 kg).  @WEIGHTCHANGE @  [employer]   02/10/24 0900  Weight: 162 lb (73.5 kg)     Physical Exam   General: No distress. Looks wel O2 at rest: no Cane present: no Sitting in wheel chair: no Frail: no Obese: yes Neuro: Alert and Oriented x 3. GCS 15. Speech normal Psych: Pleasant Resp:  Barrel Chest - no.  Wheeze - no, Crackles - YES BASE, No overt respiratory distress CVS: Normal heart sounds. Murmurs - no Ext: Stigmata of Connective Tissue Disease - no HEENT: Normal upper airway. PEERL +. No post nasal drip        Assessment:       ICD-10-CM   1. ILD (interstitial lung disease) (HCC)  J84.9 Hepatic function panel    Myositis Specific II Antibodies Panel    2. Family history of pulmonary fibrosis  Z83.6 Hepatic function panel    Myositis Specific II Antibodies Panel    3. Encounter for therapeutic drug monitoring  Z51.81 Hepatic function panel    Myositis Specific II Antibodies Panel    4. Elevated sed rate  R70.0 Hepatic function panel    Myositis Specific II Antibodies Panel    5. Abdominal pain, unspecified abdominal location  R10.9 Hepatic function panel    Myositis Specific II Antibodies Panel  6. Nausea  R11.0 Hepatic function panel    Myositis Specific II Antibodies Panel    7. History of diverticulitis  Z87.19 Hepatic function panel    Myositis Specific II Antibodies Panel         Plan:     Patient Instructions     ICD-10-CM   1. ILD (interstitial lung disease) (HCC)  J84.9      2. Family history of pulmonary fibrosis  Z83.6     3. Encounter for therapeutic drug monitoring  Z51.81     4. Abdominal pain, unspecified abdominal location  R10.9     5. Nausea  R11.0     6. History of diverticulitis  Z87.19         ILD (interstitial lung disease) (HCC) Family history of pulmonary fibrosis PARM Mutation Dry eye Dry mouth Overnight desaturation for 32 minutes early 2025   You definitely at least have moderate severity interstitial lung disease.  I think a family history of PARN Mutation (causes short telomoeres) is has  contributed to this.  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     Plan - Check myositis antibody panel -Will discuss her case conference at some point in time -Respect fear of anesthesia for biopsy -Based on course might have to consider anti-inflammatory medication such as CellCept or Imuran - Nintedanib therapy as below - We can discuss family history and any further genetic interventions in the future   Encounter for therapeutic drug monitoring - OFEV Drug induced constipation History of diverticulitis mid May 2025 and ongoing abdominal symptoms.   -Paradoxical constipation 1 week after starting nintedanib but managing with stool softener as a spring 2025 but later course in May 2025 complicated by diverticulitis - At this current visit is still having some nausea and abdominal cramping    Plan Check liver function test today Stop nintedanib for a total of 2 weeks -.  Then restart at 1 tablet once daily for 1 week and then increase to regular strength of 1 tablet twice daily with food Monitor your GI symptoms   Follow-up - 6-8 weeks with nurse practitioner and can be a video visit    FOLLOWUP Return in about 6 weeks (around 03/23/2024) for with any of the [facility], Face to Face OR Video Visit.  ( Level 05 visit E&M 2024: Estb >= 40 min  in  visit type: on-site physical face to visit  in total care time and counseling  or/and coordination of care by this undersigned MD - Dr Maire Scot. This includes one or more of the following on this same day 02/10/2024: pre-charting, chart review, note writing, documentation discussion of test results, diagnostic or treatment recommendations, prognosis, risks and benefits of management options, instructions, education, compliance or risk-factor reduction. It excludes time spent by the CMA or office staff in the care of the patient. Actual time 41 min)   SIGNATURE    Dr. Maire Scot, M.D., F.C.C.P,  Pulmonary and Critical Care Medicine Staff Physician, [facility] Center Director - Interstitial Lung Disease  Program  Pulmonary Fibrosis [facility] at [facility] [place], [place], [address]  Pager: [phone], If no answer or between  15:00h - 7:00h: call [phone] Telephone: [phone]  9:39 AM 02/10/2024

## 2024-02-10 NOTE — Patient Instructions (Addendum)
 ICD-10-CM   1. ILD (interstitial lung disease) (HCC)  J84.9     2. Family history of pulmonary fibrosis  Z83.6     3. Encounter for therapeutic drug monitoring  Z51.81     4. Abdominal pain, unspecified abdominal location  R10.9     5. Nausea  R11.0     6. History of diverticulitis  Z87.19         ILD (interstitial lung disease) (HCC) Family history of pulmonary fibrosis PARM Mutation Dry eye Dry mouth Overnight desaturation for 32 minutes early 2025   You definitely at least have moderate severity interstitial lung disease.  I think a family history of PARN Mutation (causes short telomoeres) is has  contributed to this.  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     Plan - Check myositis antibody panel -Will discuss her case conference at some point in time -Respect fear of anesthesia for biopsy -Based on course might have to consider anti-inflammatory medication such as CellCept or Imuran - Nintedanib therapy as below - We can discuss family history and any further genetic interventions in the future   Encounter for therapeutic drug monitoring - OFEV Drug induced constipation History of diverticulitis mid May 2025 and ongoing abdominal symptoms.   -Paradoxical constipation 1 week after starting nintedanib but managing with stool softener as a spring 2025 but later course in May 2025 complicated by diverticulitis - At this current visit is still having some nausea and abdominal cramping    Plan Check liver function test today Stop nintedanib for a total of 2 weeks -.  Then restart at 1 tablet once daily for 1 week and then increase to regular strength of 1 tablet twice daily with food Monitor your GI symptoms   Follow-up - 6-8 weeks with nurse practitioner and can be a video visit

## 2024-02-14 LAB — MYOSITIS SPECIFIC II ANTIBODIES PANEL
EJ AB: <11 SI (ref <11)
JO-1 AB: <11 SI (ref <11)
MDA-5 AB: <11 SI (ref <11)
MI-2 ALPHA AB: <11 SI (ref <11)
MI-2 BETA AB: <11 SI (ref <11)
NXP-2 AB: <11 SI (ref <11)
OJ AB: <11 SI (ref <11)
PL-12 AB: <11 SI (ref <11)
PL-7 AB: <11 SI (ref <11)
SRP-AB: <11 SI (ref <11)
TIF-1y AB: <11 SI (ref <11)

## 2024-02-20 ENCOUNTER — Other Ambulatory Visit: Payer: Self-pay | Admitting: Orthopedic Surgery

## 2024-02-20 DIAGNOSIS — M48061 Spinal stenosis, lumbar region without neurogenic claudication: Secondary | ICD-10-CM | POA: Diagnosis not present

## 2024-02-20 DIAGNOSIS — M5416 Radiculopathy, lumbar region: Secondary | ICD-10-CM

## 2024-02-23 ENCOUNTER — Encounter (INDEPENDENT_AMBULATORY_CARE_PROVIDER_SITE_OTHER): Payer: Self-pay | Admitting: *Deleted

## 2024-02-23 ENCOUNTER — Other Ambulatory Visit (INDEPENDENT_AMBULATORY_CARE_PROVIDER_SITE_OTHER): Payer: Self-pay | Admitting: *Deleted

## 2024-02-23 ENCOUNTER — Encounter (INDEPENDENT_AMBULATORY_CARE_PROVIDER_SITE_OTHER): Payer: Self-pay | Admitting: Gastroenterology

## 2024-02-23 ENCOUNTER — Ambulatory Visit (INDEPENDENT_AMBULATORY_CARE_PROVIDER_SITE_OTHER): Admitting: Gastroenterology

## 2024-02-23 VITALS — BP 149/92 | HR 81 | Temp 97.8°F | Ht 62.0 in | Wt 166.6 lb

## 2024-02-23 DIAGNOSIS — K529 Noninfective gastroenteritis and colitis, unspecified: Secondary | ICD-10-CM | POA: Insufficient documentation

## 2024-02-23 DIAGNOSIS — Z8719 Personal history of other diseases of the digestive system: Secondary | ICD-10-CM

## 2024-02-23 DIAGNOSIS — T50995A Adverse effect of other drugs, medicaments and biological substances, initial encounter: Secondary | ICD-10-CM

## 2024-02-23 DIAGNOSIS — K5903 Drug induced constipation: Secondary | ICD-10-CM

## 2024-02-23 DIAGNOSIS — K5732 Diverticulitis of large intestine without perforation or abscess without bleeding: Secondary | ICD-10-CM

## 2024-02-23 DIAGNOSIS — K5900 Constipation, unspecified: Secondary | ICD-10-CM | POA: Insufficient documentation

## 2024-02-23 MED ORDER — PEG 3350-KCL-NA BICARB-NACL 420 G PO SOLR: 4000.0000 mL | Freq: Once | ORAL | 0 refills | Status: AC

## 2024-02-23 NOTE — Progress Notes (Unsigned)
 Kristin Bush, M.D. Gastroenterology & Hepatology [facility] [address] [place], [place] [address] Primary Care Physician: Orlena Bitters, MD [address] [place] [address]  Referring MD: PCP  Chief Complaint: Diverticulitis  History of Present Illness: Kristin Bush is a 73 y.o. female with past medical history of depression, hypertension, interstitial lung disease, arthritis, who presents for evaluation of diverticulitis.  Patient reports that she was started on nintenadib in March 2025 by her pulmonologist and developed constipation and burning pain in her abdomen diffusely.  Patient reports around mid May she developed new onset diarrhea and lower abdominal pain, which lasted 4 days and was very significant, along with severe nausea.  Patient was seen at [facility] on 01/20/2024.  Labs showed normal CBC with WBC 6.7, hemoglobin 15.3 and platelets 292. CMP with potassium 3.4, rest of electrolytes and renal function were normal.  AST 38, ALT 39, total bilirubin 0.6, alkaline phosphatase   Normal.  Urinalysis was largely unremarkable.CT of the abdomen and pelvis without contrast reported acute sigmoid diverticulitis. Patient was prescribed ciprofloxacin and metronidazole.   Patient reports that she took the antibiotics for 4 days but did not feel any better. Due to the persistence of symptoms , she was advised by her PCP to stop her antibiotics and daughter believes she was prescribed Augmentin - possibly took it for 10 days. She was also advised to stop her nintenadib.  Patient will be restarted on nintenadib tomorrow.  She is fearful this may cause recurrent constipation.  Patient reports that she has felt much better after finishing her antibiotics. She is having a BM every 2 days, no diarrhea. Has minimal discomfort in her lower abdomen.  The patient denies having any nausea, vomiting, fever, chills, hematochezia, melena,  hematemesis, diarrhea, jaundice, pruritus. Lost some weight after episode of diverticulitis.  Does not take any NSAIDs or high dose aspirin.  Last EXB:MWUXL Last Colonoscopy:never  FHx: neg for any gastrointestinal/liver disease, brother pancreatic cancer, brother esophagus cancer Social: neg smoking, alcohol  or illicit drug use Surgical: no abdominal surgeries  Past Medical History: Past Medical History:  Diagnosis Date   Arthritis    Depression    Dyspnea    Family history of pancreatic cancer    Hypertension    SI (sacroiliac) joint dysfunction    Spondylolisthesis    Wears glasses    Wears partial dentures     Past Surgical History: Past Surgical History:  Procedure Laterality Date   ABDOMINAL HYSTERECTOMY     BACK SURGERY  05/28/2018   PLIF   CATARACT EXTRACTION W/PHACO Left 10/06/2014   Procedure: CATARACT EXTRACTION PHACO AND INTRAOCULAR LENS PLACEMENT LEFT EYE;  Surgeon: Anner Kill, MD;  Location: [facility];  Service: Ophthalmology;  Laterality: Left;  CDE:5.49   CATARACT EXTRACTION W/PHACO Right 10/17/2014   Procedure: CATARACT EXTRACTION PHACO AND INTRAOCULAR LENS PLACEMENT RIGHT EYE CDE=9.81;  Surgeon: Anner Kill, MD;  Location: [facility];  Service: Ophthalmology;  Laterality: Right;   MULTIPLE TOOTH EXTRACTIONS     ROTATOR CUFF REPAIR     right shoulder   SACROILIAC JOINT FUSION Right 11/18/2019   Procedure: RIGHT SACROILIAC JOINT FUSION;  Surgeon: Virl Grimes, MD;  Location: [facility];  Service: Orthopedics;  Laterality: Right;   SHOULDER ARTHROSCOPY WITH SUBACROMIAL DECOMPRESSION, ROTATOR CUFF REPAIR AND BICEP TENDON REPAIR Left 09/17/2018   Procedure: LEFT SHOULDER ARTHROSCOPY WITH DEBRIDEMENT, SUBACROMIAL DECOMPRESSION, ROTATOR CUFF;  Surgeon: Micheline Ahr, MD;  Location: [facility];  Service: Orthopedics;  Laterality: Left;   TRANSFORAMINAL LUMBAR INTERBODY FUSION (TLIF) WITH PEDICLE SCREW FIXATION 1 LEVEL Right 08/09/2021   Procedure: RIGHT-SIDED  LUMBAR 3 - LUMBAR 4 TRANSFORAMINAL LUMBAR INTERBODY FUSION WITH INSTRUMENTATION AND ALLOGRAFT;  Surgeon: Virl Grimes, MD;  Location: [facility];  Service: Orthopedics;  Laterality: Right;   TUBAL LIGATION      Family History: Family History  Problem Relation Age of Onset   Pulmonary fibrosis Mother 17   Aneurysm Father 41   Diabetes Sister    Hypertension Sister    Pulmonary fibrosis Sister 81   Heart attack Brother        d. 58s   Pulmonary fibrosis Brother 24   Pulmonary fibrosis Brother    Esophageal cancer Brother    Pancreatic cancer Brother    COPD Maternal Aunt    Stroke Paternal Aunt    Pneumonia Maternal Grandmother    Kidney disease Maternal Grandfather    Stroke Paternal Grandmother    Pulmonary fibrosis Cousin        mat first cousin    Social History: Social History   Tobacco Use  Smoking Status Never  Smokeless Tobacco Never   Social History   Substance and Sexual Activity  Alcohol  Use No   Social History   Substance and Sexual Activity  Drug Use No    Allergies: No Known Allergies  Medications: Current Outpatient Medications  Medication Sig Dispense Refill   busPIRone (BUSPAR) 5 MG tablet Take 5 mg by mouth 2 (two) times daily.     gabapentin (NEURONTIN) 300 MG capsule Take 300 mg by mouth 2 (two) times daily.     Homeopathic Products (PROSACEA) GEL Apply 1 application topically daily as needed (rosacea).     meloxicam  (MOBIC ) 7.5 MG tablet Take 7.5 mg by mouth daily.     traMADol (ULTRAM) 50 MG tablet Take 50 mg by mouth 2 (two) times daily as needed.     triamterene -hydrochlorothiazide  (DYAZIDE ) 37.5-25 MG capsule Take 1 tablet by mouth daily.      OFEV 150 MG CAPS TAKE 1 CAPSULE BY MOUTH TWICE DAILY (12 HOURS APART) WITH FOOD (Patient not taking: Reported on 02/23/2024) 180 capsule 1   No current facility-administered medications for this visit.    Review of Systems: GENERAL: negative for malaise, night sweats HEENT: No changes in hearing  or vision, no nose bleeds or other nasal problems. NECK: Negative for lumps, goiter, pain and significant neck swelling RESPIRATORY: Negative for cough, wheezing CARDIOVASCULAR: Negative for chest pain, leg swelling, palpitations, orthopnea GI: SEE HPI MUSCULOSKELETAL: Negative for joint pain or swelling, back pain, and muscle pain. SKIN: Negative for lesions, rash PSYCH: Negative for sleep disturbance, mood disorder and recent psychosocial stressors. HEMATOLOGY Negative for prolonged bleeding, bruising easily, and swollen nodes. ENDOCRINE: Negative for cold or heat intolerance, polyuria, polydipsia and goiter. NEURO: negative for tremor, gait imbalance, syncope and seizures. The remainder of the review of systems is noncontributory.   Physical Exam: BP (!) 149/92 (BP Location: Left Arm, Patient Position: Sitting, Cuff Size: Large)   Pulse 81   Temp 97.8 F (36.6 C) (Temporal)   Ht 5' 2 (1.575 m)   Wt 166 lb 9.6 oz (75.6 kg)   BMI 30.47 kg/m  GENERAL: The patient is AO x3, in no acute distress. HEENT: Head is normocephalic and atraumatic. EOMI are intact. Mouth is well hydrated and without lesions. NECK: Supple. No masses LUNGS: Clear to auscultation. No presence of rhonchi/wheezing/rales. Adequate  chest expansion HEART: RRR, normal s1 and s2. ABDOMEN: Soft, nontender, no guarding, no peritoneal signs, and nondistended. BS +. No masses. EXTREMITIES: Without any cyanosis, clubbing, rash, lesions or edema. NEUROLOGIC: AOx3, no focal motor deficit. SKIN: no jaundice, no rashes   Imaging/Labs: as above  I personally reviewed and interpreted the available labs, imaging and endoscopic files.  Impression and Plan: Kristin Bush is a 73 y.o. female with past medical history of depression, hypertension, interstitial lung disease, arthritis, who presents for evaluation of diverticulitis.  Patient had 1 isolated episode of diverticulitis that has resolved after receiving antibiotic  course.  Denies any complaints or any active symptoms at the moment.  We discussed that it is possible she may have developed diverticulitis as an isolated episode but as she has never had a colonoscopy, we will need to rule out malignancy as a potential etiology for this.  She is in agreement to proceed with this 8 weeks after her episode of diverticulitis.  She was advised to avoid NSAIDs in the future.  Notably, she has also presented some paradoxical constipation with the use of nintedanib.  As she will be restarted on this medication, I advised her to start taking MiraLAX on a regular basis to avoid recurrent constipation episodes.  -Schedule colonoscopy -After starting  nintenadib tomorrow, start taking Miralax 1 capful every day for one week. If bowel movements do not improve, increase to 2 capfuls every day. If after two weeks there is no improvement, increase to 2 capfuls in AM and one at night.  All questions were answered.      Kristin Cress, MD Gastroenterology and Hepatology [facility]

## 2024-02-23 NOTE — Patient Instructions (Addendum)
 Schedule colonoscopy After starting  nintenadib tomorrow, start taking Miralax 1 capful every day for one week. If bowel movements do not improve, increase to 2 capfuls every day. If after two weeks there is no improvement, increase to 2 capfuls in AM and one at night.

## 2024-02-23 NOTE — Addendum Note (Signed)
 Addended by: Alvester Johnson on: 02/23/2024 03:26 PM   Modules accepted: Orders

## 2024-02-23 NOTE — H&P (View-Only) (Signed)
 Kristin Bush, M.D. Gastroenterology & Hepatology [facility] [address] [place], [place] [address] Primary Care Physician: Orlena Bitters, MD [address] [place] [address]  Referring MD: PCP  Chief Complaint: Diverticulitis  History of Present Illness: Kristin Bush is a 73 y.o. female with past medical history of depression, hypertension, interstitial lung disease, arthritis, who presents for evaluation of diverticulitis.  Patient reports that she was started on nintenadib in March 2025 by her pulmonologist and developed constipation and burning pain in her abdomen diffusely.  Patient reports around mid May she developed new onset diarrhea and lower abdominal pain, which lasted 4 days and was very significant, along with severe nausea.  Patient was seen at [facility] ER on 01/20/2024.  Labs showed normal CBC with WBC 6.7, hemoglobin 15.3 and platelets 292. CMP with potassium 3.4, rest of electrolytes and renal function were normal.  AST 38, ALT 39, total bilirubin 0.6, alkaline phosphatase   Normal.  Urinalysis was largely unremarkable.CT of the abdomen and pelvis without contrast reported acute sigmoid diverticulitis. Patient was prescribed ciprofloxacin and metronidazole.   Patient reports that she took the antibiotics for 4 days but did not feel any better. Due to the persistence of symptoms , she was advised by her PCP to stop her antibiotics and daughter believes she was prescribed Augmentin - possibly took it for 10 days. She was also advised to stop her nintenadib.  Patient will be restarted on nintenadib tomorrow.  She is fearful this may cause recurrent constipation.  Patient reports that she has felt much better after finishing her antibiotics. She is having a BM every 2 days, no diarrhea. Has minimal discomfort in her lower abdomen.  The patient denies having any nausea, vomiting, fever, chills, hematochezia, melena,  hematemesis, diarrhea, jaundice, pruritus. Lost some weight after episode of diverticulitis.  Does not take any NSAIDs or high dose aspirin.  Last EXB:MWUXL Last Colonoscopy:never  FHx: neg for any gastrointestinal/liver disease, brother pancreatic cancer, brother esophagus cancer Social: neg smoking, alcohol  or illicit drug use Surgical: no abdominal surgeries  Past Medical History: Past Medical History:  Diagnosis Date   Arthritis    Depression    Dyspnea    Family history of pancreatic cancer    Hypertension    SI (sacroiliac) joint dysfunction    Spondylolisthesis    Wears glasses    Wears partial dentures     Past Surgical History: Past Surgical History:  Procedure Laterality Date   ABDOMINAL HYSTERECTOMY     BACK SURGERY  05/28/2018   PLIF   CATARACT EXTRACTION W/PHACO Left 10/06/2014   Procedure: CATARACT EXTRACTION PHACO AND INTRAOCULAR LENS PLACEMENT LEFT EYE;  Surgeon: Anner Kill, MD;  Location: [facility];  Service: Ophthalmology;  Laterality: Left;  CDE:5.49   CATARACT EXTRACTION W/PHACO Right 10/17/2014   Procedure: CATARACT EXTRACTION PHACO AND INTRAOCULAR LENS PLACEMENT RIGHT EYE CDE=9.81;  Surgeon: Anner Kill, MD;  Location: [facility];  Service: Ophthalmology;  Laterality: Right;   MULTIPLE TOOTH EXTRACTIONS     ROTATOR CUFF REPAIR     right shoulder   SACROILIAC JOINT FUSION Right 11/18/2019   Procedure: RIGHT SACROILIAC JOINT FUSION;  Surgeon: Virl Grimes, MD;  Location: [facility];  Service: Orthopedics;  Laterality: Right;   SHOULDER ARTHROSCOPY WITH SUBACROMIAL DECOMPRESSION, ROTATOR CUFF REPAIR AND BICEP TENDON REPAIR Left 09/17/2018   Procedure: LEFT SHOULDER ARTHROSCOPY WITH DEBRIDEMENT, SUBACROMIAL DECOMPRESSION, ROTATOR CUFF;  Surgeon: Micheline Ahr, MD;  Location: [facility];  Service: Orthopedics;  Laterality: Left;   TRANSFORAMINAL LUMBAR INTERBODY FUSION (TLIF) WITH PEDICLE SCREW FIXATION 1 LEVEL Right 08/09/2021   Procedure: RIGHT-SIDED  LUMBAR 3 - LUMBAR 4 TRANSFORAMINAL LUMBAR INTERBODY FUSION WITH INSTRUMENTATION AND ALLOGRAFT;  Surgeon: Virl Grimes, MD;  Location: [facility];  Service: Orthopedics;  Laterality: Right;   TUBAL LIGATION      Family History: Family History  Problem Relation Age of Onset   Pulmonary fibrosis Mother 17   Aneurysm Father 41   Diabetes Sister    Hypertension Sister    Pulmonary fibrosis Sister 81   Heart attack Brother        d. 58s   Pulmonary fibrosis Brother 24   Pulmonary fibrosis Brother    Esophageal cancer Brother    Pancreatic cancer Brother    COPD Maternal Aunt    Stroke Paternal Aunt    Pneumonia Maternal Grandmother    Kidney disease Maternal Grandfather    Stroke Paternal Grandmother    Pulmonary fibrosis Cousin        mat first cousin    Social History: Social History   Tobacco Use  Smoking Status Never  Smokeless Tobacco Never   Social History   Substance and Sexual Activity  Alcohol  Use No   Social History   Substance and Sexual Activity  Drug Use No    Allergies: No Known Allergies  Medications: Current Outpatient Medications  Medication Sig Dispense Refill   busPIRone (BUSPAR) 5 MG tablet Take 5 mg by mouth 2 (two) times daily.     gabapentin (NEURONTIN) 300 MG capsule Take 300 mg by mouth 2 (two) times daily.     Homeopathic Products (PROSACEA) GEL Apply 1 application topically daily as needed (rosacea).     meloxicam  (MOBIC ) 7.5 MG tablet Take 7.5 mg by mouth daily.     traMADol (ULTRAM) 50 MG tablet Take 50 mg by mouth 2 (two) times daily as needed.     triamterene -hydrochlorothiazide  (DYAZIDE ) 37.5-25 MG capsule Take 1 tablet by mouth daily.      OFEV 150 MG CAPS TAKE 1 CAPSULE BY MOUTH TWICE DAILY (12 HOURS APART) WITH FOOD (Patient not taking: Reported on 02/23/2024) 180 capsule 1   No current facility-administered medications for this visit.    Review of Systems: GENERAL: negative for malaise, night sweats HEENT: No changes in hearing  or vision, no nose bleeds or other nasal problems. NECK: Negative for lumps, goiter, pain and significant neck swelling RESPIRATORY: Negative for cough, wheezing CARDIOVASCULAR: Negative for chest pain, leg swelling, palpitations, orthopnea GI: SEE HPI MUSCULOSKELETAL: Negative for joint pain or swelling, back pain, and muscle pain. SKIN: Negative for lesions, rash PSYCH: Negative for sleep disturbance, mood disorder and recent psychosocial stressors. HEMATOLOGY Negative for prolonged bleeding, bruising easily, and swollen nodes. ENDOCRINE: Negative for cold or heat intolerance, polyuria, polydipsia and goiter. NEURO: negative for tremor, gait imbalance, syncope and seizures. The remainder of the review of systems is noncontributory.   Physical Exam: BP (!) 149/92 (BP Location: Left Arm, Patient Position: Sitting, Cuff Size: Large)   Pulse 81   Temp 97.8 F (36.6 C) (Temporal)   Ht 5' 2 (1.575 m)   Wt 166 lb 9.6 oz (75.6 kg)   BMI 30.47 kg/m  GENERAL: The patient is AO x3, in no acute distress. HEENT: Head is normocephalic and atraumatic. EOMI are intact. Mouth is well hydrated and without lesions. NECK: Supple. No masses LUNGS: Clear to auscultation. No presence of rhonchi/wheezing/rales. Adequate  chest expansion HEART: RRR, normal s1 and s2. ABDOMEN: Soft, nontender, no guarding, no peritoneal signs, and nondistended. BS +. No masses. EXTREMITIES: Without any cyanosis, clubbing, rash, lesions or edema. NEUROLOGIC: AOx3, no focal motor deficit. SKIN: no jaundice, no rashes   Imaging/Labs: as above  I personally reviewed and interpreted the available labs, imaging and endoscopic files.  Impression and Plan: Kristin Bush is a 73 y.o. female with past medical history of depression, hypertension, interstitial lung disease, arthritis, who presents for evaluation of diverticulitis.  Patient had 1 isolated episode of diverticulitis that has resolved after receiving antibiotic  course.  Denies any complaints or any active symptoms at the moment.  We discussed that it is possible she may have developed diverticulitis as an isolated episode but as she has never had a colonoscopy, we will need to rule out malignancy as a potential etiology for this.  She is in agreement to proceed with this 8 weeks after her episode of diverticulitis.  She was advised to avoid NSAIDs in the future.  Notably, she has also presented some paradoxical constipation with the use of nintedanib.  As she will be restarted on this medication, I advised her to start taking MiraLAX on a regular basis to avoid recurrent constipation episodes.  -Schedule colonoscopy -After starting  nintenadib tomorrow, start taking Miralax 1 capful every day for one week. If bowel movements do not improve, increase to 2 capfuls every day. If after two weeks there is no improvement, increase to 2 capfuls in AM and one at night.  All questions were answered.      Kristin Cress, MD Gastroenterology and Hepatology [facility]

## 2024-03-01 ENCOUNTER — Other Ambulatory Visit

## 2024-03-01 NOTE — Discharge Instructions (Signed)

## 2024-03-02 ENCOUNTER — Ambulatory Visit
Admission: RE | Admit: 2024-03-02 | Discharge: 2024-03-02 | Disposition: A | Discharge status: Home / Self Care | Source: Ambulatory Visit | Attending: Orthopedic Surgery | Admitting: Orthopedic Surgery

## 2024-03-02 DIAGNOSIS — M5416 Radiculopathy, lumbar region: Secondary | ICD-10-CM

## 2024-03-02 DIAGNOSIS — M4727 Other spondylosis with radiculopathy, lumbosacral region: Secondary | ICD-10-CM | POA: Diagnosis not present

## 2024-03-02 DIAGNOSIS — Z981 Arthrodesis status: Secondary | ICD-10-CM | POA: Diagnosis not present

## 2024-03-02 MED ORDER — IOPAMIDOL (ISOVUE-M 200) INJECTION 41%
1.0000 mL | Freq: Once | INTRAMUSCULAR | Status: AC
  Administered 2024-03-02: 1 mL via EPIDURAL

## 2024-03-02 MED ORDER — METHYLPREDNISOLONE ACETATE 40 MG/ML INJ SUSP (RADIOLOG
80.0000 mg | Freq: Once | INTRAMUSCULAR | Status: AC
  Administered 2024-03-02: 80 mg via EPIDURAL

## 2024-03-19 ENCOUNTER — Other Ambulatory Visit (HOSPITAL_COMMUNITY)
Admission: RE | Admit: 2024-03-19 | Discharge: 2024-03-19 | Disposition: A | Discharge status: Home / Self Care | Source: Ambulatory Visit | Attending: Gastroenterology | Admitting: Gastroenterology

## 2024-03-19 DIAGNOSIS — K5732 Diverticulitis of large intestine without perforation or abscess without bleeding: Secondary | ICD-10-CM | POA: Diagnosis not present

## 2024-03-19 LAB — BASIC METABOLIC PANEL WITH GFR
Anion gap: 12 (ref 5–15)
BUN: 23 mg/dL (ref 8–23)
CO2: 27 mmol/L (ref 22–32)
Calcium: 9.5 mg/dL (ref 8.9–10.3)
Chloride: 100 mmol/L (ref 98–111)
Creatinine, Ser: 0.79 mg/dL (ref 0.44–1.00)
GFR, Estimated: >60 mL/min (ref >60)
Glucose, Bld: 104 mg/dL — ABNORMAL HIGH (ref 70–99)
Potassium: 3.4 mmol/L — ABNORMAL LOW (ref 3.5–5.1)
Sodium: 139 mmol/L (ref 135–145)

## 2024-03-24 ENCOUNTER — Ambulatory Visit (HOSPITAL_COMMUNITY)
Admission: RE | Admit: 2024-03-24 | Discharge: 2024-03-24 | Disposition: A | Discharge status: Home / Self Care | Attending: Gastroenterology | Admitting: Gastroenterology

## 2024-03-24 ENCOUNTER — Ambulatory Visit (HOSPITAL_COMMUNITY): Admitting: Anesthesiology

## 2024-03-24 ENCOUNTER — Encounter (HOSPITAL_COMMUNITY)
Admission: RE | Disposition: A | Discharge status: Home / Self Care | Payer: Self-pay | Source: Home / Self Care | Attending: Gastroenterology

## 2024-03-24 ENCOUNTER — Other Ambulatory Visit: Payer: Self-pay

## 2024-03-24 ENCOUNTER — Encounter (HOSPITAL_COMMUNITY): Payer: Self-pay | Admitting: Gastroenterology

## 2024-03-24 DIAGNOSIS — F32A Depression, unspecified: Secondary | ICD-10-CM | POA: Diagnosis not present

## 2024-03-24 DIAGNOSIS — Z79899 Other long term (current) drug therapy: Secondary | ICD-10-CM | POA: Diagnosis not present

## 2024-03-24 DIAGNOSIS — K5730 Diverticulosis of large intestine without perforation or abscess without bleeding: Secondary | ICD-10-CM

## 2024-03-24 DIAGNOSIS — I10 Essential (primary) hypertension: Secondary | ICD-10-CM | POA: Insufficient documentation

## 2024-03-24 DIAGNOSIS — K648 Other hemorrhoids: Secondary | ICD-10-CM | POA: Diagnosis not present

## 2024-03-24 DIAGNOSIS — J8410 Pulmonary fibrosis, unspecified: Secondary | ICD-10-CM | POA: Diagnosis not present

## 2024-03-24 DIAGNOSIS — Z09 Encounter for follow-up examination after completed treatment for conditions other than malignant neoplasm: Secondary | ICD-10-CM | POA: Diagnosis not present

## 2024-03-24 DIAGNOSIS — M1990 Unspecified osteoarthritis, unspecified site: Secondary | ICD-10-CM | POA: Diagnosis not present

## 2024-03-24 DIAGNOSIS — K5732 Diverticulitis of large intestine without perforation or abscess without bleeding: Secondary | ICD-10-CM

## 2024-03-24 DIAGNOSIS — Z8719 Personal history of other diseases of the digestive system: Secondary | ICD-10-CM | POA: Insufficient documentation

## 2024-03-24 HISTORY — PX: COLONOSCOPY: SHX5424

## 2024-03-24 SURGERY — COLONOSCOPY
Anesthesia: General

## 2024-03-24 MED ORDER — PHENYLEPHRINE 80 MCG/ML (10ML) SYRINGE FOR IV PUSH (FOR BLOOD PRESSURE SUPPORT)
PREFILLED_SYRINGE | INTRAVENOUS | Status: DC | PRN
  Administered 2024-03-24 (×2): 120 ug via INTRAVENOUS

## 2024-03-24 MED ORDER — PROPOFOL 10 MG/ML IV BOLUS
INTRAVENOUS | Status: DC | PRN
  Administered 2024-03-24 (×2): 50 mg via INTRAVENOUS

## 2024-03-24 MED ORDER — LIDOCAINE 2% (20 MG/ML) 5 ML SYRINGE
INTRAMUSCULAR | Status: DC | PRN
  Administered 2024-03-24: 50 mg via INTRAVENOUS

## 2024-03-24 MED ORDER — PROPOFOL 500 MG/50ML IV EMUL
INTRAVENOUS | Status: DC | PRN
  Administered 2024-03-24: 150 ug/kg/min via INTRAVENOUS

## 2024-03-24 MED ORDER — PHENYLEPHRINE 80 MCG/ML (10ML) SYRINGE FOR IV PUSH (FOR BLOOD PRESSURE SUPPORT)
PREFILLED_SYRINGE | INTRAVENOUS | Status: AC
  Filled 2024-03-24: qty 10

## 2024-03-24 MED ORDER — LACTATED RINGERS IV SOLN: INTRAVENOUS | Status: DC

## 2024-03-24 NOTE — Op Note (Signed)
 [facility] Patient Name: Kristin Bush Procedure Date: 03/24/2024 8:47 AM MRN: [id number] Date of Birth: 1951/01/15 Attending MD: Toribio Fortune , , [id number] CSN: [id number] Age: 73 Admit Type: Outpatient Procedure:                Colonoscopy Indications:              Follow-up of diverticulitis Providers:                Toribio Fortune, Leandrew Edelman RN, RN, [place] Wilson,                            Technician Referring MD:              Medicines:                Monitored Anesthesia Care Complications:            No immediate complications. Estimated Blood Loss:     Estimated blood loss: none. Procedure:                Pre-Anesthesia Assessment:                           - Prior to the procedure, a History and Physical                            was performed, and patient medications, allergies                            and sensitivities were reviewed. The patient's                            tolerance of previous anesthesia was reviewed.                           - The risks and benefits of the procedure and the                            sedation options and risks were discussed with the                            patient. All questions were answered and informed                            consent was obtained.                           - ASA Grade Assessment: II - A patient with mild                            systemic disease.                           After obtaining informed consent, the colonoscope                            was passed under direct vision. Throughout the  procedure, the patient's blood pressure, pulse, and                            oxygen saturations were monitored continuously. The                            [id number]-HQ190L ([id number]) scope was introduced through                            the anus and advanced to the the cecum, identified                            by appendiceal orifice and ileocecal valve. The                             colonoscopy was performed without difficulty. The                            patient tolerated the procedure well. The quality                            of the bowel preparation was good. Scope In: 9:02:08 AM Scope Out: 9:21:35 AM Scope Withdrawal Time: 0 hours 12 minutes 10 seconds  Total Procedure Duration: 0 hours 19 minutes 27 seconds  Findings:      The perianal and digital rectal examinations were normal.      Scattered medium-mouthed and small-mouthed diverticula were found in the       sigmoid colon and descending colon.      Non-bleeding internal hemorrhoids were found during retroflexion. The       hemorrhoids were small. Impression:               - Diverticulosis in the sigmoid colon and in the                            descending colon.                           - Non-bleeding internal hemorrhoids.                           - No specimens collected. Moderate Sedation:      Per Anesthesia Care Recommendation:           - Discharge patient to home (ambulatory).                           - Resume previous diet.                           - Repeat colonoscopy is not recommended due to                            current age (33 years or older) for screening                            purposes.  Procedure Code(s):        --- Professional ---                           [phone], Colonoscopy, flexible; diagnostic, including                            collection of specimen(s) by brushing or washing,                            when performed (separate procedure) Diagnosis Code(s):        --- Professional ---                           K64.8, Other hemorrhoids                           K57.32, Diverticulitis of large intestine without                            perforation or abscess without bleeding                           K57.30, Diverticulosis of large intestine without                            perforation or abscess without bleeding CPT copyright 2022 American  Medical Association. All rights reserved. The codes documented in this report are preliminary and upon coder review may  be revised to meet current compliance requirements. Toribio Fortune, MD Toribio Fortune,  03/24/2024 9:27:09 AM This report has been signed electronically. Number of Addenda: 0

## 2024-03-24 NOTE — Anesthesia Postprocedure Evaluation (Signed)
 Anesthesia Post Note  Patient: Kristin Bush  Procedure(s) Performed: COLONOSCOPY  Patient location during evaluation: Endoscopy Anesthesia Type: General Level of consciousness: awake and alert Pain management: pain level controlled Vital Signs Assessment: post-procedure vital signs reviewed and stable Respiratory status: spontaneous breathing, nonlabored ventilation and respiratory function stable Cardiovascular status: blood pressure returned to baseline and stable Postop Assessment: no apparent nausea or vomiting Anesthetic complications: no   There were no known notable events for this encounter.   Last Vitals:  Vitals:   03/24/24 0926 03/24/24 0934  BP: (!) 97/45 116/65  Pulse: 82 86  Resp: 13 19  Temp:  36.6 C  SpO2: 96% 96%    Last Pain:  Vitals:   03/24/24 0934  TempSrc: Oral  PainSc: 0-No pain                 Abimael Zeiter L Samson Ralph

## 2024-03-24 NOTE — Transfer of Care (Signed)
 Immediate Anesthesia Transfer of Care Note  Patient: Kristin Bush  Procedure(s) Performed: COLONOSCOPY  Patient Location: Short Stay  Anesthesia Type:MAC  Level of Consciousness: drowsy  Airway & Oxygen Therapy: Patient Spontanous Breathing  Post-op Assessment: Report given to RN and Post -op Vital signs reviewed and stable  Post vital signs: Reviewed and stable  Last Vitals:  Vitals Value Taken Time  BP 97/45 03/24/24 09:26  Temp    Pulse 82 03/24/24 09:26  Resp 16 03/24/24 09:26  SpO2 96 % on RA 03/24/24 09:26    Last Pain:  Vitals:   03/24/24 0857  TempSrc:   PainSc: 8       Patients Stated Pain Goal: 6 (03/24/24 0739)  Complications: No notable events documented.

## 2024-03-24 NOTE — Interval H&P Note (Signed)
 History and Physical Interval Note:  03/24/2024 7:39 AM  Kristin Bush  has presented today for surgery, with the diagnosis of diverticulitis.  The various methods of treatment have been discussed with the patient and family. After consideration of risks, benefits and other options for treatment, the patient has consented to  Procedure(s) with comments: COLONOSCOPY (N/A) - 9:00 am, asa 1/2 as a surgical intervention.  The patient's history has been reviewed, patient examined, no change in status, stable for surgery.  I have reviewed the patient's chart and labs.  Questions were answered to the patient's satisfaction.     Salimah Martinovich Castaneda Mayorga

## 2024-03-24 NOTE — Discharge Instructions (Signed)
You are being discharged to home.  Resume your previous diet.  Your physician has indicated that a repeat colonoscopy is not recommended due to your current age (66 years or older) for screening purposes.  

## 2024-03-24 NOTE — Anesthesia Preprocedure Evaluation (Signed)
 Anesthesia Evaluation  Patient identified by MRN, date of birth, ID band Patient awake    Reviewed: Allergy & Precautions, H&P , NPO status , Patient's Chart, lab work & pertinent test results, reviewed documented beta blocker date and time   Airway Mallampati: II  TM Distance: >3 FB Neck ROM: full    Dental no notable dental hx. (+) Dental Advisory Given   Pulmonary shortness of breath Pulmonary fibrosis   Pulmonary exam normal breath sounds clear to auscultation       Cardiovascular Exercise Tolerance: Good hypertension, + DOE  Normal cardiovascular exam Rhythm:regular Rate:Normal     Neuro/Psych  PSYCHIATRIC DISORDERS  Depression     Neuromuscular disease    GI/Hepatic negative GI ROS, Neg liver ROS,,,  Endo/Other  negative endocrine ROS    Renal/GU negative Renal ROS  negative genitourinary   Musculoskeletal  (+) Arthritis , Osteoarthritis,    Abdominal   Peds  Hematology negative hematology ROS (+)   Anesthesia Other Findings   Reproductive/Obstetrics negative OB ROS                              Anesthesia Physical Anesthesia Plan  ASA: 3  Anesthesia Plan: General   Post-op Pain Management: Minimal or no pain anticipated   Induction:   PONV Risk Score and Plan: Propofol  infusion  Airway Management Planned: Natural Airway and Nasal Cannula  Additional Equipment: None  Intra-op Plan:   Post-operative Plan:   Informed Consent: I have reviewed the patients History and Physical, chart, labs and discussed the procedure including the risks, benefits and alternatives for the proposed anesthesia with the patient or authorized representative who has indicated his/her understanding and acceptance.     Dental Advisory Given  Plan Discussed with: CRNA  Anesthesia Plan Comments:         Anesthesia Quick Evaluation

## 2024-03-25 ENCOUNTER — Encounter (HOSPITAL_COMMUNITY): Payer: Self-pay | Admitting: Gastroenterology

## 2024-04-10 ENCOUNTER — Encounter (HOSPITAL_COMMUNITY): Payer: Self-pay

## 2024-04-10 ENCOUNTER — Other Ambulatory Visit: Payer: Self-pay

## 2024-04-10 ENCOUNTER — Emergency Department (HOSPITAL_COMMUNITY)
Admission: EM | Admit: 2024-04-10 | Discharge: 2024-04-10 | Disposition: A | Discharge status: Home / Self Care | Attending: Emergency Medicine | Admitting: Emergency Medicine

## 2024-04-10 ENCOUNTER — Emergency Department (HOSPITAL_COMMUNITY)

## 2024-04-10 DIAGNOSIS — E876 Hypokalemia: Secondary | ICD-10-CM | POA: Insufficient documentation

## 2024-04-10 DIAGNOSIS — D72829 Elevated white blood cell count, unspecified: Secondary | ICD-10-CM | POA: Insufficient documentation

## 2024-04-10 DIAGNOSIS — E860 Dehydration: Secondary | ICD-10-CM | POA: Diagnosis not present

## 2024-04-10 DIAGNOSIS — K529 Noninfective gastroenteritis and colitis, unspecified: Secondary | ICD-10-CM | POA: Diagnosis not present

## 2024-04-10 DIAGNOSIS — R000 Tachycardia, unspecified: Secondary | ICD-10-CM | POA: Diagnosis not present

## 2024-04-10 DIAGNOSIS — R197 Diarrhea, unspecified: Secondary | ICD-10-CM | POA: Diagnosis present

## 2024-04-10 DIAGNOSIS — R1032 Left lower quadrant pain: Secondary | ICD-10-CM | POA: Diagnosis not present

## 2024-04-10 LAB — COMPREHENSIVE METABOLIC PANEL WITH GFR
ALT: 15 U/L (ref 0–44)
AST: 21 U/L (ref 15–41)
Albumin: 3.8 g/dL (ref 3.5–5.0)
Alkaline Phosphatase: 73 U/L (ref 38–126)
Anion gap: 16 — ABNORMAL HIGH (ref 5–15)
BUN: 25 mg/dL — ABNORMAL HIGH (ref 8–23)
CO2: 21 mmol/L — ABNORMAL LOW (ref 22–32)
Calcium: 9.3 mg/dL (ref 8.9–10.3)
Chloride: 96 mmol/L — ABNORMAL LOW (ref 98–111)
Creatinine, Ser: 1.14 mg/dL — ABNORMAL HIGH (ref 0.44–1.00)
GFR, Estimated: 51 mL/min — ABNORMAL LOW (ref >60)
Glucose, Bld: 165 mg/dL — ABNORMAL HIGH (ref 70–99)
Potassium: 3.3 mmol/L — ABNORMAL LOW (ref 3.5–5.1)
Sodium: 133 mmol/L — ABNORMAL LOW (ref 135–145)
Total Bilirubin: 1.2 mg/dL (ref 0.0–1.2)
Total Protein: 7.9 g/dL (ref 6.5–8.1)

## 2024-04-10 LAB — URINALYSIS, ROUTINE W REFLEX MICROSCOPIC
Bilirubin Urine: NEGATIVE
Glucose, UA: NEGATIVE mg/dL
Hgb urine dipstick: NEGATIVE
Ketones, ur: 5 mg/dL — AB
Leukocytes,Ua: NEGATIVE
Nitrite: NEGATIVE
Protein, ur: NEGATIVE mg/dL
Specific Gravity, Urine: 1.015 (ref 1.005–1.030)
pH: 5 (ref 5.0–8.0)

## 2024-04-10 LAB — CBC
HCT: 51.2 % — ABNORMAL HIGH (ref 36.0–46.0)
Hemoglobin: 17.3 g/dL — ABNORMAL HIGH (ref 12.0–15.0)
MCH: 29.6 pg (ref 26.0–34.0)
MCHC: 33.8 g/dL (ref 30.0–36.0)
MCV: 87.5 fL (ref 80.0–100.0)
Platelets: 367 K/uL (ref 150–400)
RBC: 5.85 MIL/uL — ABNORMAL HIGH (ref 3.87–5.11)
RDW: 13.3 % (ref 11.5–15.5)
WBC: 12.6 K/uL — ABNORMAL HIGH (ref 4.0–10.5)
nRBC: 0 % (ref 0.0–0.2)

## 2024-04-10 LAB — LIPASE, BLOOD: Lipase: 25 U/L (ref 11–51)

## 2024-04-10 MED ORDER — POTASSIUM CHLORIDE 10 MEQ/100ML IV SOLN
10.0000 meq | Freq: Once | INTRAVENOUS | Status: AC
  Administered 2024-04-10: 10 meq via INTRAVENOUS
  Filled 2024-04-10: qty 100

## 2024-04-10 MED ORDER — SODIUM CHLORIDE 0.9 % IV BOLUS
1000.0000 mL | Freq: Once | INTRAVENOUS | Status: AC
  Administered 2024-04-10: 1000 mL via INTRAVENOUS

## 2024-04-10 MED ORDER — IOHEXOL 300 MG/ML  SOLN
100.0000 mL | Freq: Once | INTRAMUSCULAR | Status: AC | PRN
  Administered 2024-04-10: 100 mL via INTRAVENOUS

## 2024-04-10 MED ORDER — AMOXICILLIN-POT CLAVULANATE 875-125 MG PO TABS
1.0000 | ORAL_TABLET | Freq: Once | ORAL | Status: AC
  Administered 2024-04-10: 1 via ORAL
  Filled 2024-04-10: qty 1

## 2024-04-10 MED ORDER — SODIUM CHLORIDE 0.9 % IV BOLUS
500.0000 mL | Freq: Once | INTRAVENOUS | Status: AC
  Administered 2024-04-10: 500 mL via INTRAVENOUS

## 2024-04-10 MED ORDER — ONDANSETRON HCL 4 MG/2ML IJ SOLN
4.0000 mg | Freq: Once | INTRAMUSCULAR | Status: AC
  Administered 2024-04-10: 4 mg via INTRAVENOUS
  Filled 2024-04-10: qty 2

## 2024-04-10 MED ORDER — AMOXICILLIN-POT CLAVULANATE 875-125 MG PO TABS: 1.0000 | ORAL_TABLET | Freq: Two times a day (BID) | ORAL | 0 refills | Status: DC

## 2024-04-10 MED ORDER — ONDANSETRON HCL 4 MG PO TABS: 4.0000 mg | ORAL_TABLET | Freq: Three times a day (TID) | ORAL | 0 refills | Status: AC | PRN

## 2024-04-10 MED ORDER — ONDANSETRON 4 MG PO TBDP
4.0000 mg | ORAL_TABLET | Freq: Once | ORAL | Status: AC
  Administered 2024-04-10: 4 mg via ORAL
  Filled 2024-04-10: qty 1

## 2024-04-10 MED ORDER — ONDANSETRON HCL 4 MG PO TABS: 4.0000 mg | ORAL_TABLET | Freq: Four times a day (QID) | ORAL | 0 refills | Status: DC

## 2024-04-10 NOTE — Discharge Instructions (Signed)
 We really need to have a sample of stool if your diarrhea continues.  I have given you the option of coming into the hospital tonight or going home and you have chosen to go home.  I think this is a good choice but I would like for you to take Augmentin  twice a day, Zofran  can be taken every 6 hours as needed for nausea, I want you to come back to the ER for severe or worsening pain or fever or vomiting, the diarrhea will likely continue for a few days but if it is getting worse come back to the hospital.  If you are not able to tolerate fluids come back to the hospital.  Thank you for allowing us  to treat you in the emergency department today.  After reviewing your examination and potential testing that was done it appears that you are safe to go home.  I would like for you to follow-up with your doctor within the next several days, have them obtain your records and follow-up with them to review all potential tests and results from your visit.  If you should develop severe or worsening symptoms return to the emergency department immediately

## 2024-04-10 NOTE — ED Provider Notes (Signed)
 [facility] AT [facility] Provider Note   CSN: [id number] Arrival date & time: 04/10/24  1457     Patient presents with: Diarrhea   Kristin Bush is a 73 y.o. female.    Diarrhea  This patient is a 73 year old female presenting with recurrent diarrhea over the last week.  Had a colonoscopy 2 weeks ago after having diverticulitis in May, I have reviewed the medical record showing the results and CT scan from an outside hospital, she was treated with Augmentin  and improved.  The colonoscopy was unremarkable except for diverticular disease of the large colon.  Diarrhea started about 6 days ago, watery, multiple episodes per day, now has associated generalized weakness nausea and poor appetite.  No fevers, no chills, no back pain, no swelling, no blood in the stool, no travel, no recent antibiotics since the Augmentin  2 months ago    Prior to Admission medications   Medication Sig Start Date End Date Taking? Authorizing Provider  amoxicillin -clavulanate (AUGMENTIN ) 875-125 MG tablet Take 1 tablet by mouth every 12 (twelve) hours. 04/10/24  Yes Cleotilde Rogue, MD  ondansetron  (ZOFRAN ) 4 MG tablet Take 1 tablet (4 mg total) by mouth every 6 (six) hours. 04/10/24  Yes Cleotilde Rogue, MD  ondansetron  (ZOFRAN ) 4 MG tablet Take 1 tablet (4 mg total) by mouth every 8 (eight) hours as needed for up to 1 day for nausea or vomiting. 04/10/24 04/11/24 Yes Cleotilde Rogue, MD  busPIRone (BUSPAR) 5 MG tablet Take 5 mg by mouth 2 (two) times daily. 08/05/23   [provider]  gabapentin (NEURONTIN) 300 MG capsule Take 300 mg by mouth 2 (two) times daily. 05/21/23   [provider]  Homeopathic Products (PROSACEA) GEL Apply 1 application topically daily as needed (rosacea).    [provider]  meloxicam  (MOBIC ) 7.5 MG tablet Take 7.5 mg by mouth daily. 03/22/23   [provider]  OFEV 150 MG CAPS TAKE 1 CAPSULE BY MOUTH TWICE DAILY (12 HOURS APART)  WITH FOOD Patient not taking: No sig reported 02/10/24   Geronimo Amel, MD  traMADol (ULTRAM) 50 MG tablet Take 50 mg by mouth 2 (two) times daily as needed. 05/21/23   [provider]  triamterene -hydrochlorothiazide  (DYAZIDE ) 37.5-25 MG capsule Take 1 tablet by mouth daily.     [provider]    Allergies: Patient has no known allergies.    Review of Systems  Gastrointestinal:  Positive for diarrhea.  All other systems reviewed and are negative.   Updated Vital Signs BP 125/73 (BP Location: Right Arm)   Pulse 89   Temp 98 F (36.7 C)   Resp 16   Ht 1.575 m (5' 2)   Wt 72 kg   SpO2 96%   BMI 29.03 kg/m   Physical Exam Vitals and nursing note reviewed.  Constitutional:      General: She is not in acute distress.    Appearance: She is well-developed.  HENT:     Head: Normocephalic and atraumatic.     Mouth/Throat:     Pharynx: No oropharyngeal exudate.  Eyes:     General: No scleral icterus.       Right eye: No discharge.        Left eye: No discharge.     Conjunctiva/sclera: Conjunctivae normal.     Pupils: Pupils are equal, round, and reactive to light.  Neck:     Thyroid : No thyromegaly.     Vascular: No JVD.  Cardiovascular:  Rate and Rhythm: Regular rhythm. Tachycardia present.     Heart sounds: Normal heart sounds. No murmur heard.    No friction rub. No gallop.  Pulmonary:     Effort: Pulmonary effort is normal. No respiratory distress.     Breath sounds: Normal breath sounds. No wheezing or rales.  Abdominal:     General: Bowel sounds are normal. There is no distension.     Palpations: Abdomen is soft. There is no mass.     Tenderness: There is abdominal tenderness.     Comments: Very mild left lower quadrant tenderness  Musculoskeletal:        General: No tenderness. Normal range of motion.     Cervical back: Normal range of motion and neck supple.     Right lower leg: No edema.     Left lower leg: No edema.  Lymphadenopathy:      Cervical: No cervical adenopathy.  Skin:    General: Skin is warm and dry.     Findings: No erythema or rash.  Neurological:     Mental Status: She is alert.     Coordination: Coordination normal.  Psychiatric:        Behavior: Behavior normal.     (all labs ordered are listed, but only abnormal results are displayed) Labs Reviewed  COMPREHENSIVE METABOLIC PANEL WITH GFR - Abnormal; Notable for the following components:      Result Value   Sodium 133 (*)    Potassium 3.3 (*)    Chloride 96 (*)    CO2 21 (*)    Glucose, Bld 165 (*)    BUN 25 (*)    Creatinine, Ser 1.14 (*)    GFR, Estimated 51 (*)    Anion gap 16 (*)    All other components within normal limits  CBC - Abnormal; Notable for the following components:   WBC 12.6 (*)    RBC 5.85 (*)    Hemoglobin 17.3 (*)    HCT 51.2 (*)    All other components within normal limits  URINALYSIS, ROUTINE W REFLEX MICROSCOPIC - Abnormal; Notable for the following components:   Ketones, ur 5 (*)    All other components within normal limits  GASTROINTESTINAL PANEL BY PCR, STOOL (REPLACES STOOL CULTURE)  C DIFFICILE QUICK SCREEN W PCR REFLEX    LIPASE, BLOOD    EKG: None  Radiology: CT ABDOMEN PELVIS W CONTRAST Result Date: 04/10/2024 CLINICAL DATA:  Left lower quadrant abdominal pain, diarrhea, nausea, weakness EXAM: CT ABDOMEN AND PELVIS WITH CONTRAST TECHNIQUE: Multidetector CT imaging of the abdomen and pelvis was performed using the standard protocol following bolus administration of intravenous contrast. RADIATION DOSE REDUCTION: This exam was performed according to the departmental dose-optimization program which includes automated exposure control, adjustment of the mA and/or kV according to patient size and/or use of iterative reconstruction technique. CONTRAST:  OMNIPAQUE  IOHEXOL  300 MG/ML  SOLN COMPARISON:  None Available. FINDINGS: Lower chest: Bibasilar scarring. No acute pleural or parenchymal lung disease.  Hepatobiliary: No focal liver abnormality is seen. No gallstones, gallbladder wall thickening, or biliary dilatation. Pancreas: Unremarkable. No pancreatic ductal dilatation or surrounding inflammatory changes. Spleen: Normal in size without focal abnormality. Adrenals/Urinary Tract: Adrenal glands are unremarkable. Kidneys are normal, without renal calculi, focal lesion, or hydronephrosis. Bladder is unremarkable. Stomach/Bowel: No bowel obstruction or ileus. There is diffuse colonic wall thickening and pericolonic fat stranding, compatible with inflammatory or infectious colitis. Normal appendix right lower quadrant. Vascular/Lymphatic: Aortic atherosclerosis. No enlarged  abdominal or pelvic lymph nodes. Reproductive: Status post hysterectomy. No adnexal masses. Other: Trace free fluid within the right para colic gutter and pelvis. No free intraperitoneal gas. No abdominal wall hernia. Musculoskeletal: No acute or destructive bony abnormalities. Extensive postsurgical changes within the lower lumbar spine and sacrum. Reconstructed images demonstrate no additional findings. IMPRESSION: 1. Diffuse colonic wall thickening and pericolonic fat stranding, consistent with inflammatory or infectious colitis. 2. Trace free fluid, likely reactive. 3.  Aortic Atherosclerosis (ICD10-I70.0). Electronically Signed   By: Ozell Daring M.D.   On: 04/10/2024 21:26     Procedures   Medications Ordered in the ED  amoxicillin -clavulanate (AUGMENTIN ) 875-125 MG per tablet 1 tablet (has no administration in time range)  sodium chloride  0.9 % bolus 1,000 mL (0 mLs Intravenous Stopped 04/10/24 1718)  ondansetron  (ZOFRAN -ODT) disintegrating tablet 4 mg (4 mg Oral Given 04/10/24 1619)  potassium chloride  10 mEq in 100 mL IVPB (0 mEq Intravenous Stopped 04/10/24 1918)  sodium chloride  0.9 % bolus 500 mL (0 mLs Intravenous Stopped 04/10/24 1850)  ondansetron  (ZOFRAN ) injection 4 mg (4 mg Intravenous Given 04/10/24 2105)  iohexol   (OMNIPAQUE ) 300 MG/ML solution 100 mL (100 mLs Intravenous Contrast Given 04/10/24 2115)                                    Medical Decision Making Amount and/or Complexity of Data Reviewed Labs: ordered. Radiology: ordered.  Risk Prescription drug management.   The patient has no abdominal tenderness, she is mildly tachycardic, I suspect that she has either colitis or would also consider C. difficile given the recent antibiotics and colonoscopy, will need to have labs, hydration, reevaluation, may need CT scan though her pain is minimal it is focused to the left lower quadrant.   Co morbidities / Chronic conditions that complicate the patient evaluation  Recent evaluation including colonoscopy   Additional history obtained:  Additional history obtained from EMR External records from outside source obtained and reviewed including medical record including recent colonoscopy, showed diverticulosis   Lab Tests:  I Ordered, and personally interpreted labs.  The pertinent results include: Unable to give a stool sample but labs were unremarkable overall, mild hypokalemia, mild elevation in creatinine, mild leukocytosis and elevation in hemoglobin consistent with dehydration   Imaging Studies ordered:  I ordered imaging studies including CT scan of the abdomen and pelvis with contrast I independently visualized and interpreted imaging which showed diffuse colonic wall thickening consistent with inflammatory or infectious colitis I agree with the radiologist interpretation   Cardiac Monitoring: / EKG:  The patient was maintained on a cardiac monitor.  I personally viewed and interpreted the cardiac monitored which showed an underlying rhythm of: Normal sinus rhythm, no tachycardia   Problem List / ED Course / Critical interventions / Medication management  I discussed all of the results with the patient and her family member, I let them know that there was signs of colitis, the  treatment would be antibiotics and IV fluids, she is agreeable to going home and states she does not want to be admitted to the hospital but will come back if things get worse.  I think this is reasonable, she was given Augmentin  and Zofran  I ordered medication including Timentin and Zofran  Reevaluation of the patient after these medicines showed that the patient has improved after IV fluids significantly I have reviewed the patients home medicines and have made adjustments as  needed   Social Determinants of Health:  None   Test / Admission - Considered:  Offered admission but the patient declines but will come back if things get worse  I have discussed with the patient at the bedside the results, and the meaning of these results.  They have had opportunity to ask questions,  expressed their understanding to the need for follow-up with primary care physician     Final diagnoses:  Colitis  Dehydration    ED Discharge Orders          Ordered    ondansetron  (ZOFRAN ) 4 MG tablet  Every 6 hours        04/10/24 2207    ondansetron  (ZOFRAN ) 4 MG tablet  Every 8 hours PRN        04/10/24 2207    amoxicillin -clavulanate (AUGMENTIN ) 875-125 MG tablet  Every 12 hours        04/10/24 2207               Cleotilde Rogue, MD 04/10/24 2209

## 2024-04-10 NOTE — ED Triage Notes (Signed)
 Pt complainig of diarrhea x [redacted] week along with nausea and weakness. Pt stated that she recently dealt with diverticulitis and this feels similar.

## 2024-04-22 NOTE — Telephone Encounter (Signed)
 SABRA

## 2024-04-29 ENCOUNTER — Ambulatory Visit (INDEPENDENT_AMBULATORY_CARE_PROVIDER_SITE_OTHER): Admitting: Adult Health

## 2024-04-29 ENCOUNTER — Encounter: Payer: Self-pay | Admitting: Adult Health

## 2024-04-29 VITALS — BP 110/80 | Temp 98.0°F | Ht 62.0 in | Wt 154.0 lb

## 2024-04-29 DIAGNOSIS — J849 Interstitial pulmonary disease, unspecified: Secondary | ICD-10-CM

## 2024-04-29 DIAGNOSIS — K5732 Diverticulitis of large intestine without perforation or abscess without bleeding: Secondary | ICD-10-CM

## 2024-04-29 DIAGNOSIS — K529 Noninfective gastroenteritis and colitis, unspecified: Secondary | ICD-10-CM | POA: Diagnosis not present

## 2024-04-29 DIAGNOSIS — R634 Abnormal weight loss: Secondary | ICD-10-CM | POA: Diagnosis not present

## 2024-04-29 MED ORDER — ONDANSETRON HCL 4 MG PO TABS: 4.0000 mg | ORAL_TABLET | Freq: Three times a day (TID) | ORAL | 1 refills | Status: DC | PRN

## 2024-04-29 NOTE — Progress Notes (Signed)
 @Patient  ID: Kristin Bush, female    DOB: [birth year], 73 y.o.   MRN: [id number]  Chief Complaint  Patient presents with   ILD    Referring provider: Rosamond Leta NOVAK, MD  HPI: 73 yo female followed for ILD-NSIP.  Initial pulmonary consult December 2020 for Strong family history of pulmonary fibrosis- Genetic counselow -PARN mutation  TEST/EVENTS :  CT chest findings in ABD CT April 2022  -Lower chest: Diffuse chronic interstitial coarsening. The visualized  lung bases are otherwise clear. There is coronary vascular  calcification.   CT Chest oct 2024 at [facility] = personally visualized and agree with the findings-  1. Chronic interstitial lung disease with patchy multifocal  ground-glass attenuation, subpleural and peribronchovascular  reticulation and architectural distortion. Findings are most  consistent with chronic fibrotic interstitial lung disease,  potentially NSIP. Consider follow-up high-resolution chest CT in 3-6  months.   04/29/2024 Follow up ; ILD  Discussed the use of AI scribe software for clinical note transcription with the patient, who gave verbal consent to proceed.  History of Present Illness Kristin Bush is a 73 year old female with ILD (NSIP)  who presents with severe gastrointestinal symptoms and weight loss. Since her last visit in June, she has been experiencing severe gastrointestinal symptoms, including episodes of diarrhea and constipation, nausea, and significant weight loss. Two major episodes of diverticulitis required treatment in the emergency room in May and also in August .  She has been seen by gastroenterology and underwent colonoscopy that showed diverticulosis.  She was recommended to use MiraLAX to manage constipation.  Last visit patient was recommended for a drug holiday with her Ofev.  She says as soon as she restarted it she continued to have ongoing daily nausea low appetite and weight has been going down she is down 20 pounds since  earlier this year.  She said during both of these episodes she was severely sick and still has not totally recovered. She is currently taking Ofev twice daily.  She is unable to perform daily activities such as standing at the kitchen sink or attending church without feeling sick. She denies any flare of coughing.  But does get short of breath and has low energy currently Most recent high-resolution CT chest November 10, 2023 showed interstitial groundglass with subpleural reticulation and traction bronchiectasis similar to August 2024 findings are indeterminate for UIP.  Appeared fibrotic nonspecific interstitial pneumonitis    No Known Allergies  Immunization History  Administered Date(s) Administered   Fluad Trivalent(High Dose 65+) 06/11/2023   Td (Adult),5 Lf Tetanus Toxid, Preservative Free 07/07/1998    Past Medical History:  Diagnosis Date   Arthritis    Depression    Dyspnea    Family history of pancreatic cancer    Hypertension    Pulmonary fibrosis (HCC) 05/2023   SI (sacroiliac) joint dysfunction    Spondylolisthesis    Wears glasses    Wears partial dentures     Tobacco History: Social History   Tobacco Use  Smoking Status Never  Smokeless Tobacco Never   Counseling given: Not Answered   Outpatient Medications Prior to Visit  Medication Sig Dispense Refill   busPIRone (BUSPAR) 5 MG tablet Take 5 mg by mouth 2 (two) times daily.     gabapentin (NEURONTIN) 300 MG capsule Take 300 mg by mouth 2 (two) times daily.     Homeopathic Products (PROSACEA) GEL Apply 1 application topically daily as needed (rosacea).     meloxicam  (  MOBIC ) 7.5 MG tablet Take 7.5 mg by mouth daily.     OFEV 150 MG CAPS TAKE 1 CAPSULE BY MOUTH TWICE DAILY (12 HOURS APART) WITH FOOD 180 capsule 1   ondansetron  (ZOFRAN ) 4 MG tablet Take 1 tablet (4 mg total) by mouth every 6 (six) hours. 12 tablet 0   traMADol (ULTRAM) 50 MG tablet Take 50 mg by mouth 2 (two) times daily as needed.      triamterene -hydrochlorothiazide  (DYAZIDE ) 37.5-25 MG capsule Take 1 tablet by mouth daily.      amoxicillin -clavulanate (AUGMENTIN ) 875-125 MG tablet Take 1 tablet by mouth every 12 (twelve) hours. (Patient not taking: Reported on 04/29/2024) 14 tablet 0   No facility-administered medications prior to visit.     Review of Systems:   Constitutional:   +weight loss, no  night sweats,  Fevers, chills, +fatigue, or  lassitude.  HEENT:   No headaches,  Difficulty swallowing,  Tooth/dental problems, or  Sore throat,                No sneezing, itching, ear ache, nasal congestion, post nasal drip,   CV:  No chest pain,  Orthopnea, PND, swelling in lower extremities, anasarca, dizziness, palpitations, syncope.   GI  No  , bloody stools.   Resp:   No wheezing.  No chest wall deformity  Skin: no rash or lesions.  GU: no dysuria, change in color of urine, no urgency or frequency.  No flank pain, no hematuria   MS:  No joint pain or swelling.  No decreased range of motion.  No back pain.    Physical Exam  BP 110/80   Temp 98 F (36.7 C) (Oral)   Ht 5' 2 (1.575 m)   Wt 154 lb (69.9 kg)   SpO2 100% Comment: RA  BMI 28.17 kg/m   GEN: A/Ox3; pleasant , NAD, well nourished    HEENT:  [place]/AT,   , NOSE-clear, THROAT-clear, no lesions, no postnasal drip or exudate noted.   NECK:  Supple w/ fair ROM; no JVD; normal carotid impulses w/o bruits; no thyromegaly or nodules palpated; no lymphadenopathy.    RESP bibasilar crackles  no accessory muscle use, no dullness to percussion  CARD:  RRR, no m/r/g, no peripheral edema, pulses intact, no cyanosis or clubbing.  GI:   Soft & nt; nml bowel sounds; no organomegaly or masses detected. No guarding  Musco: Warm bil, no deformities or joint swelling noted.   Neuro: alert, no focal deficits noted.    Skin: Warm, no lesions or rashes    Lab Results:    BMET     ProBNP No results found for: PROBNP  Imaging: CT ABDOMEN PELVIS W  CONTRAST Result Date: 04/10/2024 CLINICAL DATA:  Left lower quadrant abdominal pain, diarrhea, nausea, weakness EXAM: CT ABDOMEN AND PELVIS WITH CONTRAST TECHNIQUE: Multidetector CT imaging of the abdomen and pelvis was performed using the standard protocol following bolus administration of intravenous contrast. RADIATION DOSE REDUCTION: This exam was performed according to the departmental dose-optimization program which includes automated exposure control, adjustment of the mA and/or kV according to patient size and/or use of iterative reconstruction technique. CONTRAST:  OMNIPAQUE  IOHEXOL  300 MG/ML  SOLN COMPARISON:  None Available. FINDINGS: Lower chest: Bibasilar scarring. No acute pleural or parenchymal lung disease. Hepatobiliary: No focal liver abnormality is seen. No gallstones, gallbladder wall thickening, or biliary dilatation. Pancreas: Unremarkable. No pancreatic ductal dilatation or surrounding inflammatory changes. Spleen: Normal in size without focal abnormality. Adrenals/Urinary Tract:  Adrenal glands are unremarkable. Kidneys are normal, without renal calculi, focal lesion, or hydronephrosis. Bladder is unremarkable. Stomach/Bowel: No bowel obstruction or ileus. There is diffuse colonic wall thickening and pericolonic fat stranding, compatible with inflammatory or infectious colitis. Normal appendix right lower quadrant. Vascular/Lymphatic: Aortic atherosclerosis. No enlarged abdominal or pelvic lymph nodes. Reproductive: Status post hysterectomy. No adnexal masses. Other: Trace free fluid within the right para colic gutter and pelvis. No free intraperitoneal gas. No abdominal wall hernia. Musculoskeletal: No acute or destructive bony abnormalities. Extensive postsurgical changes within the lower lumbar spine and sacrum. Reconstructed images demonstrate no additional findings. IMPRESSION: 1. Diffuse colonic wall thickening and pericolonic fat stranding, consistent with inflammatory or  infectious colitis. 2. Trace free fluid, likely reactive. 3.  Aortic Atherosclerosis (ICD10-I70.0). Electronically Signed   By: Ozell Daring M.D.   On: 04/10/2024 21:26    Administration History     None          Latest Ref Rng & Units 01/30/2024   12:36 PM 10/09/2023   12:51 PM  PFT Results  FVC-Pre L 1.52  1.38   FVC-Predicted Pre % 59  54   FVC-Post L  1.40   FVC-Predicted Post %  55   Pre FEV1/FVC % % 74  68   Post FEV1/FCV % %  77   FEV1-Pre L 1.12  0.94   FEV1-Predicted Pre % 58  48   FEV1-Post L  1.08   DLCO uncorrected ml/min/mmHg 19.11  11.63   DLCO UNC% % 111  68   DLCO corrected ml/min/mmHg  11.40   DLCO COR %Predicted %  66   DLVA Predicted % 105  113   TLC L  2.53   TLC % Predicted %  56   RV % Predicted %  61     No results found for: NITRICOXIDE      Assessment & Plan:   No problem-specific Assessment & Plan notes found for this encounter.   Assessment and Plan Assessment & Plan Nonspecific interstitial pneumonitis (NSIP)   NSIP-unfortunately unable to tolerate Ofev. Ofev is discontinued due to severe gastrointestinal side effects.  Patient continues to have significant GI symptoms.  Will hold on additional medications at this time.   Emphasis is placed on maintaining activity and muscle strength. Stop Ofev immediately. Follow up with a pulmonary function test in two months. Encourage walking and increasing activity as tolerated.  Diverticulitis /Colitis with suspected medication-induced gastrointestinal symptoms (nausea, diarrhea, constipation, abdominal pain)   Severe gastrointestinal symptoms, likely exacerbated by Ofev, include two episodes of diverticulitis and colitis , leading to significant weight loss and weakness. Ofev is stopped to allow gastrointestinal recovery. Dietary modifications and hydration are emphasized. Follow up with gastroenterologist for further evaluation and management. Advance bland diet as tolerated, starting with the  BRAT diet. Encourage hydration and use of electrolyte replacement drinks. Prescribe Zofran  for nausea.  Unintentional weight loss and generalized weakness   Significant weight loss of over 20 pounds since January is likely due to gastrointestinal side effects of Ofev. Generalized weakness impacts daily activities. The plan addresses underlying gastrointestinal issues and improves nutritional intake to regain strength. Stop Ofev immediately. Encourage a bland diet with small, frequent meals. Encourage a gradual increase in physical activity as tolerated.  Plan  Patient Instructions  Stop OFEV.  Set up follow up with GI as discussed  Advance bland diet as tolerated.  Push fluids  Zofran  refill sent  Follow up with Dr. Geronimo in 2 months  with PFT (Spirometry with DLCO)        Follow-Up   Reassess in two months to evaluate recovery from gastrointestinal symptoms and reassess lung function. Perform a pulmonary function test at follow-up.   I spent  43  minutes dedicated to the care of this patient on the date of this encounter to include pre-visit review of records, face-to-face time with the patient discussing conditions above, post visit ordering of testing, clinical documentation with the electronic health record, making appropriate referrals as documented, and communicating necessary findings to members of the patients care team.   Madelin Stank, NP 04/29/2024

## 2024-04-29 NOTE — Patient Instructions (Addendum)
 Stop OFEV.  Set up follow up with GI as discussed  Advance bland diet as tolerated.  Push fluids  Zofran  refill sent  Follow up with Dr. Geronimo in 2 months with PFT (Spirometry with DLCO)

## 2024-05-11 ENCOUNTER — Other Ambulatory Visit: Payer: Self-pay | Admitting: Orthopedic Surgery

## 2024-05-11 DIAGNOSIS — M545 Low back pain, unspecified: Secondary | ICD-10-CM

## 2024-05-24 ENCOUNTER — Encounter (INDEPENDENT_AMBULATORY_CARE_PROVIDER_SITE_OTHER): Payer: Self-pay | Admitting: Gastroenterology

## 2024-05-24 ENCOUNTER — Ambulatory Visit (INDEPENDENT_AMBULATORY_CARE_PROVIDER_SITE_OTHER): Admitting: Gastroenterology

## 2024-05-24 VITALS — BP 153/80 | HR 97 | Temp 97.8°F | Ht 62.0 in | Wt 156.3 lb

## 2024-05-24 DIAGNOSIS — K529 Noninfective gastroenteritis and colitis, unspecified: Secondary | ICD-10-CM

## 2024-05-24 NOTE — Progress Notes (Signed)
 Kristin Bush, M.D. Gastroenterology & Hepatology [facility] [address] [place], [place] [address]  Primary Care Physician: Kristin Leta NOVAK, MD [address] [place] [address]  I will communicate my assessment and recommendations to the referring MD via EMR.  Problems: Nintedanib induced colitis and diarrhea Recurrent diverticulitis  History of Present Illness: Kristin Bush is a 73 y.o. female with past medical history of depression, hypertension, interstitial lung disease, arthritis, who presents for evaluation of recurrent colitis/diverticulitis.   The patient was last seen on 02/23/2024. At that time, she was scheduled for colonoscopy with findings described below.  Was also advised to take MiraLAX regularly for constipation, possibly associated with the use of nintedanib.  Colonoscopy was performed 03/24/2024 which showed presence of diverticulosis in the sigmoid and descending colon, internal hemorrhoids.  Notably, the patient went to the ER again on 04/10/2024 after presenting recurrent diarrhea for a week.  CT of the abdomen and pelvis with IV contrast showed diffuse colonic wall thickening.  Patient was discharged on Augmentin  course.  Patient followed with the pulmonary clinic, who discontinued her nintedanib due to ongoing issues with diarrhea and colitis, as well as weight loss.  States that after she stopped taking nintenadib, her bowel movements significantly improved. States that she is currently feeling much better. States she started having more back pain issues due to her sciatica, so she has been taking some pain medications which have caused some constipation. May take Miraalx twice a week to help with this.   The patient denies having any nausea, vomiting, fever, chills, hematochezia, melena, hematemesis, abdominal distention, abdominal pain, diarrhea, jaundice, pruritus. Has gained 3 lb recently as she has been able to  eat more food.  Last EGD: Never Last Colonoscopy: As above  Past Medical History: Past Medical History:  Diagnosis Date   Arthritis    Depression    Dyspnea    Family history of pancreatic cancer    Hypertension    Pulmonary fibrosis (HCC) 05/2023   SI (sacroiliac) joint dysfunction    Spondylolisthesis    Wears glasses    Wears partial dentures     Past Surgical History: Past Surgical History:  Procedure Laterality Date   ABDOMINAL HYSTERECTOMY     BACK SURGERY  05/28/2018   PLIF   CATARACT EXTRACTION W/PHACO Left 10/06/2014   Procedure: CATARACT EXTRACTION PHACO AND INTRAOCULAR LENS PLACEMENT LEFT EYE;  Surgeon: Cherene Mania, MD;  Location: [facility];  Service: Ophthalmology;  Laterality: Left;  CDE:5.49   CATARACT EXTRACTION W/PHACO Right 10/17/2014   Procedure: CATARACT EXTRACTION PHACO AND INTRAOCULAR LENS PLACEMENT RIGHT EYE CDE=9.81;  Surgeon: Cherene Mania, MD;  Location: [facility];  Service: Ophthalmology;  Laterality: Right;   COLONOSCOPY N/A 03/24/2024   Procedure: COLONOSCOPY;  Surgeon: Bush Angelia Toribio, MD;  Location: [facility];  Service: Gastroenterology;  Laterality: N/A;  9:00 am, asa 1/2   MULTIPLE TOOTH EXTRACTIONS     ROTATOR CUFF REPAIR     right shoulder   SACROILIAC JOINT FUSION Right 11/18/2019   Procedure: RIGHT SACROILIAC JOINT FUSION;  Surgeon: Beuford Anes, MD;  Location: [facility];  Service: Orthopedics;  Laterality: Right;   SHOULDER ARTHROSCOPY WITH SUBACROMIAL DECOMPRESSION, ROTATOR CUFF REPAIR AND BICEP TENDON REPAIR Left 09/17/2018   Procedure: LEFT SHOULDER ARTHROSCOPY WITH DEBRIDEMENT, SUBACROMIAL DECOMPRESSION, ROTATOR CUFF;  Surgeon: Cristy Bonner DASEN, MD;  Location: [facility];  Service: Orthopedics;  Laterality: Left;   TRANSFORAMINAL LUMBAR INTERBODY FUSION (TLIF) WITH  PEDICLE SCREW FIXATION 1 LEVEL Right 08/09/2021   Procedure: RIGHT-SIDED LUMBAR 3 - LUMBAR 4 TRANSFORAMINAL LUMBAR INTERBODY FUSION WITH INSTRUMENTATION AND  ALLOGRAFT;  Surgeon: Beuford Anes, MD;  Location: [facility];  Service: Orthopedics;  Laterality: Right;   TUBAL LIGATION      Family History: Family History  Problem Relation Age of Onset   Pulmonary fibrosis Mother 43   Aneurysm Father 46   Diabetes Sister    Hypertension Sister    Pulmonary fibrosis Sister 59   Heart attack Brother        d. 57s   Pulmonary fibrosis Brother 69   Pulmonary fibrosis Brother    Esophageal cancer Brother    Pancreatic cancer Brother    COPD Maternal Aunt    Stroke Paternal Aunt    Pneumonia Maternal Grandmother    Kidney disease Maternal Grandfather    Stroke Paternal Grandmother    Pulmonary fibrosis Cousin        mat first cousin    Social History: Social History   Tobacco Use  Smoking Status Never  Smokeless Tobacco Never   Social History   Substance and Sexual Activity  Alcohol  Use No   Social History   Substance and Sexual Activity  Drug Use No    Allergies: No Known Allergies  Medications: Current Outpatient Medications  Medication Sig Dispense Refill   busPIRone (BUSPAR) 5 MG tablet Take 5 mg by mouth 2 (two) times daily.     gabapentin (NEURONTIN) 300 MG capsule Take 300 mg by mouth 2 (two) times daily.     Homeopathic Products (PROSACEA) GEL Apply 1 application topically daily as needed (rosacea).     meloxicam  (MOBIC ) 7.5 MG tablet Take 7.5 mg by mouth daily.     ondansetron  (ZOFRAN ) 4 MG tablet Take 1 tablet (4 mg total) by mouth every 6 (six) hours. 12 tablet 0   traMADol (ULTRAM) 50 MG tablet Take 50 mg by mouth 2 (two) times daily as needed.     triamterene -hydrochlorothiazide  (DYAZIDE ) 37.5-25 MG capsule Take 1 tablet by mouth daily.      OFEV 150 MG CAPS TAKE 1 CAPSULE BY MOUTH TWICE DAILY (12 HOURS APART) WITH FOOD (Patient not taking: Reported on 05/24/2024) 180 capsule 1   No current facility-administered medications for this visit.    Review of Systems: GENERAL: negative for malaise, night sweats HEENT:  No changes in hearing or vision, no nose bleeds or other nasal problems. NECK: Negative for lumps, goiter, pain and significant neck swelling RESPIRATORY: Negative for cough, wheezing CARDIOVASCULAR: Negative for chest pain, leg swelling, palpitations, orthopnea GI: SEE HPI MUSCULOSKELETAL: Negative for joint pain or swelling, back pain, and muscle pain. SKIN: Negative for lesions, rash PSYCH: Negative for sleep disturbance, mood disorder and recent psychosocial stressors. HEMATOLOGY Negative for prolonged bleeding, bruising easily, and swollen nodes. ENDOCRINE: Negative for cold or heat intolerance, polyuria, polydipsia and goiter. NEURO: negative for tremor, gait imbalance, syncope and seizures. The remainder of the review of systems is noncontributory.   Physical Exam: BP (!) 153/80 (BP Location: Left Arm, Patient Position: Sitting, Cuff Size: Large)   Pulse 97   Temp 97.8 F (36.6 C) (Temporal)   Ht 5' 2 (1.575 m)   Wt 156 lb 4.8 oz (70.9 kg)   BMI 28.59 kg/m  GENERAL: The patient is AO x3, in no acute distress. HEENT: Head is normocephalic and atraumatic. EOMI are intact. Mouth is well hydrated and without lesions. NECK: Supple. No masses LUNGS: Clear to  auscultation. No presence of rhonchi/wheezing/rales. Adequate chest expansion HEART: RRR, normal s1 and s2. ABDOMEN: Soft, nontender, no guarding, no peritoneal signs, and nondistended. BS +. No masses. EXTREMITIES: Without any cyanosis, clubbing, rash, lesions or edema. NEUROLOGIC: AOx3, no focal motor deficit. SKIN: no jaundice, no rashes  Imaging/Labs: as above  I personally reviewed and interpreted the available labs, imaging and endoscopic files.  Impression and Plan: TALEIA SADOWSKI is a 73 y.o. female with past medical history of depression, hypertension, interstitial lung disease, arthritis, who presents for evaluation of recurrent colitis/diverticulitis.  Patient has presented recurrent issues with colitis and  diverticulitis after starting nintedanib. This medication is known to cause significant GI side effects, which I believe is the culprit of her presentation. At this point, she should avoid taking this medicine. Reassuringly, her last colonoscopy was unremarkable.  -Advance diet as tolerated -Do not restart Nintedanib (Ofev)  All questions were answered.      Kristin Fortune, MD Gastroenterology and Hepatology [facility]

## 2024-05-24 NOTE — Patient Instructions (Addendum)
 Advance diet as tolerated Do not restart Nintedanib (Ofev)

## 2024-05-30 ENCOUNTER — Ambulatory Visit: Payer: Self-pay | Admitting: Internal Medicine

## 2024-05-31 NOTE — Discharge Instructions (Signed)

## 2024-06-01 ENCOUNTER — Ambulatory Visit
Admission: RE | Admit: 2024-06-01 | Discharge: 2024-06-01 | Disposition: A | Discharge status: Home / Self Care | Source: Ambulatory Visit | Attending: Orthopedic Surgery | Admitting: Orthopedic Surgery

## 2024-06-01 DIAGNOSIS — M47817 Spondylosis without myelopathy or radiculopathy, lumbosacral region: Secondary | ICD-10-CM | POA: Diagnosis not present

## 2024-06-01 DIAGNOSIS — M545 Low back pain, unspecified: Secondary | ICD-10-CM

## 2024-06-01 MED ORDER — IOPAMIDOL (ISOVUE-M 200) INJECTION 41%
1.0000 mL | Freq: Once | INTRAMUSCULAR | Status: AC
Start: 2024-06-01 — End: 2024-06-01
  Administered 2024-06-01: 1 mL via EPIDURAL

## 2024-06-01 MED ORDER — METHYLPREDNISOLONE ACETATE 40 MG/ML INJ SUSP (RADIOLOG
80.0000 mg | Freq: Once | INTRAMUSCULAR | Status: AC
  Administered 2024-06-01: 80 mg via EPIDURAL

## 2024-06-09 DIAGNOSIS — R52 Pain, unspecified: Secondary | ICD-10-CM | POA: Diagnosis not present

## 2024-06-09 DIAGNOSIS — I700 Atherosclerosis of aorta: Secondary | ICD-10-CM | POA: Diagnosis not present

## 2024-06-09 DIAGNOSIS — J849 Interstitial pulmonary disease, unspecified: Secondary | ICD-10-CM | POA: Diagnosis not present

## 2024-06-09 DIAGNOSIS — Z299 Encounter for prophylactic measures, unspecified: Secondary | ICD-10-CM | POA: Diagnosis not present

## 2024-06-09 DIAGNOSIS — M545 Low back pain, unspecified: Secondary | ICD-10-CM | POA: Diagnosis not present

## 2024-06-09 DIAGNOSIS — I10 Essential (primary) hypertension: Secondary | ICD-10-CM | POA: Diagnosis not present

## 2024-06-16 DIAGNOSIS — Z23 Encounter for immunization: Secondary | ICD-10-CM | POA: Diagnosis not present

## 2024-06-17 ENCOUNTER — Encounter (HOSPITAL_COMMUNITY): Payer: Self-pay

## 2024-06-17 ENCOUNTER — Emergency Department (HOSPITAL_COMMUNITY)

## 2024-06-17 ENCOUNTER — Emergency Department (HOSPITAL_COMMUNITY)
Admission: EM | Admit: 2024-06-17 | Discharge: 2024-06-17 | Disposition: A | Discharge status: Home / Self Care | Attending: Emergency Medicine | Admitting: Emergency Medicine

## 2024-06-17 ENCOUNTER — Other Ambulatory Visit: Payer: Self-pay

## 2024-06-17 DIAGNOSIS — S299XXA Unspecified injury of thorax, initial encounter: Secondary | ICD-10-CM | POA: Diagnosis not present

## 2024-06-17 DIAGNOSIS — S3992XA Unspecified injury of lower back, initial encounter: Secondary | ICD-10-CM | POA: Diagnosis present

## 2024-06-17 DIAGNOSIS — S300XXA Contusion of lower back and pelvis, initial encounter: Secondary | ICD-10-CM | POA: Diagnosis not present

## 2024-06-17 DIAGNOSIS — R000 Tachycardia, unspecified: Secondary | ICD-10-CM | POA: Diagnosis not present

## 2024-06-17 DIAGNOSIS — A419 Sepsis, unspecified organism: Secondary | ICD-10-CM | POA: Diagnosis not present

## 2024-06-17 DIAGNOSIS — S0990XA Unspecified injury of head, initial encounter: Secondary | ICD-10-CM | POA: Diagnosis not present

## 2024-06-17 DIAGNOSIS — S199XXA Unspecified injury of neck, initial encounter: Secondary | ICD-10-CM | POA: Diagnosis not present

## 2024-06-17 DIAGNOSIS — R110 Nausea: Secondary | ICD-10-CM | POA: Diagnosis not present

## 2024-06-17 DIAGNOSIS — M533 Sacrococcygeal disorders, not elsewhere classified: Secondary | ICD-10-CM | POA: Diagnosis not present

## 2024-06-17 DIAGNOSIS — R509 Fever, unspecified: Secondary | ICD-10-CM | POA: Diagnosis not present

## 2024-06-17 DIAGNOSIS — R531 Weakness: Secondary | ICD-10-CM | POA: Diagnosis not present

## 2024-06-17 DIAGNOSIS — W01198A Fall on same level from slipping, tripping and stumbling with subsequent striking against other object, initial encounter: Secondary | ICD-10-CM | POA: Diagnosis not present

## 2024-06-17 DIAGNOSIS — J8410 Pulmonary fibrosis, unspecified: Secondary | ICD-10-CM | POA: Diagnosis not present

## 2024-06-17 DIAGNOSIS — Z981 Arthrodesis status: Secondary | ICD-10-CM | POA: Diagnosis not present

## 2024-06-17 DIAGNOSIS — W19XXXA Unspecified fall, initial encounter: Secondary | ICD-10-CM

## 2024-06-17 LAB — COMPREHENSIVE METABOLIC PANEL WITH GFR
ALT: 9 U/L (ref 0–44)
AST: 20 U/L (ref 15–41)
Albumin: 4.5 g/dL (ref 3.5–5.0)
Alkaline Phosphatase: 84 U/L (ref 38–126)
Anion gap: 10 (ref 5–15)
BUN: 19 mg/dL (ref 8–23)
CO2: 27 mmol/L (ref 22–32)
Calcium: 10.2 mg/dL (ref 8.9–10.3)
Chloride: 99 mmol/L (ref 98–111)
Creatinine, Ser: 0.78 mg/dL (ref 0.44–1.00)
GFR, Estimated: >60 mL/min (ref >60)
Glucose, Bld: 117 mg/dL — ABNORMAL HIGH (ref 70–99)
Potassium: 3.4 mmol/L — ABNORMAL LOW (ref 3.5–5.1)
Sodium: 137 mmol/L (ref 135–145)
Total Bilirubin: 0.6 mg/dL (ref 0.0–1.2)
Total Protein: 7.9 g/dL (ref 6.5–8.1)

## 2024-06-17 LAB — CBC WITH DIFFERENTIAL/PLATELET
Abs Immature Granulocytes: 0.03 K/uL (ref 0.00–0.07)
Basophils Absolute: 0 K/uL (ref 0.0–0.1)
Basophils Relative: 0 %
Eosinophils Absolute: 0.1 K/uL (ref 0.0–0.5)
Eosinophils Relative: 1 %
HCT: 41 % (ref 36.0–46.0)
Hemoglobin: 13.5 g/dL (ref 12.0–15.0)
Immature Granulocytes: 0 %
Lymphocytes Relative: 3 %
Lymphs Abs: 0.3 K/uL — ABNORMAL LOW (ref 0.7–4.0)
MCH: 29.6 pg (ref 26.0–34.0)
MCHC: 32.9 g/dL (ref 30.0–36.0)
MCV: 89.9 fL (ref 80.0–100.0)
Monocytes Absolute: 0.7 K/uL (ref 0.1–1.0)
Monocytes Relative: 8 %
Neutro Abs: 7.6 K/uL (ref 1.7–7.7)
Neutrophils Relative %: 88 %
Platelets: 256 K/uL (ref 150–400)
RBC: 4.56 MIL/uL (ref 3.87–5.11)
RDW: 12.8 % (ref 11.5–15.5)
WBC: 8.7 K/uL (ref 4.0–10.5)
nRBC: 0 % (ref 0.0–0.2)

## 2024-06-17 LAB — URINALYSIS, W/ REFLEX TO CULTURE (INFECTION SUSPECTED)
Bilirubin Urine: NEGATIVE
Glucose, UA: NEGATIVE mg/dL
Hgb urine dipstick: NEGATIVE
Ketones, ur: NEGATIVE mg/dL
Leukocytes,Ua: NEGATIVE
Nitrite: NEGATIVE
Protein, ur: NEGATIVE mg/dL
Specific Gravity, Urine: 1.009 (ref 1.005–1.030)
pH: 7 (ref 5.0–8.0)

## 2024-06-17 LAB — RESP PANEL BY RT-PCR (RSV, FLU A&B, COVID)  RVPGX2
Influenza A by PCR: NEGATIVE
Influenza B by PCR: NEGATIVE
Resp Syncytial Virus by PCR: NEGATIVE
SARS Coronavirus 2 by RT PCR: NEGATIVE

## 2024-06-17 LAB — LACTIC ACID, PLASMA
Lactic Acid, Venous: 1.5 mmol/L (ref 0.5–1.9)
Lactic Acid, Venous: 1.4 mmol/L (ref 0.5–1.9)

## 2024-06-17 LAB — PROTIME-INR
INR: 1 (ref 0.8–1.2)
Prothrombin Time: 13.5 s (ref 11.4–15.2)

## 2024-06-17 MED ORDER — CEFTRIAXONE SODIUM 2 G IJ SOLR
2.0000 g | Freq: Once | INTRAMUSCULAR | Status: AC
  Administered 2024-06-17: 2 g via INTRAVENOUS
  Filled 2024-06-17: qty 20

## 2024-06-17 MED ORDER — LACTATED RINGERS IV SOLN: INTRAVENOUS | Status: DC

## 2024-06-17 MED ORDER — LACTATED RINGERS IV BOLUS (SEPSIS): 1000.0000 mL | Freq: Once | INTRAVENOUS | Status: DC

## 2024-06-17 MED ORDER — ACETAMINOPHEN 325 MG PO TABS
650.0000 mg | ORAL_TABLET | Freq: Once | ORAL | Status: AC
  Administered 2024-06-17: 650 mg via ORAL
  Filled 2024-06-17: qty 2

## 2024-06-17 MED ORDER — ONDANSETRON HCL 4 MG/2ML IJ SOLN
4.0000 mg | Freq: Once | INTRAMUSCULAR | Status: AC
  Administered 2024-06-17: 4 mg via INTRAVENOUS
  Filled 2024-06-17: qty 2

## 2024-06-17 MED ORDER — MORPHINE SULFATE (PF) 4 MG/ML IV SOLN
4.0000 mg | Freq: Once | INTRAVENOUS | Status: AC
  Administered 2024-06-17: 4 mg via INTRAVENOUS
  Filled 2024-06-17: qty 1

## 2024-06-17 MED ORDER — SODIUM CHLORIDE 0.9 % IV SOLN: Freq: Once | INTRAVENOUS | Status: AC

## 2024-06-17 NOTE — ED Notes (Signed)
 Patient has temp of 101 in triage. Denied any s/s but did say she was chilly last night and couldn't warm up.

## 2024-06-17 NOTE — ED Provider Notes (Signed)
 [place] EMERGENCY DEPARTMENT AT [facility]  Provider Note  CSN: [id number] Arrival date & time: 06/17/24 0542  History Chief Complaint  Patient presents with   Kristin Bush    Kristin Bush is a 73 y.o. female brought to the ED by son for evaluation after a fall. She reports she was having shaking chills when she went to bed, could not get warm. Got up around 0300hrs to go to the bathroom and got weak as she tried to stand up falling on the floor, hitting her head but did not have LOC. Complaining of tailbone pain. She was found to be febrile and tachycardic on arrival. Denies any recent URI symptoms but has history of pulmonary fibrosis. She has had increased urinary frequency recently. Some nausea but no vomiting or diarrhea.    Home Medications Prior to Admission medications   Medication Sig Start Date End Date Taking? Authorizing Provider  busPIRone (BUSPAR) 5 MG tablet Take 5 mg by mouth 2 (two) times daily. 08/05/23   [provider]  gabapentin (NEURONTIN) 300 MG capsule Take 300 mg by mouth 2 (two) times daily. 05/21/23   [provider]  Homeopathic Products (PROSACEA) GEL Apply 1 application topically daily as needed (rosacea).    [provider]  meloxicam  (MOBIC ) 7.5 MG tablet Take 7.5 mg by mouth daily. 03/22/23   [provider]  OFEV 150 MG CAPS TAKE 1 CAPSULE BY MOUTH TWICE DAILY (12 HOURS APART) WITH FOOD Patient not taking: Reported on 05/24/2024 02/10/24   Geronimo Amel, MD  ondansetron  (ZOFRAN ) 4 MG tablet Take 1 tablet (4 mg total) by mouth every 6 (six) hours. 04/10/24   Cleotilde Rogue, MD  traMADol (ULTRAM) 50 MG tablet Take 50 mg by mouth 2 (two) times daily as needed. 05/21/23   [provider]  triamterene -hydrochlorothiazide  (DYAZIDE ) 37.5-25 MG capsule Take 1 tablet by mouth daily.     [provider]     Allergies    Patient has no known allergies.   Review of Systems   Review of Systems Please  see HPI for pertinent positives and negatives  Physical Exam BP (!) 146/77   Pulse 96   Temp (!) 101.1 F (38.4 C)   Resp 16   Ht 5' 2 (1.575 m)   Wt 70.9 kg   SpO2 93%   BMI 28.59 kg/m   Physical Exam Vitals and nursing note reviewed.  Constitutional:      Appearance: Normal appearance.  HENT:     Head: Normocephalic and atraumatic.     Nose: Nose normal.     Mouth/Throat:     Mouth: Mucous membranes are moist.  Eyes:     Extraocular Movements: Extraocular movements intact.     Conjunctiva/sclera: Conjunctivae normal.  Cardiovascular:     Rate and Rhythm: Tachycardia present.  Pulmonary:     Effort: Pulmonary effort is normal.     Breath sounds: Normal breath sounds.  Abdominal:     General: Abdomen is flat.     Palpations: Abdomen is soft.     Tenderness: There is no abdominal tenderness.  Musculoskeletal:        General: Tenderness (sacrum) present. No swelling. Normal range of motion.     Cervical back: Neck supple. No tenderness.  Skin:    General: Skin is warm and dry.  Neurological:     General: No focal deficit present.     Mental Status: She is alert.  Psychiatric:  Mood and Affect: Mood normal.     ED Results / Procedures / Treatments   EKG EKG Interpretation Date/Time:  Thursday June 17 2024 06:51:27 EDT Ventricular Rate:  100 PR Interval:  182 QRS Duration:  112 QT Interval:  341 QTC Calculation: 440 R Axis:   -59  Text Interpretation: Sinus tachycardia LVH with secondary repolarization abnormality Anterolateral infarct, old Probable RV involvement, suggest recording right precordial leads Since last tracing QRS wider Non-specific ST-t changes Confirmed by Roselyn Dunnings [phone]) on 06/17/2024 6:58:23 AM  Procedures Procedures  Medications Ordered in the ED Medications  cefTRIAXone (ROCEPHIN) 2 g in sodium chloride  0.9 % 100 mL IVPB (2 g Intravenous New Bag/Given 06/17/24 0658)  0.9 %  sodium chloride  infusion (has no  administration in time range)  acetaminophen  (TYLENOL ) tablet 650 mg (650 mg Oral Given 06/17/24 0652)  0.9 %  sodium chloride  infusion ( Intravenous New Bag/Given 06/17/24 0657)    Initial Impression and Plan  Patient here after a fall with tailbone pain and reported head injury. Noted to be febrile and tachycardic on arrival. Will check imaging of her areas of injury and begin sepsis workup for presumed urine source given symptoms.   ED Course   Clinical Course as of 06/17/24 0714  Thu Jun 17, 2024  0713 Care signed out at shift change pending results.  [CS]    Clinical Course User Index [CS] Roselyn Dunnings NOVAK, MD     MDM Rules/Calculators/A&P Medical Decision Making Problems Addressed: Fall, initial encounter: acute illness or injury Sacral contusion, initial encounter: acute illness or injury Sepsis without acute organ dysfunction, due to unspecified organism [facility]): acute illness or injury  Amount and/or Complexity of Data Reviewed Labs: ordered. Radiology: ordered.  Risk OTC drugs. Prescription drug management.     Final Clinical Impression(s) / ED Diagnoses Final diagnoses:  Fall, initial encounter  Sacral contusion, initial encounter  Sepsis without acute organ dysfunction, due to unspecified organism [facility])    Rx / DC Orders ED Discharge Orders     None        Roselyn Dunnings NOVAK, MD 06/17/24 [phone]

## 2024-06-17 NOTE — Sepsis Progress Note (Signed)
 Per bedside RN she does not know if blood cultures drawn before antibiotics hung, she was not here yet

## 2024-06-17 NOTE — Sepsis Progress Note (Signed)
 Secure chat to RN to see if blood cultures were drawn before antibiotics hung

## 2024-06-17 NOTE — ED Notes (Signed)
 Pt in rad department antibiotics and labs delayed due to unavailability

## 2024-06-17 NOTE — Sepsis Progress Note (Signed)
 Elink monitoring for the code sepsis protocol.

## 2024-06-17 NOTE — ED Triage Notes (Signed)
 Pov from home. Cc of fall Says she slipped when she got out of the bed. C/o tailbone pain (8/10). Family said she also hit the back of her head (0/10). Says she feels nauseated

## 2024-06-17 NOTE — ED Provider Notes (Signed)
 Pt signed out by Dr. Roselyn.  CBC nl CMP nl Lactic nl Covid/flu/rsv neg UA neg  Xrays and ct scans reviewed by me.  I agree with the radiologist.  Sacrum/coccyx: 1. No acute osseous abnormality identified at the sacrum or coccyx.  2. Previous multilevel lumbar fusion and right SI joint arthrodesis.   CXR:  1. Chronic interstitial lung disease with decreased lung volumes since last  year.  2. No acute cardiopulmonary process or acute traumatic injury identified   CT head: 1. No acute traumatic injury identified  2. Moderate for age cerebral white matter changes most commonly due to chronic  small vessel disease.   CT cervical spine: No acute traumatic injury identified in the cervical spine.  2. Advanced chronic degeneration, no significant cervical spinal stenosis by CT.   Pt has been able to ambulate to the bathroom while here.  She lives alone and has been getting more weak.  She is willing to do home health/pt.  Pt also has pulmonary fibrosis and has been noticing more sob with mvmt.  Sons say pulmonologist has recommended oxygen, but she has refused.  She is willing to think about it now.  Source of infection unclear.  Labs and urine nl.  Pt feels well and wants to go home.  She is stable for d/c.  Return if worse. F/u with pcp/pulm.   Dean Clarity, MD 06/17/24 [phone]

## 2024-06-18 DIAGNOSIS — N390 Urinary tract infection, site not specified: Secondary | ICD-10-CM | POA: Diagnosis not present

## 2024-06-18 DIAGNOSIS — R52 Pain, unspecified: Secondary | ICD-10-CM | POA: Diagnosis not present

## 2024-06-18 DIAGNOSIS — M545 Low back pain, unspecified: Secondary | ICD-10-CM | POA: Diagnosis not present

## 2024-06-18 DIAGNOSIS — I10 Essential (primary) hypertension: Secondary | ICD-10-CM | POA: Diagnosis not present

## 2024-06-18 DIAGNOSIS — J849 Interstitial pulmonary disease, unspecified: Secondary | ICD-10-CM | POA: Diagnosis not present

## 2024-06-18 DIAGNOSIS — Z299 Encounter for prophylactic measures, unspecified: Secondary | ICD-10-CM | POA: Diagnosis not present

## 2024-06-18 DIAGNOSIS — R251 Tremor, unspecified: Secondary | ICD-10-CM | POA: Diagnosis not present

## 2024-06-22 LAB — CULTURE, BLOOD (ROUTINE X 2)
Culture: NO GROWTH
Culture: NO GROWTH
Special Requests: ADEQUATE
Special Requests: ADEQUATE

## 2024-06-23 ENCOUNTER — Encounter (INDEPENDENT_AMBULATORY_CARE_PROVIDER_SITE_OTHER): Payer: Self-pay | Admitting: Gastroenterology

## 2024-06-29 ENCOUNTER — Other Ambulatory Visit: Payer: Self-pay

## 2024-06-29 ENCOUNTER — Ambulatory Visit (INDEPENDENT_AMBULATORY_CARE_PROVIDER_SITE_OTHER): Admitting: Internal Medicine

## 2024-06-29 ENCOUNTER — Ambulatory Visit (INDEPENDENT_AMBULATORY_CARE_PROVIDER_SITE_OTHER)

## 2024-06-29 ENCOUNTER — Encounter: Payer: Self-pay | Admitting: Internal Medicine

## 2024-06-29 VITALS — BP 116/70 | HR 62 | Temp 97.8°F | Ht 60.25 in | Wt 155.6 lb

## 2024-06-29 DIAGNOSIS — R109 Unspecified abdominal pain: Secondary | ICD-10-CM

## 2024-06-29 DIAGNOSIS — J849 Interstitial pulmonary disease, unspecified: Secondary | ICD-10-CM

## 2024-06-29 DIAGNOSIS — Z836 Family history of other diseases of the respiratory system: Secondary | ICD-10-CM

## 2024-06-29 DIAGNOSIS — R0609 Other forms of dyspnea: Secondary | ICD-10-CM

## 2024-06-29 DIAGNOSIS — I8393 Asymptomatic varicose veins of bilateral lower extremities: Secondary | ICD-10-CM

## 2024-06-29 DIAGNOSIS — K5903 Drug induced constipation: Secondary | ICD-10-CM

## 2024-06-29 LAB — PULMONARY FUNCTION TEST
DL/VA % pred: 94 %
DL/VA: 4.02 ml/min/mmHg/L
DLCO cor % pred: 51 %
DLCO cor: 8.84 ml/min/mmHg
DLCO unc % pred: 52 %
DLCO unc: 8.87 ml/min/mmHg
FEF 25-75 Pre: 0.51 L/s
FEF2575-%Pred-Pre: 30 %
FEV1-%Pred-Pre: 50 %
FEV1-Pre: 0.95 L
FEV1FVC-%Pred-Pre: 88 %
FEV6-%Pred-Pre: 59 %
FEV6-Pre: 1.42 L
FEV6FVC-%Pred-Pre: 105 %
FVC-%Pred-Pre: 56 %
FVC-Pre: 1.42 L
Pre FEV1/FVC ratio: 67 %
Pre FEV6/FVC Ratio: 100 %

## 2024-06-29 LAB — D-DIMER, QUANTITATIVE: D-Dimer, Quant: 0.62 ug{FEU}/mL — ABNORMAL HIGH (ref <0.50)

## 2024-06-29 NOTE — Progress Notes (Signed)
 OV 08/12/2023 -new consult ILD center  Subjective:  Patient ID: Kristin Bush, female , DOB: 04/07/1951 , age 73 y.o. , MRN: [id number] , ADDRESS: [address] [place] [address] PCP Rosamond Leta NOVAK, MD Patient Care Team: Rosamond Leta NOVAK, MD as PCP - General (Internal Medicine) Mallipeddi, Diannah SQUIBB, MD as PCP - Cardiology (Cardiology)  This Provider for this visit: Treatment Team:  Attending Provider: Mannam, Praveen, MD    08/12/2023 -   Chief Complaint  Patient presents with   Consult    Sob, coughing with clear with mucus, using otc just started yesterday, having some tightness in chest , discuss results  ct scan that she had done on 06/17/23 at [facility]  , learn more about her dx and is this family hx. Discuss treatment         HPI Kristin Bush 73 y.o. -presents with her son Lemond over concerns that she has interstitial lung disease.  She tells me that she has a strong family history of interstitial lung disease.  Her mother died 45 years ago from pulmonary fibrosis.  She is the youngest of 9 siblings and she is the fourth to have interstitial lung disease/pulmonary fibrosis.  The second older sibling brother died in July 19, 2005 from pulmonary fibrosis.  The fifth oldest died in July 19, 2016 from pulmonary fibrosis was a female.  Then the eighth oldest was a female who died in 07-20-11 from pulmonary fibrosis.  She denies any bird exposure in the house.  Denies any mold.  Denies any down so first.  Denies any autoimmune disease.  Denies any Raynaud's.  No history of collagen vascular disease or connective tissue disease.  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  She was exposed to wood dust.  She is also worked in the [employer].  There is no acid reflux or MI or stroke.   In 07-19-2021 she did have a CT abdomen lung images show presence of ILD on report but I could not visualize this image.  But in July 20, 2019 for July 19, 2024 she had a CT chest in the [facility] system that is  actually available for my visualization.  I agree with the NSIP report alternate diagnosis.  But she tells me that she is been short of breath for the last 6 months along with cough.  The cough started first.  In spring 2024 she got sick with a respiratory viral infection and it felt like the cough never went away.  Then in August 2020 for the shortness of breath started.  Is present with exertion relieved by rest.  Present for going from the bedroom to the bathroom washing close dishes and doing groceries.  She does not know if it is progressive but the son does feel it is progressive.  Both of them attest that a year ago around Christmas 2023 she was fine.   CT chest findings in ABD CT April 2022 -could not visualize this at all.  FINDINGS:  Lower chest: Diffuse chronic interstitial coarsening. The visualized  lung bases are otherwise clear. There is coronary vascular  calcification.     CT Chest 11/10/24at [facility] = personally visualized and agree with the findings   IMPRESSION:  1. Chronic interstitial lung disease with patchy multifocal  ground-glass attenuation, subpleural and peribronchovascular  reticulation and architectural distortion. Findings are most  consistent with chronic fibrotic interstitial lung disease,  potentially NSIP. Consider  follow-up high-resolution chest CT in 3-6  months.  2. No confluent airspace disease or suspicious pulmonary nodule  demonstrated.  3. No definite acute chest findings.  4.  Aortic Atherosclerosis (ICD10-I70.0).    Electronically Signed    By: Elsie Perone M.D.    On: 06/25/2023 10:44    CT Chest data from date: 07/23/23 coronary CT  - personally visualized and independently interpreted : Yes - my findings are: NSIP groundglass opacities alternate diagnosis.  ADDENDUM REPORT: 08/09/2023 18:33   EXAM: OVER-READ INTERPRETATION  CT CHEST   The following report is an over-read performed by radiologist Dr. Fonda Mom  [facility] on 08/09/2023. This over-read does not include interpretation of cardiac or coronary anatomy or pathology. The coronary CTA interpretation by the cardiologist is attached.   COMPARISON:  06/17/2023.   FINDINGS: Cardiovascular:  See findings discussed in the body of the report.   Mediastinum/Nodes: No suspicious adenopathy identified. Imaged mediastinal structures are unremarkable.   Lungs/Pleura: Extensive interstitial prominence diffusely consistent with pulmonary edema versus atypical infection or interstitial lung disease. No alveolar consolidation. No pleural effusion or pneumothorax.   Upper Abdomen: No acute abnormality.   Musculoskeletal: No chest wall abnormality. No acute osseous findings. There are thoracic degenerative changes.   IMPRESSION: Extensive interstitial prominence consistent with pulmonary edema versus atypical infection or interstitial lung disease.     Electronically Signed   By: Fonda Field M.D.   On: 08/09/2023 18:33  OV 10/09/2023  Subjective:  Patient ID: Kristin Bush, female , DOB: Dec 26, 1950 , age 73 y.o. , MRN: [id number] , ADDRESS: [address] [place] [address] PCP Rosamond Leta NOVAK, MD Patient Care Team: Rosamond Leta NOVAK, MD as PCP - General (Internal Medicine) Mallipeddi, Diannah SQUIBB, MD as PCP - Cardiology (Cardiology)  This Provider for this visit: Treatment Team:  Attending Provider: Geronimo Amel, MD    10/09/2023 -   Chief Complaint  Patient presents with   Follow-up    Pt following up with full work up, labs, pft, and ono. Denies any concerns    #ILD workup in progress.  Does not have high-resolution CT scan chest as yet. Interim Health status: No new complaints No new medical problems. No new surgeries. No ER visits. No Urgent care visits. No changes to medications but she did do the ILD questionnaire which we reviewed.  HPI Kristin Bush 73 y.o. -   [facility] [facility] Comprehensive ILD  Questionnaire  Symptoms:   Symptoms started suddenly 6 months ago and since then it is progressive.  Cough started in April 2024.  She does cough at night.  She has some clear sputum she does clear her throat. Past Medical History :  -Positive for obesity - Has had COVID disease in September 2021. - Detail connective tissue disease is negative   ROS:  -Positive for fatigue for the last several months arthralgia for several years - Dry eyes for the last few years 5 pound weight loss for the last few weeks  FAMILY HISTORY of LUNG DISEASE:  -Multiple family members with pulmonary fibrosis.  Especially mother, 2 brothers and 1 sister.  Her sister got admitted 1 Easter for ILD.  Apparently it was fairly advanced.  She underwent biopsy in the lung collapse and then by July 23, 2025of that year she passed away.  Therefore patient is petrified of event undergoing general anesthesia and she does not want to do lung biopsy.  She is never even had colonoscopy.  PERSONAL  EXPOSURE HISTORY:  -She never smoked.  No marijuana no cocaine no intravenous drug use.  HOME  EXPOSURE and HOBBY DETAILS :  [place] but the home itself is 25 years.  There are some mold and mildew in the bathroom but otherwise detail organic antigen exposure history is negative.  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  She retired in 2019.  Before that for 15 years she worked in [employer].  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  She got exposed to cotton and nylon but she denies cutting nylon.  Before that she worked in [employer] for 30 years where she was exposed to wood dust.  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  She is also done drawl lumbar to finish goods.  During this time she was exposed to industrial  dust.  PULMONARY TOXICITY HISTORY (27 items):  Detail o medication history for pulmonary toxicity is negative  INVESTIGATIONS: Pulmonary function test shows at least moderate restriction with reduction in diffusion  No high-resolution CT chest has yet     OV 11/20/2023  Subjective:  Patient ID: Kristin Bush, female , DOB: 11-Sep-1950 , age 77 y.o. , MRN: [id number] , ADDRESS: [address] [place] [address] PCP Rosamond Leta NOVAK, MD Patient Care Team: Rosamond Leta NOVAK, MD as PCP - General (Internal Medicine) Mallipeddi, Diannah SQUIBB, MD as PCP - Cardiology (Cardiology)  This Provider for this visit: Treatment Team:  Attending Provider: Geronimo Amel, MD    11/20/2023 -   Chief Complaint  Patient presents with   Follow-up    Increased SOB when lies down at night over the pastr 3 wks. She started OFEV 150 mg 1 wk ago- has had constipation and cramping occ.     HPI Kristin Bush 73 y.o. -returns for follow-up.  The main purpose of this visit is to see uptake with nintedanib now she is only been taking it for 1 week and she is already constipated.  Normally this drug causes diarrhea but it has been paradoxical for her.  She does have some mild cramping but there is no tenesmus.  There is no fevers no dysuria no blood in the stool.  She is also probably a little bit more tired at the end of the day since starting the nintedanib.  From a respiratory standpoint: She think she is stable.  She says shortness of breath of exertion going to the car is improved but by end of the day since starting nintedanib she is a little more tired.  Symptom score and walking desaturation test shows stability  From a ILD etiology standpoint: She had a high-resolution CT chest that I personally visualized it suggestive of NSIP.  I did not appreciate air trapping but the official report is still pending.  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  I personally  communicated via email with the genetic counselor Darice Monte.  The first patient she is seen with this mutation.  The details of the mutation are above.  I shared this with the patient.  She does have dry eye and dry mouth but I did not see any mechanic hands but I did advise the patient because of trace ANA  positivity that we will check a myositis panel and she is in agreement.  Lung biopsy is under consideration but she is hesitant towards anesthesia procedures.  I told her I respected that and the best option would be to discuss in case conference and also monitor for progression and consider immunomodulatory therapy such as CellCept based on the outcomes of the conference and also progression.  She is again aligned with this.   OV 02/10/2024  Subjective:  Patient ID: Kristin Bush, female , DOB: 01-17-1951 , age 71 y.o. , MRN: [id number] , ADDRESS: [address] [place] [address] PCP Rosamond Leta NOVAK, MD Patient Care Team: Rosamond Leta NOVAK, MD as PCP - General (Internal Medicine) Mallipeddi, Diannah SQUIBB, MD as PCP - Cardiology (Cardiology)  This Provider for this visit: Treatment Team:  Attending Provider: Geronimo Amel, MD    02/10/2024 -   Chief Complaint  Patient presents with   Interstitial Lung Disease    HPI Kristin Bush 73 y.o. -returns for follow-up.  Presents with her daughter-in-law Mercy.  Since I last saw her in March 2025 she is continue to have constipation and then in mid May 2025 she ended up in the ER with diverticulitis.  She called our office and was sent to the ER.  She was then discharged on Flagyl and ciprofloxacin which she says tore my stomach up.  She describes rumbling, pain, nausea and all of this was worse.  And then this was stopped and she was put on Augmentin  which was also similar side effects but she finished 5 days of it last Monday.  At this point in time she is better but she says she is still got diminished appetite and aversion to food nausea and  abdominal cramping.  Throughout although she is continued her nintedanib.  I did indicate to her nintedanib can cause similar side effects.  She was surprised she does not want to stop the nintedanib ideally because of the interstitial lung disease and strong family history and propensity for progression.  But dyspnea wise she is stable.  Her exercise hypoxemia test is stable and her pulmonary function test is improved/stable.  Overall I believe the ILD itself is stable.   CT Chest data from date: March 2025 Narrative & Impression  CLINICAL DATA:  Diffuse/interstitial lung disease. Chronic congestion and wheezing.   EXAM: CT CHEST WITHOUT CONTRAST   TECHNIQUE: Multidetector CT imaging of the chest was performed following the standard protocol without intravenous contrast. High resolution imaging of the lungs, as well as inspiratory and expiratory imaging, was performed.   RADIATION DOSE REDUCTION: This exam was performed according to the departmental dose-optimization program which includes automated exposure control, adjustment of the mA and/or kV according to patient size and/or use of iterative reconstruction technique.   COMPARISON:  06/17/2023.   FINDINGS: Cardiovascular: Atherosclerotic calcification of the aorta and left anterior descending coronary artery. Heart is at the upper limits of normal in size to mildly enlarged. No pericardial effusion.   Mediastinum/Nodes: No pathologically enlarged mediastinal or axillary lymph nodes. Hilar regions are difficult to definitively evaluate without IV contrast. Esophagus is grossly unremarkable.   Lungs/Pleura: Interstitial coarsened ground-glass with subpleural reticulation, ground-glass and traction bronchiectasis/bronchiolectasis, findings similar to 06/17/2023. There may be slight basilar predominance of the findings. Expiratory phase imaging was not performed in true expiration, limiting the evaluation for air trapping. No  pleural fluid. Airway is unremarkable.   Upper Abdomen: Peripherally calcified splenic artery aneurysm measures 7 mm.  Visualized portions of the liver, gallbladder, adrenal glands, kidneys, spleen, pancreas, stomach and bowel are otherwise grossly unremarkable. No upper abdominal adenopathy.   Musculoskeletal: Degenerative changes in the spine.   IMPRESSION: 1. Pulmonary parenchymal pattern of interstitial lung may be due to fibrotic nonspecific interstitial pneumonitis or usual interstitial pneumonitis. Findings are indeterminate for UIP per consensus guidelines: Diagnosis of Idiopathic Pulmonary Fibrosis: An Official ATS/ERS/JRS/ALAT Clinical Practice Guideline. Am JINNY [employer] Crit Care Med Vol 198, Iss 5, [phone], May 10 2017. 2. 7 mm peripherally calcified splenic artery aneurysm. 3. Aortic atherosclerosis (ICD10-I70.0). Left anterior descending coronary artery calcification.     Electronically Signed   By: Newell Eke M.D.   On: 11/20/2023 16:19       04/29/2024 Follow up ; ILD  Discussed the use of AI scribe software for clinical note transcription with the patient, who gave verbal consent to proceed.  History of Present Illness Kristin Bush is a 73 year old female with ILD (NSIP)  who presents with severe gastrointestinal symptoms and weight loss. Since her last visit in June, she has been experiencing severe gastrointestinal symptoms, including episodes of diarrhea and constipation, nausea, and significant weight loss.   She was recommended to use MiraLAX to manage constipation.  Last visit patient was recommended for a drug holiday with her Ofev.  She says as soon as she restarted it she continued to have ongoing daily nausea low appetite and weight has been going down she is down 20 pounds since earlier this year.  She said during both of these episodes she was severely sick and still has not totally recovered. She is currently taking Ofev twice daily.  She is  unable to perform daily activities such as standing at the kitchen sink or attending church without feeling sick. She denies any flare of coughing.  But does get short of breath and has low energy currently Most recent high-resolution CT chest November 10, 2023 showed interstitial groundglass with subpleural reticulation and traction bronchiectasis similar to August 2024 findings are indeterminate for UIP.  Appeared fibrotic nonspecific interstitial pneumonitis   OV 06/29/2024  Subjective:  Patient ID: Kristin Bush, female , DOB: 30-Aug-1951 , age 44 y.o. , MRN: [id number] , ADDRESS: [address] [place] [address] PCP Rosamond Leta NOVAK, MD Patient Care Team: Rosamond Leta NOVAK, MD as PCP - General (Internal Medicine) Mallipeddi, Diannah SQUIBB, MD as PCP - Cardiology (Cardiology)  This Provider for this visit: Treatment Team:  Attending Provider: Geronimo Amel, MD    #ILD-with family history.  Unclear specific variety - RADIOOGICA NSIP  -March 2025: Genetic testing with Darice Monte  -  single pathogenic variant in the PARN gene. Specifically, this variant is c.500C>G (p.Ser167*).   - he expression of pathogenic PARN mutations can range from IPF to Dyskeratosis Congenita (DC).  DC is characterized by abnormal skin pigmentation, nail dystrophy, oral leukoplakia, progressive bone marrow failure, pulmonary fibrosis and increased risk for certain cancers such as squamous cell carcinoma and hematolymphoid neoplasms. Some individuals with a pathogenic PARN variant can develop a severe form of DC called Hoyeraal-Hreidarsson syndrome (HHS).   #Nintedanib/Ofev requires intensive drug monitoring due to high concerns for Adverse effects of , including  Drug Induced Liver Injury, significant GI side effects that include but not limited to Diarrhea, Nausea, Vomiting,  and other system side effects that include Fatigue,  weight loss. Cardiac side effects are a black box warning as well. These will be monitored with  blood  work  such as LFT initially once a month for 6 months and then quarterly  -Started first dose March 2025.SABRA Course complicated by   - Two major episodes of diverticulitis required treatment in the emergency room in May and also in August .  She has been seen by gastroenterology and underwent colonoscopy that showed diverticulosis.. Also 20# weight loss  - stopped Aug 2025  06/29/2024 -   Chief Complaint  Patient presents with   Interstitial Lung Disease    PFT F/U Pt states since LOV breathing has gotten worse more SOB occurring w/ any activity done  Pt also states at night time she has a harder time breathing  Dry cough      HPI Kristin Bush 73 y.o. -returns for followup; . Presents with daughter. After stopping ofev x 2  months GI issues all resolved. However, feeeling worsening DOE Some 4-6 weeks ago started feeling suffocating at night. And for 3-4 weeks insidious onset worsening DOE. Difficulty doing groceries. Difficulty walking car to home. Symptom socore shows worsening dyspnea. She was slow doing eercise hypoxemia test and could not hit goal of 15 X but got very dyspenci without rise in HR > 90 and change in pulse ox. Of note, she did fall 06/17/24 early in morning on buttocks and as bruise on tail bone. Seen in ER by Dr Josephine - notes reviewed. PFT shows stable FVC and some reduction in DLCO > FVC raising concern for Pulm HTn.   Of note she does have significant baseline varicose veints     SYMPTOM SCALE - ILD 10/09/2023 11/20/2023 OFEV now -having paradoxical constipation 02/10/2024 Mid May 2020 for ER visit for diverticulitis 06/29/2024 Off ofev since aug 2025  Current weight      O2 use ra ra ra ra  Shortness of Breath 0 -> 5 scale with 5 being worst (score 6 If unable to do)     At rest 3 2 2 3   Simple tasks - showers, clothes change, eating, shaving 3 2 2 4   Household (dishes, doing bed, laundry) 4 2 2 4   Shopping 4 3 2 5   Walking level at own pace 4 3 3 4   Walking  up Stairs 4 3 4 5   Total (30-36) Dyspnea Score 21 15 15 27   How bad is your cough? 2 1 2 3   How bad is your fatigue 4 3 4 3   How bad is nausea 0 0 5 0  How bad is vomiting?  0 0 0 0  How bad is diarrhea? 0 0 3 0  How bad is anxiety? 4 3 2 3   How bad is depression 0 2 0 0  Any chronic pain - if so where and how bad 0 0 0 0            SIT STAND TEST - goal 15 times   02/10/2024  06/29/2024   O2 used ra ra  PRobe - finter or forehead finger Onlt 10 times due to recent fall and burisi tail bone  Number sit and stand completed - goal 15 15 10   Time taken to complete 2 min 1 min and 6 sec  Resting Pulse Ox/HR/Dyspnea  98% and 87/min and dyspnea of 0/10  97% andHR 59 and score 3  Peak measures 97 % and 99/min and dyspnea of 2/10 96% , HR 76 and score 8  Final Pulse Ox/HR 97% and 90/min and dyspnea of 1/10 97% and HR 62 and score 4  Desaturated </=  88% no no  Desaturated <= 3% points no no  Got Tachycardic >/= 90/min yes no  Miscellaneous comments x Out of proportio dyspnea    PFT     Latest Ref Rng & Units 06/29/2024    2:39 PM 01/30/2024   12:36 PM 10/09/2023   12:51 PM  PFT Results  FVC-Pre L 1.42  P 1.52  1.38   FVC-Predicted Pre % 56  P 59  54   FVC-Post L   1.40   FVC-Predicted Post %   55   Pre FEV1/FVC % % 67  P 74  68   Post FEV1/FCV % %   77   FEV1-Pre L 0.95  P 1.12  0.94   FEV1-Predicted Pre % 50  P 58  48   FEV1-Post L   1.08   DLCO uncorrected ml/min/mmHg 8.87  P 19.11  11.63   DLCO UNC% % 52  P 111  68   DLCO corrected ml/min/mmHg 8.84  P  11.40   DLCO COR %Predicted % 51  P  66   DLVA Predicted % 94  P 105  113   TLC L   2.53   TLC % Predicted %   56   RV % Predicted %   61     P Preliminary result       LAB RESULTS last 96 hours No results found.       has a past medical history of Arthritis, Depression, Dyspnea, Family history of pancreatic cancer, Hypertension, Pulmonary fibrosis (HCC) (05/2023), SI (sacroiliac) joint dysfunction,  Spondylolisthesis, Wears glasses, and Wears partial dentures.   reports that she has never smoked. She has never used smokeless tobacco.  Past Surgical History:  Procedure Laterality Date   ABDOMINAL HYSTERECTOMY     BACK SURGERY  05/28/2018   PLIF   CATARACT EXTRACTION W/PHACO Left 10/06/2014   Procedure: CATARACT EXTRACTION PHACO AND INTRAOCULAR LENS PLACEMENT LEFT EYE;  Surgeon: Cherene Mania, MD;  Location: [facility];  Service: Ophthalmology;  Laterality: Left;  CDE:5.49   CATARACT EXTRACTION W/PHACO Right 10/17/2014   Procedure: CATARACT EXTRACTION PHACO AND INTRAOCULAR LENS PLACEMENT RIGHT EYE CDE=9.81;  Surgeon: Cherene Mania, MD;  Location: [facility];  Service: Ophthalmology;  Laterality: Right;   COLONOSCOPY N/A 03/24/2024   Procedure: COLONOSCOPY;  Surgeon: Eartha Angelia Sieving, MD;  Location: [facility];  Service: Gastroenterology;  Laterality: N/A;  9:00 am, asa 1/2   MULTIPLE TOOTH EXTRACTIONS     ROTATOR CUFF REPAIR     right shoulder   SACROILIAC JOINT FUSION Right 11/18/2019   Procedure: RIGHT SACROILIAC JOINT FUSION;  Surgeon: Beuford Anes, MD;  Location: [facility];  Service: Orthopedics;  Laterality: Right;   SHOULDER ARTHROSCOPY WITH SUBACROMIAL DECOMPRESSION, ROTATOR CUFF REPAIR AND BICEP TENDON REPAIR Left 09/17/2018   Procedure: LEFT SHOULDER ARTHROSCOPY WITH DEBRIDEMENT, SUBACROMIAL DECOMPRESSION, ROTATOR CUFF;  Surgeon: Cristy Bonner DASEN, MD;  Location: [facility];  Service: Orthopedics;  Laterality: Left;   TRANSFORAMINAL LUMBAR INTERBODY FUSION (TLIF) WITH PEDICLE SCREW FIXATION 1 LEVEL Right 08/09/2021   Procedure: RIGHT-SIDED LUMBAR 3 - LUMBAR 4 TRANSFORAMINAL LUMBAR INTERBODY FUSION WITH INSTRUMENTATION AND ALLOGRAFT;  Surgeon: Beuford Anes, MD;  Location: [facility];  Service: Orthopedics;  Laterality: Right;   TUBAL LIGATION      Allergies  Allergen Reactions   Nintedanib     Abdominal cramps    Immunization History  Administered Date(s) Administered    Fluad Quad(high Dose 65+) 06/15/2024   Fluad Trivalent(High  Dose 65+) 06/11/2023   Td (Adult),5 Lf Tetanus Toxid, Preservative Free 07/07/1998    Family History  Problem Relation Age of Onset   Pulmonary fibrosis Mother 59   Aneurysm Father 41   Diabetes Sister    Hypertension Sister    Pulmonary fibrosis Sister 81   Heart attack Brother        d. 54s   Pulmonary fibrosis Brother 17   Pulmonary fibrosis Brother    Esophageal cancer Brother    Pancreatic cancer Brother    COPD Maternal Aunt    Stroke Paternal Aunt    Pneumonia Maternal Grandmother    Kidney disease Maternal Grandfather    Stroke Paternal Grandmother    Pulmonary fibrosis Cousin        mat first cousin     Current Outpatient Medications:    busPIRone (BUSPAR) 5 MG tablet, Take 5 mg by mouth 2 (two) times daily., Disp: , Rfl:    gabapentin (NEURONTIN) 300 MG capsule, Take 300 mg by mouth 2 (two) times daily., Disp: , Rfl:    meloxicam  (MOBIC ) 7.5 MG tablet, Take 7.5 mg by mouth daily., Disp: , Rfl:    propranolol (INDERAL) 20 MG tablet, Take 20 mg by mouth 3 (three) times daily., Disp: , Rfl:    traMADol (ULTRAM) 50 MG tablet, Take 50 mg by mouth 2 (two) times daily as needed., Disp: , Rfl:    triamterene -hydrochlorothiazide  (DYAZIDE ) 37.5-25 MG capsule, Take 1 tablet by mouth daily. , Disp: , Rfl:    Homeopathic Products (PROSACEA) GEL, Apply 1 application topically daily as needed (rosacea). (Patient not taking: Reported on 06/29/2024), Disp: , Rfl:    OFEV 150 MG CAPS, TAKE 1 CAPSULE BY MOUTH TWICE DAILY (12 HOURS APART) WITH FOOD (Patient not taking: Reported on 06/29/2024), Disp: 180 capsule, Rfl: 1   ondansetron  (ZOFRAN ) 4 MG tablet, Take 1 tablet (4 mg total) by mouth every 6 (six) hours. (Patient not taking: Reported on 06/29/2024), Disp: 12 tablet, Rfl: 0      Objective:   Vitals:   06/29/24 1514  BP: 116/70  Pulse: 62  Temp: 97.8 F (36.6 C)  TempSrc: Oral  SpO2: 97%  Weight: 155 lb 9.6 oz  (70.6 kg)  Height: 5' 0.25 (1.53 m)    Estimated body mass index is 30.14 kg/m as calculated from the following:   Height as of this encounter: 5' 0.25 (1.53 m).   Weight as of this encounter: 155 lb 9.6 oz (70.6 kg).  @WEIGHTCHANGE @  [employer]   06/29/24 1514  Weight: 155 lb 9.6 oz (70.6 kg)     Physical Exam   General: No distress. Looks chronic unwell O2 at rest: no Cane present: no Sitting in wheel chair: no Frail: no Obese: no Neuro: Alert and Oriented x 3. GCS 15. Speech normal Psych: Pleasant Resp:  Barrel Chest - no.  Wheeze - no, Crackles - YES BASE, No overt respiratory distress CVS: Normal heart sounds. Murmurs - no Ext: Stigmata of Connective Tissue Disease - no. VARICOSE VEINS + HEENT: Normal upper airway. PEERL +. No post nasal drip        Assessment/     Assessment & Plan ILD (interstitial lung disease) (HCC)  Family history of pulmonary fibrosis  DOE (dyspnea on exertion)  Varicose veins of both lower extremities, unspecified whether complicated  Abdominal pain, unspecified abdominal location    PLAN Patient Instructions     ICD-10-CM   1. ILD (interstitial lung disease) (HCC)  J84.9     2. Family history of pulmonary fibrosis  Z83.6     3. DOE (dyspnea on exertion)  R06.09     4. Varicose veins of both lower extremities, unspecified whether complicated  I83.93     5. Abdominal pain, unspecified abdominal location  R10.9         -Based breathing test it is unclear if pulmonary fibrosis is worse.  I suspect not.  Nevertheless you have significant worsening of shortness of breath that is out of proportion to your exercise performance and PFT  Plan - Repeat overnight pulse oximetry on room air - Check blood work for D-dimer and BNP  -  - If D-dimer is abnormal we will get a CT angiogram chest rule out blood clot - Order echocardiogram - Based on these results he might need a right heart catheterization [research protocol  versus standard of care] to rule out pulmonary hypertension.  - at some point consider pulmonary rehab - Do repeat spirometry and DLCO in 3 months  Drug induced constipation History of diverticulitis mid May 2025 and ongoing abdominal symptoms.  Did not tolerate ofev  stopped Aug 2025 Plan GI symptoms resolved after stopping Ofev    Plan -CMA to list Ofev as allergy [GI side effects] - We will consider you for Nerandomilast at follow-up  Follow-up -  video visit with nurse practitioner in 1 or 2 weeks to discuss blood test results-  -3 months Dr. Geronimo after spirometry and DLCO    FOLLOWUP    Return for  video visit with nurse practitioner in 1 or 2 weeks to discuss blood test results-.   ( Level 05 visit E&M 2024: Estb >= 40 min  visit type: on-site physical face to visit  in total care time and counseling or/and coordination of care by this undersigned MD - Dr Dorethia Geronimo. This includes one or more of the following on this same day 06/29/2024: pre-charting, chart review, note writing, documentation discussion of test results, diagnostic or treatment recommendations, prognosis, risks and benefits of management options, instructions, education, compliance or risk-factor reduction. It excludes time spent by the CMA or office staff in the care of the patient. Actual time 40 min)    SIGNATURE    Dr. Dorethia Geronimo, M.D., F.C.C.P,  Pulmonary and Critical Care Medicine Staff Physician, [facility] Center Director - Interstitial Lung Disease  Program  Pulmonary Fibrosis [facility] at [facility] [place], [place], [address]  Pager: [phone], If no answer or between  15:00h - 7:00h: call [phone] Telephone: [phone]  10:20 PM 06/29/2024

## 2024-06-29 NOTE — Progress Notes (Signed)
Spiro/DLCO performed today. 

## 2024-06-29 NOTE — Patient Instructions (Signed)
Spiro/DLCO performed today. 

## 2024-06-29 NOTE — Patient Instructions (Addendum)
 ICD-10-CM   1. ILD (interstitial lung disease) (HCC)  J84.9     2. Family history of pulmonary fibrosis  Z83.6     3. DOE (dyspnea on exertion)  R06.09     4. Varicose veins of both lower extremities, unspecified whether complicated  I83.93     5. Abdominal pain, unspecified abdominal location  R10.9         -Based breathing test it is unclear if pulmonary fibrosis is worse.  I suspect not.  Nevertheless you have significant worsening of shortness of breath that is out of proportion to your exercise performance and PFT  Plan - Repeat overnight pulse oximetry on room air - Check blood work for D-dimer and BNP  -  - If D-dimer is abnormal we will get a CT angiogram chest rule out blood clot - Order echocardiogram - Based on these results he might need a right heart catheterization [research protocol versus standard of care] to rule out pulmonary hypertension.  - at some point consider pulmonary rehab - Do repeat spirometry and DLCO in 3 months  Drug induced constipation History of diverticulitis mid May 2025 and ongoing abdominal symptoms.  Did not tolerate ofev  stopped Aug 2025 Plan GI symptoms resolved after stopping Ofev    Plan -CMA to list Ofev as allergy [GI side effects] - We will consider you for Nerandomilast at follow-up  Follow-up -  video visit with nurse practitioner in 1 or 2 weeks to discuss blood test results-  -3 months Dr. Geronimo after spirometry and DLCO

## 2024-06-30 ENCOUNTER — Telehealth: Payer: Self-pay | Admitting: *Deleted

## 2024-06-30 ENCOUNTER — Ambulatory Visit: Payer: Self-pay | Admitting: Internal Medicine

## 2024-06-30 ENCOUNTER — Ambulatory Visit: Payer: Self-pay | Admitting: Adult Health

## 2024-06-30 ENCOUNTER — Other Ambulatory Visit: Payer: Self-pay | Admitting: *Deleted

## 2024-06-30 DIAGNOSIS — R7989 Other specified abnormal findings of blood chemistry: Secondary | ICD-10-CM

## 2024-06-30 LAB — BRAIN NATRIURETIC PEPTIDE: Pro B Natriuretic peptide (BNP): 40 pg/mL (ref 0.0–100.0)

## 2024-06-30 NOTE — Progress Notes (Signed)
 Patient returned call, provided results/recommendations per Dr. Geronimo.  She verbalized understanding.  She has the Echo scheduled for 10/23 and the CTA scheduled for 10/24.  Nothing further needed.

## 2024-06-30 NOTE — Telephone Encounter (Signed)
 See othet message. Needs CTA rule out PE. Do 06/30/2024 or 07/01/24 at [facility]. This is for [employer] . Her d-dimer slightly high

## 2024-06-30 NOTE — Telephone Encounter (Signed)
 Copied from CRM [id number]. Topic: Clinical - Lab/Test Results >> Jun 30, 2024  1:52 PM Isabell A wrote: Reason for CRM: Patient returning phone call from [facility] for lab results.   Callback number: [phone]  Patient returned call, provided results/recommendations per Dr. Geronimo.  She verbalized understanding.  She has the Echo scheduled for 10/23 and the CTA scheduled for 10/24.  Nothing further needed.

## 2024-06-30 NOTE — Telephone Encounter (Signed)
 D-d imer slihgtly high  Plan  Get CTA this week - pls order 06/30/2024 OR 07/01/24  [facility]abluy at [facility]  Results to page [phone] for after hours    [address] [place] [address]

## 2024-06-30 NOTE — Progress Notes (Signed)
 ATC patient x1.  Requested that she return our call.

## 2024-07-01 ENCOUNTER — Ambulatory Visit (HOSPITAL_COMMUNITY)
Admission: RE | Admit: 2024-07-01 | Discharge: 2024-07-01 | Disposition: A | Discharge status: Home / Self Care | Source: Ambulatory Visit | Attending: Internal Medicine | Admitting: Internal Medicine

## 2024-07-01 DIAGNOSIS — J849 Interstitial pulmonary disease, unspecified: Secondary | ICD-10-CM | POA: Diagnosis not present

## 2024-07-01 DIAGNOSIS — I8393 Asymptomatic varicose veins of bilateral lower extremities: Secondary | ICD-10-CM | POA: Diagnosis not present

## 2024-07-01 DIAGNOSIS — Z836 Family history of other diseases of the respiratory system: Secondary | ICD-10-CM | POA: Insufficient documentation

## 2024-07-01 DIAGNOSIS — R0609 Other forms of dyspnea: Secondary | ICD-10-CM | POA: Insufficient documentation

## 2024-07-01 LAB — ECHOCARDIOGRAM COMPLETE
Area-P 1/2: 2.76 cm2
S' Lateral: 2.8 cm

## 2024-07-02 ENCOUNTER — Ambulatory Visit (HOSPITAL_COMMUNITY)
Admission: RE | Admit: 2024-07-02 | Discharge: 2024-07-02 | Disposition: A | Discharge status: Home / Self Care | Source: Ambulatory Visit | Attending: Internal Medicine | Admitting: Internal Medicine

## 2024-07-02 DIAGNOSIS — R0602 Shortness of breath: Secondary | ICD-10-CM | POA: Diagnosis not present

## 2024-07-02 DIAGNOSIS — R7989 Other specified abnormal findings of blood chemistry: Secondary | ICD-10-CM | POA: Insufficient documentation

## 2024-07-02 MED ORDER — IOHEXOL 350 MG/ML SOLN
75.0000 mL | Freq: Once | INTRAVENOUS | Status: AC | PRN
  Administered 2024-07-02: 75 mL via INTRAVENOUS

## 2024-07-05 ENCOUNTER — Ambulatory Visit: Payer: Self-pay | Admitting: Internal Medicine

## 2024-07-05 NOTE — Progress Notes (Signed)
 No PE, Good news

## 2024-07-15 ENCOUNTER — Encounter: Payer: Self-pay | Admitting: Nurse Practitioner

## 2024-07-15 ENCOUNTER — Telehealth: Payer: Self-pay | Admitting: Nurse Practitioner

## 2024-07-15 ENCOUNTER — Ambulatory Visit: Admitting: Nurse Practitioner

## 2024-07-15 VITALS — BP 124/65 | HR 56 | Ht 62.0 in | Wt 157.8 lb

## 2024-07-15 DIAGNOSIS — J849 Interstitial pulmonary disease, unspecified: Secondary | ICD-10-CM | POA: Diagnosis not present

## 2024-07-15 DIAGNOSIS — G4719 Other hypersomnia: Secondary | ICD-10-CM

## 2024-07-15 DIAGNOSIS — I272 Pulmonary hypertension, unspecified: Secondary | ICD-10-CM

## 2024-07-15 DIAGNOSIS — R0600 Dyspnea, unspecified: Secondary | ICD-10-CM

## 2024-07-15 DIAGNOSIS — R0609 Other forms of dyspnea: Secondary | ICD-10-CM | POA: Diagnosis not present

## 2024-07-15 NOTE — Telephone Encounter (Signed)
 Echocardiogram unremarkable but unable to measure pulmonary artery pressures. She still has DOE. CTA without acute worsening/PE/infection. Given decline in DLCO and worsening symptoms, remains worrisome for PH. Spoke with pt and family about RHC. She is willing to move forward with this. Would she be a candidate for the clinical trial? If not, I will refer to HF team to perform RHC. Thanks!

## 2024-07-15 NOTE — Patient Instructions (Signed)
 Your recent workup was relatively unrevealing. I am still concerned you may have pulmonary hypertension given your worsening symptoms without significant change in your fibrosis on imaging. We will plan to get you set up with a right heart catheterization, either through a clinical trial with [employer] or we will go through your insurance. Dr. Mclean or Dr. Bensimhon with the heart failure team will perform this. We will contact you regarding next steps   Call us  if you haven't heard something about your overnight oxygen study by end of next week Depending on the results, we may have you complete a sleep study to rule out sleep apnea given your symptoms  Follow up as scheduled with Dr. Geronimo after 30 minute PFT. If symptoms do not improve or worsen, please contact office for sooner follow up or seek emergency care.

## 2024-07-15 NOTE — Progress Notes (Signed)
 @Patient  ID: Kristin Bush, female    DOB: 31-May-1951, 73 y.o.   MRN: [id number]  Chief Complaint  Patient presents with   Medical Management of Chronic Issues    Pt states still SOB     Referring provider: Rosamond Leta NOVAK, MD  HPI: 73 year old female, never smoker followed for ILD and family history of pulmonary fibrosis. She is a patient of Dr. Reeves and last seen in office 06/29/2024. Past medical history significant for HTN, anxiety.   TEST/EVENTS:  10/09/2023 PFT: FVC 54, FEV1 48, ratio 77, TLC 56, DLCO 66. No BD 11/10/2023 HRCT chest: atherosclerosis. Stable ILD, NSIP vs UIP, indeterminate. 7 mm calcified splenic artery aneurysm 01/30/2024 PFT: FVC 59, FEV1 58, ratio 74, DLCO 111 06/29/2024 PFT: FVC 56, FEV1 50, ratio 67, DLCOcor 51 07/01/2024 echo: EF 55-60%. Mild LVH. RV size and function nl. Trivial MR.  07/02/2024 CTA chest: no PE. Mild interstitial thickening and ground glass opacities. Low lung volumes  06/29/2024: OV with Dr. Geronimo. ILD with family history; NSIP by imaging. Genetic testing with PARN gene. Started Ofev March 2025 with significant side effects. Stopped August 2025. Some 4-6 weeks ago started feeling like she was suffocating at night. For 3-4 weeks insidious onset of DOE. Difficulty doing groceries, walking from car to home. No exertional hypoxia. PFT shows stable FVC but reduced DLCO. Significant varicose veins at baseline. Ordered ONO on room air. D dimer and BNP ordered. Echocardiogram. May consider RHC pending results. Consider pulmonary rehab in future. Repeat spiro/DLCO in 3 months.   07/15/2024: Today - follow up Discussed the use of AI scribe software for clinical note transcription with the patient, who gave verbal consent to proceed.  History of Present Illness Kristin Bush is a 73 year old female with pulmonary fibrosis who presents for follow up.   She has been experiencing increased shortness of breath over the last several months,  which worsens when she lies down at night. She feels unchanged compared to her last visit. No significant cough. No wheezing, chest tightness/pain, syncope, palpitations, leg swelling.   She had a CTA chest that was negative for PE or acute process. No obvious progression of ILD. She had a repeat PFT that showed stable FVC but decline in DLCO compared to prior. Her echocardiogram was unremarkable; unable to measure pulmonary artery pressures.   She has no known history of sleep apnea and does not snore to her knowledge. An overnight oxygen study was ordered to assess for oxygen level drops and respiratory disturbances during sleep, but the results are not yet available. She feels tired during the day, regardless of how long she sleeps. No morning headaches, sleep parasomnias/paralysis. No drowsy driving. No sleep aids.     Allergies  Allergen Reactions   Nintedanib     Abdominal cramps    Immunization History  Administered Date(s) Administered   Fluad Quad(high Dose 65+) 06/15/2024   Fluad Trivalent(High Dose 65+) 06/11/2023   Td (Adult),5 Lf Tetanus Toxid, Preservative Free 07/07/1998    Past Medical History:  Diagnosis Date   Arthritis    Depression    Dyspnea    Family history of pancreatic cancer    Hypertension    Pulmonary fibrosis (HCC) 05/2023   SI (sacroiliac) joint dysfunction    Spondylolisthesis    Wears glasses    Wears partial dentures     Tobacco History: Social History   Tobacco Use  Smoking Status Never  Smokeless Tobacco Never  Counseling given: Not Answered   Outpatient Medications Prior to Visit  Medication Sig Dispense Refill   busPIRone (BUSPAR) 5 MG tablet Take 5 mg by mouth 2 (two) times daily.     gabapentin (NEURONTIN) 300 MG capsule Take 300 mg by mouth 2 (two) times daily.     Homeopathic Products (PROSACEA) GEL Apply 1 application topically daily as needed (rosacea). (Patient not taking: Reported on 06/29/2024)     meloxicam  (MOBIC ) 7.5  MG tablet Take 7.5 mg by mouth daily.     OFEV 150 MG CAPS TAKE 1 CAPSULE BY MOUTH TWICE DAILY (12 HOURS APART) WITH FOOD (Patient not taking: Reported on 06/29/2024) 180 capsule 1   ondansetron  (ZOFRAN ) 4 MG tablet Take 1 tablet (4 mg total) by mouth every 6 (six) hours. (Patient not taking: Reported on 06/29/2024) 12 tablet 0   propranolol (INDERAL) 20 MG tablet Take 20 mg by mouth 3 (three) times daily.     traMADol (ULTRAM) 50 MG tablet Take 50 mg by mouth 2 (two) times daily as needed.     triamterene -hydrochlorothiazide  (DYAZIDE ) 37.5-25 MG capsule Take 1 tablet by mouth daily.      No facility-administered medications prior to visit.     Review of Systems: as above     Physical Exam:  BP 124/65   Pulse (!) 56   Ht 5' 2 (1.575 m) Comment: per pt  Wt 157 lb 12.8 oz (71.6 kg)   SpO2 98%   BMI 28.86 kg/m   GEN: Pleasant, interactive, well-appearing; in no acute distress HEENT:  Normocephalic and atraumatic. PERRLA. Sclera white. Nasal turbinates pink, moist and patent bilaterally. No rhinorrhea present. Oropharynx pink and moist, without exudate or edema. No lesions, ulcerations, or postnasal drip.  NECK:  Supple w/ fair ROM. No JVD present. No lymphadenopathy.   CV: RRR, no m/r/g, no peripheral edema. Pulses intact, +2 bilaterally. No cyanosis, pallor or clubbing. PULMONARY:  Unlabored, regular breathing. Bibasilar crackles otherwise clear bilaterally A&P w/o wheezes/rales/rhonchi. No accessory muscle use.  GI: BS present and normoactive. Soft, non-tender to palpation.  MSK: No erythema, warmth or tenderness. Cap refil <2 sec all extrem.  Neuro: A/Ox3. No focal deficits noted.   Skin: Warm, no lesions or rashe Psych: Normal affect and behavior. Judgement and thought content appropriate.     Lab Results:  CBC    Component Value Date/Time   WBC 8.7 06/17/2024 0706   RBC 4.56 06/17/2024 0706   HGB 13.5 06/17/2024 0706   HGB 14.1 08/12/2023 1439   HCT 41.0 06/17/2024  0706   HCT 44.4 08/12/2023 1439   PLT 256 06/17/2024 0706   PLT 363 08/12/2023 1439   MCV 89.9 06/17/2024 0706   MCV 87 08/12/2023 1439   MCH 29.6 06/17/2024 0706   MCHC 32.9 06/17/2024 0706   RDW 12.8 06/17/2024 0706   RDW 12.9 08/12/2023 1439   LYMPHSABS 0.3 (L) 06/17/2024 0706   LYMPHSABS 1.0 08/12/2023 1439   MONOABS 0.7 06/17/2024 0706   EOSABS 0.1 06/17/2024 0706   EOSABS 0.3 08/12/2023 1439   BASOSABS 0.0 06/17/2024 0706   BASOSABS 0.1 08/12/2023 1439    BMET    Component Value Date/Time   NA 137 06/17/2024 0706   K 3.4 (L) 06/17/2024 0706   CL 99 06/17/2024 0706   CO2 27 06/17/2024 0706   GLUCOSE 117 (H) 06/17/2024 0706   BUN 19 06/17/2024 0706   CREATININE 0.78 06/17/2024 0706   CALCIUM 10.2 06/17/2024 0706   GFRNONAA >60  06/17/2024 0706   GFRAA >60 11/15/2019 1427    BNP    Component Value Date/Time   BNP 15.7 08/12/2023 1439     Imaging:  CT Angio Chest Pulmonary Embolism (PE) W or WO Contrast Result Date: 07/02/2024 EXAM: CTA of the Chest with contrast for PE 07/02/2024 03:59:43 PM TECHNIQUE: CTA of the chest was performed after the administration of intravenous contrast. Without and with IV contrast was administered, including 75 mL iohexol  (OMNIPAQUE ) 350 MG/ML injection. Multiplanar reformatted images are provided for review. MIP images are provided for review. Automated exposure control, iterative reconstruction, and/or weight based adjustment of the mA/kV was utilized to reduce the radiation dose to as low as reasonably achievable. COMPARISON: High resolution CT chest 11/10/2023. CLINICAL HISTORY: elevated D-dimer. Chronic sob. Elevated d-dimer. Recent fall. FINDINGS: PULMONARY ARTERIES: Pulmonary arteries are adequately opacified for evaluation. No evidence of acute pulmonary embolism. Main pulmonary artery is normal in caliber. MEDIASTINUM: The heart and pericardium demonstrate no acute abnormality. There is no acute abnormality of the thoracic aorta.  LYMPH NODES: No mediastinal, hilar or axillary lymphadenopathy. LUNGS AND PLEURA: There is mild interstitial thickening and ground glass opacities throughout the lung, suggesting mild pulmonary edema. Low lung volumes noted. No pleural effusion. No pneumonia or pneumothorax. UPPER ABDOMEN: Limited images of the upper abdomen are unremarkable. SOFT TISSUES AND BONES: No acute bone or soft tissue abnormality. IMPRESSION: 1. No evidence of acute pulmonary embolism. 2. Mild interstitial thickening and ground glass opacities throughout the lungs, suggesting mild pulmonary edema. No pleural effusion, pneumonia, or pneumothorax. Low lung volumes. Electronically signed by: Norleen Boxer MD 07/02/2024 04:27 PM EDT RP Workstation: [id number]   ECHOCARDIOGRAM COMPLETE Result Date: 07/01/2024    ECHOCARDIOGRAM REPORT   Patient Name:   Kristin Bush Date of Exam: 07/01/2024 Medical Rec #:  [id number]        Height:       60.2 in Accession #:    [id number]       Weight:       155.6 lb Date of Birth:  Dec 25, 1950         BSA:          1.683 m Patient Age:    73 years         BP:           116/70 mmHg Patient Gender: F                HR:           71 bpm. Exam Location:  [address] Procedure: 2D Echo, Cardiac Doppler and Color Doppler (Both Spectral and Color            Flow Doppler were utilized during procedure). Indications:    R06.00 SOB  History:        Patient has prior history of Echocardiogram examinations, most                 recent 06/09/2023. Signs/Symptoms:Shortness of Breath and ILD.  Sonographer:    Elsie Bohr RDCS Referring Phys: [phone] [facility]  Sonographer Comments: Technically difficult study due to poor echo windows and Technically challenging study due to limited acoustic windows. IMPRESSIONS  1. Difficult apical windows.  2. Left ventricular ejection fraction, by estimation, is 55 to 60%. The left ventricle has normal function. The left ventricle has no regional wall motion abnormalities. There  is mild concentric left ventricular hypertrophy. Left ventricular diastolic parameters were normal.  3. Right ventricular systolic function  is normal. The right ventricular size is normal.  4. The mitral valve is normal in structure. Trivial mitral valve regurgitation.  5. The aortic valve is tricuspid. Aortic valve regurgitation is not visualized.  6. The inferior vena cava is normal in size with greater than 50% respiratory variability, suggesting right atrial pressure of 3 mmHg. FINDINGS  Left Ventricle: Left ventricular ejection fraction, by estimation, is 55 to 60%. The left ventricle has normal function. The left ventricle has no regional wall motion abnormalities. The left ventricular internal cavity size was normal in size. There is  mild concentric left ventricular hypertrophy. Left ventricular diastolic parameters were normal. Right Ventricle: The right ventricular size is normal. Right vetricular wall thickness was not assessed. Right ventricular systolic function is normal. Left Atrium: Left atrial size was normal in size. Right Atrium: Right atrial size was normal in size. Pericardium: There is no evidence of pericardial effusion. Mitral Valve: The mitral valve is normal in structure. Trivial mitral valve regurgitation. Tricuspid Valve: The tricuspid valve is normal in structure. Tricuspid valve regurgitation is trivial. Aortic Valve: The aortic valve is tricuspid. Aortic valve regurgitation is not visualized. Pulmonic Valve: The pulmonic valve was not well visualized. Aorta: The aortic root and ascending aorta are structurally normal, with no evidence of dilitation. Venous: The inferior vena cava is normal in size with greater than 50% respiratory variability, suggesting right atrial pressure of 3 mmHg. IAS/Shunts: No atrial level shunt detected by color flow Doppler.  LEFT VENTRICLE PLAX 2D LVIDd:         3.90 cm   Diastology LVIDs:         2.80 cm   LV e' medial:    8.81 cm/s LV PW:         1.20 cm    LV E/e' medial:  8.9 LV IVS:        1.20 cm   LV e' lateral:   8.81 cm/s LVOT diam:     1.90 cm   LV E/e' lateral: 8.9 LV SV:         59 LV SV Index:   35 LVOT Area:     2.84 cm  IVC IVC diam: 1.10 cm LEFT ATRIUM             Index        RIGHT ATRIUM LA diam:        3.30 cm 1.96 cm/m   RA Pressure: 3.00 mmHg LA Vol (A2C):   24.7 ml 14.68 ml/m LA Vol (A4C):   31.7 ml 18.84 ml/m LA Biplane Vol: 30.3 ml 18.01 ml/m  AORTIC VALVE LVOT Vmax:   109.00 cm/s LVOT Vmean:  70.400 cm/s LVOT VTI:    0.208 m  AORTA Ao Root diam: 3.10 cm Ao Asc diam:  3.10 cm MITRAL VALVE               TRICUSPID VALVE MV Area (PHT): 2.76 cm    Estimated RAP:  3.00 mmHg MV Decel Time: 275 msec MV E velocity: 78.60 cm/s  SHUNTS MV A velocity: 96.90 cm/s  Systemic VTI:  0.21 m MV E/A ratio:  0.81        Systemic Diam: 1.90 cm Vina Gull MD Electronically signed by Vina Gull MD Signature Date/Time: 07/01/2024/7:36:14 PM    Final    DG Sacrum/Coccyx Result Date: 06/17/2024 EXAM: 3 VIEW(S) XRAY OF THE SACRUM AND COCCYX 06/17/2024 06:47:00 AM COMPARISON: CT abdomen and pelvis 04/10/2024. CLINICAL HISTORY: 73 year old female. Questionable  sepsis, fall with tailbone injury, tailbone pain, weakness. FINDINGS: BONES AND JOINTS: No acute fracture. No focal osseous lesion. No joint dislocation. Chronic multilevel previous lumbar posterior and interbody fusion. Previous right SI joint arthrodesis hardware placement. Hardware appears stable. SOFT TISSUES: Nonobstructive bowel gas pattern. The soft tissues are unremarkable. IMPRESSION: 1. No acute osseous abnormality identified at the sacrum or coccyx. 2. Previous multilevel lumbar fusion and right SI joint arthrodesis. Electronically signed by: Helayne Hurst MD 06/17/2024 06:58 AM EDT RP Workstation: [id number]   DG Chest 2 View Result Date: 06/17/2024 EXAM: 2 VIEW(S) XRAY OF THE CHEST 06/17/2024 06:47:00 AM COMPARISON: Chest CT dated 11/10/2023 and earlier. CLINICAL HISTORY: Questionable sepsis -  evaluate for abnormality; also fall with tailbone injury. Fell around 3a this morning. Tailbone pain, weakness. Hx of pulmonary fibrosis. Fever of 101. FINDINGS: LUNGS AND PLEURA: Chronic interstitial lung disease as demonstrated by CT this year. Lower lung volumes compared to last year. No focal pulmonary opacity. No pulmonary edema. No pleural effusion. No pneumothorax. HEART AND MEDIASTINUM: Stable mediastinal contours. No acute abnormality of the cardiac silhouette. BONES AND SOFT TISSUES: Chronic postoperative changes right humeral head. Negative visible bowel gas. No acute osseous abnormality. IMPRESSION: 1. Chronic interstitial lung disease with decreased lung volumes since last year. 2. No acute cardiopulmonary process or acute traumatic injury identified Electronically signed by: Helayne Hurst MD 06/17/2024 06:57 AM EDT RP Workstation: [id number]   CT Cervical Spine Wo Contrast Result Date: 06/17/2024 EXAM: CT CERVICAL SPINE WITHOUT CONTRAST 06/17/2024 06:32:46 AM TECHNIQUE: CT of the cervical spine was performed without the administration of intravenous contrast. Multiplanar reformatted images are provided for review. Automated exposure control, iterative reconstruction, and/or weight based adjustment of the mA/kV was utilized to reduce the radiation dose to as low as reasonably achievable. COMPARISON: Head CT 06/17/2024. CLINICAL HISTORY: 73 year old female with neck trauma after a fall, complaining of tailbone pain and nausea. FINDINGS: CERVICAL SPINE: BONES AND ALIGNMENT: Maintained cervical lordosis. Subtle degenerative anterolisthesis of C7 on T1 with associated degenerative facet ankylosis there on the right. No acute fracture or traumatic malalignment. DEGENERATIVE CHANGES: Advanced chronic cervical disc and endplate degeneration C4-C5 and into the visible upper thoracic spine. No significant cervical spinal stenosis by CT. SOFT TISSUES: Negative visible noncontrast neck soft tissues. Mild  calcified atherosclerosis of the left carotid artery. Apical mild lung scarring and possible emphysema. IMPRESSION: 1. No acute traumatic injury identified in the cervical spine. 2. Advanced chronic degeneration, no significant cervical spinal stenosis by CT. Electronically signed by: Helayne Hurst MD 06/17/2024 06:40 AM EDT RP Workstation: [id number]   CT Head Wo Contrast Result Date: 06/17/2024 EXAM: CT HEAD WITHOUT CONTRAST 06/17/2024 06:32:46 AM TECHNIQUE: CT of the head was performed without the administration of intravenous contrast. Automated exposure control, iterative reconstruction, and/or weight based adjustment of the mA/kV was utilized to reduce the radiation dose to as low as reasonably achievable. COMPARISON: None available. CLINICAL HISTORY: 73 year old female. Head trauma, minor. Fall with head injury and nausea. FINDINGS: BRAIN AND VENTRICLES: No acute hemorrhage. No evidence of acute infarct. No hydrocephalus. No extra-axial collection. No mass effect or midline shift. Cavum septum lucidum, normal variant. Normal brain volume per age. Patchy bilateral periventricular white matter hypodensity is moderate for age, asymmetrically greater in the left hemisphere. Otherwise maintained gray white differentiation. Calcified atherosclerosis at the skull base. No suspicious intracranial vascular hyperdensity. ORBITS: Postoperative changes to both globes. SINUSES: No acute abnormality. SOFT TISSUES AND SKULL: No acute soft tissue abnormality. No skull fracture.  IMPRESSION: 1. No acute traumatic injury identified 2. Moderate for age cerebral white matter changes most commonly due to chronic small vessel disease. Electronically signed by: Helayne Hurst MD 06/17/2024 06:37 AM EDT RP Workstation: [id number]    Administration History     None          Latest Ref Rng & Units 06/29/2024    2:39 PM 01/30/2024   12:36 PM 10/09/2023   12:51 PM  PFT Results  FVC-Pre L 1.42  1.52  1.38   FVC-Predicted Pre  % 56  59  54   FVC-Post L   1.40   FVC-Predicted Post %   55   Pre FEV1/FVC % % 67  74  68   Post FEV1/FCV % %   77   FEV1-Pre L 0.95  1.12  0.94   FEV1-Predicted Pre % 50  58  48   FEV1-Post L   1.08   DLCO uncorrected ml/min/mmHg 8.87  19.11  11.63   DLCO UNC% % 52  111  68   DLCO corrected ml/min/mmHg 8.84   11.40   DLCO COR %Predicted % 51   66   DLVA Predicted % 94  105  113   TLC L   2.53   TLC % Predicted %   56   RV % Predicted %   61     No results found for: NITRICOXIDE      Assessment & Plan:   Assessment & Plan Suspected pulmonary hypertension in the setting of pulmonary fibrosis Insidious onset of worsening shortness of breath over several months. Recent workup unrevealing. Concern for pulmonary hypertension related to interstitial lung disease given disproportionate DOE compared to lung function, decline in DLCO, and no obvious worsening of ILD on imaging. Right heart catheterization is needed to directly measure pulmonary artery pressures. Discussed potential participation in a clinical trial for right heart catheterization, which would cover the procedure without insurance. If not eligible, the procedure will be billed through insurance and completed by HF team. Risks reviewed. Pt agreeable to plan. Euvolemic on exam.  - Will discuss with Dr. Geronimo about eligibility for clinical trial for right heart catheterization. - If not eligible for clinical trial, will refer to Dr. Mclean or Dr. Bensimhon for right heart catheterization  - Will monitor for overnight oxygen study results   Shortness of breath and fatigue Potentially related to suspected pulmonary hypertension. Possible component of sleep apnea given nocturnal symptoms and daytime fatigue. No prior sleep study. Will await ONO results and determine appropriateness of HST for further evaluation. Reviewed risks of untreated OSA. Safe driving practices reviewed  - Await results of overnight oxygen study. -  If overnight oxygen study shows significant oxygen drops or respiratory disturbances, will consider home sleep study to rule out sleep apnea.  ILD No evidence of progression based on FVC and CT imaging. See above plan. May want to consider alternative antifibrotic in the future. Intolerant of Ofev in past. Consider referral to pulmonary rehab pending workup.  - Attend PFT at follow up - See above    Advised if symptoms do not improve or worsen, to please contact office for sooner follow up or seek emergency care.   I spent 35 minutes of dedicated to the care of this patient on the date of this encounter to include pre-visit review of records, face-to-face time with the patient discussing conditions above, post visit ordering of testing, clinical documentation with the electronic health record, making appropriate referrals as  documented, and communicating necessary findings to members of the patients care team.  Comer LULLA Rouleau, NP 07/15/2024  Pt aware and understands NP's role.

## 2024-07-16 DIAGNOSIS — J849 Interstitial pulmonary disease, unspecified: Secondary | ICD-10-CM | POA: Diagnosis not present

## 2024-07-19 ENCOUNTER — Encounter: Payer: Self-pay | Admitting: Internal Medicine

## 2024-07-21 NOTE — Progress Notes (Signed)
Echo is normal

## 2024-07-29 NOTE — Telephone Encounter (Signed)
  Given fact dLCO decline > FVC decline good idea to get RHC. The research team is swamped due to call out . So pleae get it as SOC. Thanks for seeing her      Latest Ref Rng & Units 06/29/2024    2:39 PM 01/30/2024   12:36 PM 10/09/2023   12:51 PM  PFT Results  FVC-Pre L 1.42  1.52  1.38   FVC-Predicted Pre % 56  59  54   FVC-Post L   1.40   FVC-Predicted Post %   55   Pre FEV1/FVC % % 67  74  68   Post FEV1/FCV % %   77   FEV1-Pre L 0.95  1.12  0.94   FEV1-Predicted Pre % 50  58  48   FEV1-Post L   1.08   DLCO uncorrected ml/min/mmHg 8.87  19.11  11.63   DLCO UNC% % 52  111  68   DLCO corrected ml/min/mmHg 8.84   11.40   DLCO COR %Predicted % 51   66   DLVA Predicted % 94  105  113   TLC L   2.53   TLC % Predicted %   56   RV % Predicted %   61

## 2024-07-30 NOTE — Telephone Encounter (Signed)
 Please let pt know that I spoke to Dr. Geronimo and we will get her set up for a RHC with the heart team. Either Dr. Mclean or Dr. Bensimhon. Someone from their office will contact her to schedule this.

## 2024-08-02 NOTE — Telephone Encounter (Signed)
Sent communication via [employer].

## 2024-08-13 ENCOUNTER — Other Ambulatory Visit (HOSPITAL_COMMUNITY): Payer: Self-pay | Admitting: *Deleted

## 2024-08-13 ENCOUNTER — Telehealth (HOSPITAL_COMMUNITY): Payer: Self-pay | Admitting: *Deleted

## 2024-08-13 DIAGNOSIS — J849 Interstitial pulmonary disease, unspecified: Secondary | ICD-10-CM

## 2024-08-13 NOTE — Telephone Encounter (Signed)
 Called patient to schedule right heart catheterization with Dr. Rolan per Pulmonology referral. RHC scheduled, request for insurance authorization sent to Pulmonology office.    Procedure instructions sent to patient via MyChart per patient request with instructions to call us  with any questions, cancellations, or re-scheduling requests.

## 2024-08-25 ENCOUNTER — Ambulatory Visit (HOSPITAL_COMMUNITY)
Admission: RE | Admit: 2024-08-25 | Discharge: 2024-08-25 | Disposition: A | Discharge status: Home / Self Care | Attending: Cardiology | Admitting: Cardiology

## 2024-08-25 ENCOUNTER — Other Ambulatory Visit: Payer: Self-pay | Admitting: Orthopedic Surgery

## 2024-08-25 ENCOUNTER — Encounter (HOSPITAL_COMMUNITY): Admission: RE | Disposition: A | Payer: Self-pay | Attending: Cardiology

## 2024-08-25 DIAGNOSIS — I272 Pulmonary hypertension, unspecified: Secondary | ICD-10-CM

## 2024-08-25 DIAGNOSIS — Z79899 Other long term (current) drug therapy: Secondary | ICD-10-CM | POA: Diagnosis not present

## 2024-08-25 DIAGNOSIS — J849 Interstitial pulmonary disease, unspecified: Secondary | ICD-10-CM

## 2024-08-25 DIAGNOSIS — J8410 Pulmonary fibrosis, unspecified: Secondary | ICD-10-CM | POA: Diagnosis present

## 2024-08-25 DIAGNOSIS — I2721 Secondary pulmonary arterial hypertension: Secondary | ICD-10-CM | POA: Diagnosis not present

## 2024-08-25 DIAGNOSIS — M5416 Radiculopathy, lumbar region: Secondary | ICD-10-CM

## 2024-08-25 HISTORY — PX: RIGHT HEART CATH: CATH118263

## 2024-08-25 LAB — CBC
HCT: 42.4 % (ref 36.0–46.0)
Hemoglobin: 13.6 g/dL (ref 12.0–15.0)
MCH: 28.3 pg (ref 26.0–34.0)
MCHC: 32.1 g/dL (ref 30.0–36.0)
MCV: 88.1 fL (ref 80.0–100.0)
Platelets: 304 K/uL (ref 150–400)
RBC: 4.81 MIL/uL (ref 3.87–5.11)
RDW: 12.9 % (ref 11.5–15.5)
WBC: 6.9 K/uL (ref 4.0–10.5)
nRBC: 0 % (ref 0.0–0.2)

## 2024-08-25 LAB — BASIC METABOLIC PANEL WITH GFR
Anion gap: 10 (ref 5–15)
BUN: 23 mg/dL (ref 8–23)
CO2: 28 mmol/L (ref 22–32)
Calcium: 10 mg/dL (ref 8.9–10.3)
Chloride: 100 mmol/L (ref 98–111)
Creatinine, Ser: 0.81 mg/dL (ref 0.44–1.00)
GFR, Estimated: >60 mL/min (ref >60)
Glucose, Bld: 94 mg/dL (ref 70–99)
Potassium: 3.8 mmol/L (ref 3.5–5.1)
Sodium: 138 mmol/L (ref 135–145)

## 2024-08-25 LAB — POCT I-STAT EG7
Acid-Base Excess: 1 mmol/L (ref 0.0–2.0)
Acid-Base Excess: 1 mmol/L (ref 0.0–2.0)
Bicarbonate: 26 mmol/L (ref 20.0–28.0)
Bicarbonate: 26.8 mmol/L (ref 20.0–28.0)
Calcium, Ion: 1.17 mmol/L (ref 1.15–1.40)
Calcium, Ion: 1.25 mmol/L (ref 1.15–1.40)
HCT: 38 % (ref 36.0–46.0)
HCT: 38 % (ref 36.0–46.0)
Hemoglobin: 12.9 g/dL (ref 12.0–15.0)
Hemoglobin: 12.9 g/dL (ref 12.0–15.0)
O2 Saturation: 68 %
O2 Saturation: 70 %
Potassium: 3.7 mmol/L (ref 3.5–5.1)
Potassium: 3.9 mmol/L (ref 3.5–5.1)
Sodium: 141 mmol/L (ref 135–145)
Sodium: 139 mmol/L (ref 135–145)
TCO2: 27 mmol/L (ref 22–32)
TCO2: 28 mmol/L (ref 22–32)
pCO2, Ven: 43.7 mmHg — ABNORMAL LOW (ref 44–60)
pCO2, Ven: 44.9 mmHg (ref 44–60)
pH, Ven: 7.382 (ref 7.25–7.43)
pH, Ven: 7.385 (ref 7.25–7.43)
pO2, Ven: 36 mmHg (ref 32–45)
pO2, Ven: 37 mmHg (ref 32–45)

## 2024-08-25 SURGERY — RIGHT HEART CATH
Anesthesia: LOCAL

## 2024-08-25 MED ORDER — SODIUM CHLORIDE 0.9 % IV SOLN: 250.0000 mL | INTRAVENOUS | Status: DC | PRN

## 2024-08-25 MED ORDER — HYDRALAZINE HCL 20 MG/ML IJ SOLN: 10.0000 mg | INTRAMUSCULAR | Status: DC | PRN

## 2024-08-25 MED ORDER — SODIUM CHLORIDE 0.9% FLUSH: 3.0000 mL | Freq: Two times a day (BID) | INTRAVENOUS | Status: DC

## 2024-08-25 MED ORDER — ASPIRIN 81 MG PO CHEW: 81.0000 mg | CHEWABLE_TABLET | ORAL | Status: DC

## 2024-08-25 MED ORDER — SODIUM CHLORIDE 0.9% FLUSH: 3.0000 mL | INTRAVENOUS | Status: DC | PRN

## 2024-08-25 MED ORDER — LIDOCAINE HCL (PF) 1 % IJ SOLN
INTRAMUSCULAR | Status: DC | PRN
  Administered 2024-08-25: 14:00:00 2 mL

## 2024-08-25 MED ORDER — LABETALOL HCL 5 MG/ML IV SOLN: 10.0000 mg | INTRAVENOUS | Status: DC | PRN

## 2024-08-25 MED ORDER — FREE WATER: 250.0000 mL | Freq: Once | Status: DC

## 2024-08-25 MED ORDER — HEPARIN (PORCINE) IN NACL 1000-0.9 UT/500ML-% IV SOLN
INTRAVENOUS | Status: DC | PRN
  Administered 2024-08-25: 14:00:00 500 mL

## 2024-08-25 MED ORDER — LIDOCAINE HCL (PF) 1 % IJ SOLN
INTRAMUSCULAR | Status: AC
  Filled 2024-08-25: qty 30

## 2024-08-25 MED ORDER — ACETAMINOPHEN 325 MG PO TABS: 650.0000 mg | ORAL_TABLET | ORAL | Status: DC | PRN

## 2024-08-25 MED ORDER — ONDANSETRON HCL 4 MG/2ML IJ SOLN: 4.0000 mg | Freq: Four times a day (QID) | INTRAMUSCULAR | Status: DC | PRN

## 2024-08-25 SURGICAL SUPPLY — 5 items
CATH BALLN WEDGE 5F 110CM (CATHETERS) IMPLANT
PACK CARDIAC CATHETERIZATION (CUSTOM PROCEDURE TRAY) ×1 IMPLANT
SHEATH GLIDE SLENDER 4/5FR (SHEATH) IMPLANT
TRANSDUCER W/MONITORING KIT (MISCELLANEOUS) IMPLANT
TUBING ART PRESS 72 MALE/FEM (TUBING) IMPLANT

## 2024-08-25 NOTE — H&P (Signed)
 Advanced Heart Failure Team History and Physical Note   PCP:  Rosamond Leta NOVAK, MD  PCP-Cardiology: Vishnu P Mallipeddi, MD     Reason for Admission: RHC HPI:    Kristin Bush is a 73 y.o. female with ILD referred by Dr. Geronimo for RHC today to assess for PH-ILD.    Home Medications Prior to Admission medications  Medication Sig Start Date End Date Taking? Authorizing Provider  busPIRone (BUSPAR) 5 MG tablet Take 5 mg by mouth 2 (two) times daily. 08/05/23  Yes [provider]  Chlorpheniramine-Phenylephrine  ([facility] SINUS PE/ALLERGY MAX ST PO) Take 1 tablet by mouth every 4 (four) hours.   Yes [provider]  gabapentin (NEURONTIN) 300 MG capsule Take 300 mg by mouth 2 (two) times daily. 05/21/23  Yes [provider]  meloxicam  (MOBIC ) 7.5 MG tablet Take 7.5 mg by mouth daily. 03/22/23  Yes [provider]  Menthol -Methyl Salicylate (MUSCLE RUB) 10-15 % CREA Apply 1 Application topically daily as needed for muscle pain.   Yes [provider]  propranolol (INDERAL) 20 MG tablet Take 20 mg by mouth 3 (three) times daily. 06/18/24  Yes [provider]  traMADol (ULTRAM) 50 MG tablet Take 50 mg by mouth 2 (two) times daily as needed. 05/21/23  Yes [provider]  triamterene -hydrochlorothiazide  (DYAZIDE ) 37.5-25 MG capsule Take 1 tablet by mouth daily.    Yes [provider]  Homeopathic Products (PROSACEA) GEL Apply 1 application topically daily as needed (rosacea). Patient not taking: No sig reported    [provider]  ondansetron  (ZOFRAN ) 4 MG tablet Take 1 tablet (4 mg total) by mouth every 6 (six) hours. Patient not taking: No sig reported 04/10/24   Cleotilde Rogue, MD    Past Medical History: Past Medical History:  Diagnosis Date   Arthritis    Depression    Dyspnea    Family history of pancreatic cancer    Hypertension    Pulmonary fibrosis (HCC) 05/2023   SI (sacroiliac) joint dysfunction     Spondylolisthesis    Wears glasses    Wears partial dentures     Past Surgical History: Past Surgical History:  Procedure Laterality Date   ABDOMINAL HYSTERECTOMY     BACK SURGERY  05/28/2018   PLIF   CATARACT EXTRACTION W/PHACO Left 10/06/2014   Procedure: CATARACT EXTRACTION PHACO AND INTRAOCULAR LENS PLACEMENT LEFT EYE;  Surgeon: Cherene Mania, MD;  Location: [facility];  Service: Ophthalmology;  Laterality: Left;  CDE:5.49   CATARACT EXTRACTION W/PHACO Right 10/17/2014   Procedure: CATARACT EXTRACTION PHACO AND INTRAOCULAR LENS PLACEMENT RIGHT EYE CDE=9.81;  Surgeon: Cherene Mania, MD;  Location: [facility];  Service: Ophthalmology;  Laterality: Right;   COLONOSCOPY N/A 03/24/2024   Procedure: COLONOSCOPY;  Surgeon: Eartha Angelia Sieving, MD;  Location: [facility];  Service: Gastroenterology;  Laterality: N/A;  9:00 am, asa 1/2   MULTIPLE TOOTH EXTRACTIONS     ROTATOR CUFF REPAIR     right shoulder   SACROILIAC JOINT FUSION Right 11/18/2019   Procedure: RIGHT SACROILIAC JOINT FUSION;  Surgeon: Beuford Anes, MD;  Location: [facility];  Service: Orthopedics;  Laterality: Right;   SHOULDER ARTHROSCOPY WITH SUBACROMIAL DECOMPRESSION, ROTATOR CUFF REPAIR AND BICEP TENDON REPAIR Left 09/17/2018   Procedure: LEFT SHOULDER ARTHROSCOPY WITH DEBRIDEMENT, SUBACROMIAL DECOMPRESSION, ROTATOR CUFF;  Surgeon: Cristy Bonner DASEN, MD;  Location: [facility];  Service: Orthopedics;  Laterality: Left;   TRANSFORAMINAL LUMBAR INTERBODY FUSION (TLIF) WITH PEDICLE SCREW FIXATION 1  LEVEL Right 08/09/2021   Procedure: RIGHT-SIDED LUMBAR 3 - LUMBAR 4 TRANSFORAMINAL LUMBAR INTERBODY FUSION WITH INSTRUMENTATION AND ALLOGRAFT;  Surgeon: Beuford Anes, MD;  Location: [facility];  Service: Orthopedics;  Laterality: Right;   TUBAL LIGATION      Family History:  Family History  Problem Relation Age of Onset   Pulmonary fibrosis Mother 5   Aneurysm Father 3   Diabetes Sister    Hypertension Sister    Pulmonary  fibrosis Sister 59   Heart attack Brother        d. 39s   Pulmonary fibrosis Brother 70   Pulmonary fibrosis Brother    Esophageal cancer Brother    Pancreatic cancer Brother    COPD Maternal Aunt    Stroke Paternal Aunt    Pneumonia Maternal Grandmother    Kidney disease Maternal Grandfather    Stroke Paternal Grandmother    Pulmonary fibrosis Cousin        mat first cousin    Social History: Social History   Socioeconomic History   Marital status: Widowed    Spouse name: Not on file   Number of children: Not on file   Years of education: Not on file   Highest education level: Not on file  Occupational History   Not on file  Tobacco Use   Smoking status: Never   Smokeless tobacco: Never  Vaping Use   Vaping status: Never Used  Substance and Sexual Activity   Alcohol  use: No   Drug use: No   Sexual activity: Not on file  Other Topics Concern   Not on file  Social History Narrative   Not on file   Social Drivers of Health   Tobacco Use: Low Risk (07/15/2024)   Patient History    Smoking Tobacco Use: Never    Smokeless Tobacco Use: Never    Passive Exposure: Not on file  Financial Resource Strain: Not on file  Food Insecurity: Not on file  Transportation Needs: Not on file  Physical Activity: Not on file  Stress: Not on file  Social Connections: Not on file  Depression (EYV7-0): Not on file  Alcohol  Screen: Not on file  Housing: Not on file  Utilities: Not on file  Health Literacy: Not on file    Allergies:  Allergies[1]  Objective:    Vital Signs:   Temp:  [97.4 F (36.3 C)] 97.4 F (36.3 C) (12/17 1210) Pulse Rate:  [66] 66 (12/17 1210) Resp:  [16] 16 (12/17 1210) BP: (142)/(83) 142/83 (12/17 1210) SpO2:  [94 %-97 %] 94 % (12/17 1313) Weight:  [71.7 kg] 71.7 kg (12/17 1210)   Filed Weights   08/25/24 1210  Weight: 71.7 kg    Physical Exam    General: NAD Neck: No JVD, no thyromegaly or thyroid  nodule.  Lungs: Mild dry crackles at  bases.  CV: Nondisplaced PMI.  Heart regular S1/S2, no S3/S4, no murmur.  No peripheral edema.  No carotid bruit.  Normal pedal pulses.  Abdomen: Soft, nontender, no hepatosplenomegaly, no distention.  Skin: Intact without lesions or rashes.  Neurologic: Alert and oriented x 3.  Psych: Normal affect. Extremities: No clubbing or cyanosis.  HEENT: Normal.   Telemetry   NSR   Labs  Basic Metabolic Panel: Recent Labs  Lab 08/25/24 1225  NA 138  K 3.8  CL 100  CO2 28  GLUCOSE 94  BUN 23  CREATININE 0.81  CALCIUM 10.0    Liver Function Tests: No results for input(s): AST,  ALT, ALKPHOS, BILITOT, PROT, ALBUMIN in the last 168 hours. No results for input(s): LIPASE, AMYLASE in the last 168 hours. No results for input(s): AMMONIA in the last 168 hours.  CBC: Recent Labs  Lab 08/25/24 1226  WBC 6.9  HGB 13.6  HCT 42.4  MCV 88.1  PLT 304    Cardiac Enzymes: No results for input(s): CKTOTAL, CKMB, CKMBINDEX, TROPONINI in the last 168 hours.  BNP: BNP (last 3 results) No results for input(s): BNP in the last 8760 hours.  ProBNP (last 3 results) Recent Labs    06/29/24 1636  PROBNP 40.0     CBG: No results for input(s): GLUCAP in the last 168 hours.  Coagulation Studies: No results for input(s): LABPROT, INR in the last 72 hours.  Imaging: No results found.  Assessment/Plan   Patient with ILD here for RHC to assess for pulmonary hypertension.   Ezra Shuck, MD 08/25/2024, 1:25 PM  Advanced Heart Failure Team Pager [phone] (M-F; 7a - 5p)   Please visit [email or web address]: For overnight coverage please call cardiology fellow first. If fellow not available call Shock/ECMO MD on call.  For ECMO / Mechanical Support (Impella, IABP, LVAD) issues call Shock / ECMO MD on call.       [1]  Allergies Allergen Reactions   Nintedanib     Abdominal cramps

## 2024-08-25 NOTE — Discharge Instructions (Signed)

## 2024-08-25 NOTE — Progress Notes (Signed)
 Discharge instructions reviewed with patient and family member.  No bleeding noted from right brachial site dressing.

## 2024-08-26 ENCOUNTER — Encounter (HOSPITAL_COMMUNITY): Payer: Self-pay | Admitting: Cardiology

## 2024-08-27 ENCOUNTER — Encounter: Payer: Self-pay | Admitting: Internal Medicine

## 2024-09-15 NOTE — Discharge Instructions (Signed)

## 2024-09-16 ENCOUNTER — Other Ambulatory Visit

## 2024-09-16 ENCOUNTER — Ambulatory Visit
Admission: RE | Admit: 2024-09-16 | Discharge: 2024-09-16 | Disposition: A | Discharge status: Home / Self Care | Source: Ambulatory Visit | Attending: Orthopedic Surgery | Admitting: Orthopedic Surgery

## 2024-09-16 DIAGNOSIS — M5416 Radiculopathy, lumbar region: Secondary | ICD-10-CM

## 2024-09-16 MED ORDER — IOPAMIDOL (ISOVUE-M 200) INJECTION 41%
1.0000 mL | Freq: Once | INTRAMUSCULAR | Status: AC
  Administered 2024-09-16: 1 mL via EPIDURAL

## 2024-09-16 MED ORDER — METHYLPREDNISOLONE ACETATE 40 MG/ML INJ SUSP (RADIOLOG
80.0000 mg | Freq: Once | INTRAMUSCULAR | Status: AC
  Administered 2024-09-16: 80 mg via EPIDURAL

## 2024-09-17 ENCOUNTER — Other Ambulatory Visit

## 2024-09-21 ENCOUNTER — Ambulatory Visit: Admitting: *Deleted

## 2024-09-21 ENCOUNTER — Ambulatory Visit: Admitting: Internal Medicine

## 2024-09-21 ENCOUNTER — Encounter: Payer: Self-pay | Admitting: Internal Medicine

## 2024-09-21 VITALS — BP 122/74 | HR 61 | Ht 60.0 in | Wt 160.0 lb

## 2024-09-21 DIAGNOSIS — K5903 Drug induced constipation: Secondary | ICD-10-CM | POA: Diagnosis not present

## 2024-09-21 DIAGNOSIS — R0609 Other forms of dyspnea: Secondary | ICD-10-CM

## 2024-09-21 DIAGNOSIS — J849 Interstitial pulmonary disease, unspecified: Secondary | ICD-10-CM

## 2024-09-21 DIAGNOSIS — Z8719 Personal history of other diseases of the digestive system: Secondary | ICD-10-CM | POA: Diagnosis not present

## 2024-09-21 DIAGNOSIS — R0789 Other chest pain: Secondary | ICD-10-CM | POA: Diagnosis not present

## 2024-09-21 DIAGNOSIS — Z836 Family history of other diseases of the respiratory system: Secondary | ICD-10-CM

## 2024-09-21 DIAGNOSIS — R0902 Hypoxemia: Secondary | ICD-10-CM | POA: Diagnosis not present

## 2024-09-21 DIAGNOSIS — I2723 Pulmonary hypertension due to lung diseases and hypoxia: Secondary | ICD-10-CM | POA: Diagnosis not present

## 2024-09-21 DIAGNOSIS — I8393 Asymptomatic varicose veins of bilateral lower extremities: Secondary | ICD-10-CM

## 2024-09-21 DIAGNOSIS — J8489 Other specified interstitial pulmonary diseases: Secondary | ICD-10-CM

## 2024-09-21 LAB — PULMONARY FUNCTION TEST
DL/VA % pred: 118 %
DL/VA: 5.07 ml/min/mmHg/L
DLCO cor % pred: 57 %
DLCO cor: 9.72 ml/min/mmHg
DLCO unc % pred: 56 %
DLCO unc: 9.57 ml/min/mmHg
FEF 25-75 Pre: 1.24 L/s
FEF2575-%Pred-Pre: 76 %
FEV1-%Pred-Pre: 61 %
FEV1-Pre: 1.14 L
FEV1FVC-%Pred-Pre: 108 %
FEV6-%Pred-Pre: 57 %
FEV6-Pre: 1.37 L
FEV6FVC-%Pred-Pre: 105 %
FVC-%Pred-Pre: 55 %
FVC-Pre: 1.39 L
Pre FEV1/FVC ratio: 82 %
Pre FEV6/FVC Ratio: 100 %

## 2024-09-21 NOTE — Progress Notes (Signed)
 "      OV 08/12/2023 -new consult ILD center  Subjective:  Patient ID: Heron GORMAN Edelson, female , DOB: 02/17/1951 , age 74 y.o. , MRN: [id number] , ADDRESS: [address] [place] [address] PCP Rosamond Leta NOVAK, MD Patient Care Team: Rosamond Leta NOVAK, MD as PCP - General (Internal Medicine) Mallipeddi, Diannah SQUIBB, MD as PCP - Cardiology (Cardiology)  This Provider for this visit: Treatment Team:  Attending Provider: Mannam, Praveen, MD    08/12/2023 -   Chief Complaint  Patient presents with   Consult    Sob, coughing with clear with mucus, using otc just started yesterday, having some tightness in chest , discuss results  ct scan that she had done on 06/17/23 at [facility]  , learn more about her dx and is this family hx. Discuss treatment         HPI LYNEL FORESTER 74 y.o. -presents with her son Lemond over concerns that she has interstitial lung disease.  She tells me that she has a strong family history of interstitial lung disease.  Her mother died 45 years ago from pulmonary fibrosis.  She is the youngest of 9 siblings and she is the fourth to have interstitial lung disease/pulmonary fibrosis.  The second older sibling brother died in 10/18/2004 from pulmonary fibrosis.  The fifth oldest died in 10-19-2015 from pulmonary fibrosis was a female.  Then the eighth oldest was a female who died in October 18, 2010 from pulmonary fibrosis.  She denies any bird exposure in the house.  Denies any mold.  Denies any down so first.  Denies any autoimmune disease.  Denies any Raynaud's.  No history of collagen vascular disease or connective tissue disease.  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  She was exposed to wood dust.  She is also worked in the [employer].  There is no acid reflux or MI or stroke.   In 10-18-20 she did have a CT abdomen lung images show presence of ILD on report but I could not visualize this image.  But in 2018/10/18 for October she had a CT chest in the [facility] that is  actually available for my visualization.  I agree with the NSIP report alternate diagnosis.  But she tells me that she is been short of breath for the last 6 months along with cough.  The cough started first.  In spring 2024 she got sick with a respiratory viral infection and it felt like the cough never went away.  Then in August 2020 for the shortness of breath started.  Is present with exertion relieved by rest.  Present for going from the bedroom to the bathroom washing close dishes and doing groceries.  She does not know if it is progressive but the son does feel it is progressive.  Both of them attest that a year ago around Christmas 2023 she was fine.   CT chest findings in ABD CT April 2022 -could not visualize this at all.  FINDINGS:  Lower chest: Diffuse chronic interstitial coarsening. The visualized  lung bases are otherwise clear. There is coronary vascular  calcification.     CT Chest oct 2024 at [facility] = personally visualized and agree with the findings   IMPRESSION:  1. Chronic interstitial lung disease with patchy multifocal  ground-glass attenuation, subpleural and peribronchovascular  reticulation and architectural distortion. Findings are most  consistent with chronic fibrotic interstitial lung disease,  potentially  NSIP. Consider follow-up high-resolution chest CT in 3-6  months.  2. No confluent airspace disease or suspicious pulmonary nodule  demonstrated.  3. No definite acute chest findings.  4.  Aortic Atherosclerosis (ICD10-I70.0).    Electronically Signed    By: Elsie Perone M.D.    On: 06/25/2023 10:44    CT Chest data from date: 07/23/23 coronary CT  - personally visualized and independently interpreted : Yes - my findings are: NSIP groundglass opacities alternate diagnosis.  ADDENDUM REPORT: 08/09/2023 18:33   EXAM: OVER-READ INTERPRETATION  CT CHEST   The following report is an over-read performed by radiologist Dr. Fonda Mom  [facility] on 08/09/2023. This over-read does not include interpretation of cardiac or coronary anatomy or pathology. The coronary CTA interpretation by the cardiologist is attached.   COMPARISON:  06/17/2023.   FINDINGS: Cardiovascular:  See findings discussed in the body of the report.   Mediastinum/Nodes: No suspicious adenopathy identified. Imaged mediastinal structures are unremarkable.   Lungs/Pleura: Extensive interstitial prominence diffusely consistent with pulmonary edema versus atypical infection or interstitial lung disease. No alveolar consolidation. No pleural effusion or pneumothorax.   Upper Abdomen: No acute abnormality.   Musculoskeletal: No chest wall abnormality. No acute osseous findings. There are thoracic degenerative changes.   IMPRESSION: Extensive interstitial prominence consistent with pulmonary edema versus atypical infection or interstitial lung disease.     Electronically Signed   By: Fonda Field M.D.   On: 08/09/2023 18:33  OV 10/09/2023  Subjective:  Patient ID: Heron GORMAN Edelson, female , DOB: 13-Feb-1951 , age 82 y.o. , MRN: [id number] , ADDRESS: [address] [place] [address] PCP Rosamond Leta NOVAK, MD Patient Care Team: Rosamond Leta NOVAK, MD as PCP - General (Internal Medicine) Mallipeddi, Diannah SQUIBB, MD as PCP - Cardiology (Cardiology)  This Provider for this visit: Treatment Team:  Attending Provider: Geronimo Amel, MD    10/09/2023 -   Chief Complaint  Patient presents with   Follow-up    Pt following up with full work up, labs, pft, and ono. Denies any concerns    #ILD workup in progress.  Does not have high-resolution CT scan chest as yet. Interim Health status: No new complaints No new medical problems. No new surgeries. No ER visits. No Urgent care visits. No changes to medications but she did do the ILD questionnaire which we reviewed.  HPI Brooklynn Brandenburg Castelli 74 y.o. -   [place] [facility] Comprehensive ILD  Questionnaire  Symptoms:   Symptoms started suddenly 6 months ago and since then it is progressive.  Cough started in April 2024.  She does cough at night.  She has some clear sputum she does clear her throat. Past Medical History :  -Positive for obesity - Has had COVID disease in September 2021. - Detail connective tissue disease is negative   ROS:  -Positive for fatigue for the last several months arthralgia for several years - Dry eyes for the last few years 5 pound weight loss for the last few weeks  FAMILY HISTORY of LUNG DISEASE:  -Multiple family members with pulmonary fibrosis.  Especially mother, 2 brothers and 1 sister.  Her sister got admitted 1 Easter for ILD.  Apparently it was fairly advanced.  She underwent biopsy in the lung collapse and then by 07-10-26of that year she passed away.  Therefore patient is petrified of event undergoing general anesthesia and she does not want to do lung biopsy.  She is never even had colonoscopy.  PERSONAL EXPOSURE HISTORY:  -She never smoked.  No marijuana no cocaine no intravenous drug use.  HOME  EXPOSURE and HOBBY DETAILS :  [place] but the home itself is 25 years.  There are some mold and mildew in the bathroom but otherwise detail organic antigen exposure history is negative.  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  She retired in 2019.  Before that for 15 years she worked in [employer].  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  She got exposed to cotton and nylon but she denies cutting nylon.  Before that she worked in [employer] for 30 years where she was exposed to wood dust.  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  She is also done drawl lumbar to finish goods.  During this time she was exposed to industrial  dust.  PULMONARY TOXICITY HISTORY (27 items):  Detail o medication history for pulmonary toxicity is negative  INVESTIGATIONS: Pulmonary function test shows at least moderate restriction with reduction in diffusion  No high-resolution CT chest has yet     OV 11/20/2023  Subjective:  Patient ID: Heron GORMAN Edelson, female , DOB: Sep 11, 1950 , age 79 y.o. , MRN: [id number] , ADDRESS: [address] [place] [address] PCP Rosamond Leta NOVAK, MD Patient Care Team: Rosamond Leta NOVAK, MD as PCP - General (Internal Medicine) Mallipeddi, Diannah SQUIBB, MD as PCP - Cardiology (Cardiology)  This Provider for this visit: Treatment Team:  Attending Provider: Geronimo Amel, MD    11/20/2023 -   Chief Complaint  Patient presents with   Follow-up    Increased SOB when lies down at night over the pastr 3 wks. She started OFEV 150 mg 1 wk ago- has had constipation and cramping occ.     HPI Floreen Teegarden Layfield 74 y.o. -returns for follow-up.  The main purpose of this visit is to see uptake with nintedanib now she is only been taking it for 1 week and she is already constipated.  Normally this drug causes diarrhea but it has been paradoxical for her.  She does have some mild cramping but there is no tenesmus.  There is no fevers no dysuria no blood in the stool.  She is also probably a little bit more tired at the end of the day since starting the nintedanib.  From a respiratory standpoint: She think she is stable.  She says shortness of breath of exertion going to the car is improved but by end of the day since starting nintedanib she is a little more tired.  Symptom score and walking desaturation test shows stability  From a ILD etiology standpoint: She had a high-resolution CT chest that I personally visualized it suggestive of NSIP.  I did not appreciate air trapping but the official report is still pending.  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  I personally  communicated via email with the genetic counselor Darice Monte.  The first patient she is seen with this mutation.  The details of the mutation are above.  I shared this with the patient.  She does have dry eye and dry mouth but I did not see any mechanic hands but I did advise the patient because of trace  ANA positivity that we will check a myositis panel and she is in agreement.  Lung biopsy is under consideration but she is hesitant towards anesthesia procedures.  I told her I respected that and the best option would be to discuss in case conference and also monitor for progression and consider immunomodulatory therapy such as CellCept based on the outcomes of the conference and also progression.  She is again aligned with this.   OV 02/10/2024  Subjective:  Patient ID: Heron GORMAN Edelson, female , DOB: 1950/09/30 , age 68 y.o. , MRN: [id number] , ADDRESS: [address] [place] [address] PCP Rosamond Leta NOVAK, MD Patient Care Team: Rosamond Leta NOVAK, MD as PCP - General (Internal Medicine) Mallipeddi, Diannah SQUIBB, MD as PCP - Cardiology (Cardiology)  This Provider for this visit: Treatment Team:  Attending Provider: Geronimo Amel, MD    02/10/2024 -   Chief Complaint  Patient presents with   Interstitial Lung Disease    HPI Brehanna Deveny Clanton 74 y.o. -returns for follow-up.  Presents with her daughter-in-law Mercy.  Since I last saw her in March 2025 she is continue to have constipation and then in mid May 2025 she ended up in the ER with diverticulitis.  She called our office and was sent to the ER.  She was then discharged on Flagyl and ciprofloxacin which she says tore my stomach up.  She describes rumbling, pain, nausea and all of this was worse.  And then this was stopped and she was put on Augmentin  which was also similar side effects but she finished 5 days of it last Monday.  At this point in time she is better but she says she is still got diminished appetite and aversion to food nausea and  abdominal cramping.  Throughout although she is continued her nintedanib.  I did indicate to her nintedanib can cause similar side effects.  She was surprised she does not want to stop the nintedanib ideally because of the interstitial lung disease and strong family history and propensity for progression.  But dyspnea wise she is stable.  Her exercise hypoxemia test is stable and her pulmonary function test is improved/stable.  Overall I believe the ILD itself is stable.   CT Chest data from date: March 2025 Narrative & Impression  CLINICAL DATA:  Diffuse/interstitial lung disease. Chronic congestion and wheezing.   EXAM: CT CHEST WITHOUT CONTRAST   TECHNIQUE: Multidetector CT imaging of the chest was performed following the standard protocol without intravenous contrast. High resolution imaging of the lungs, as well as inspiratory and expiratory imaging, was performed.   RADIATION DOSE REDUCTION: This exam was performed according to the departmental dose-optimization program which includes automated exposure control, adjustment of the mA and/or kV according to patient size and/or use of iterative reconstruction technique.   COMPARISON:  06/17/2023.   FINDINGS: Cardiovascular: Atherosclerotic calcification of the aorta and left anterior descending coronary artery. Heart is at the upper limits of normal in size to mildly enlarged. No pericardial effusion.   Mediastinum/Nodes: No pathologically enlarged mediastinal or axillary lymph nodes. Hilar regions are difficult to definitively evaluate without IV contrast. Esophagus is grossly unremarkable.   Lungs/Pleura: Interstitial coarsened ground-glass with subpleural reticulation, ground-glass and traction bronchiectasis/bronchiolectasis, findings similar to 06/17/2023. There may be slight basilar predominance of the findings. Expiratory phase imaging was not performed in true expiration, limiting the evaluation for air trapping. No  pleural fluid. Airway is unremarkable.   Upper Abdomen: Peripherally calcified splenic artery aneurysm measures 7  mm. Visualized portions of the liver, gallbladder, adrenal glands, kidneys, spleen, pancreas, stomach and bowel are otherwise grossly unremarkable. No upper abdominal adenopathy.   Musculoskeletal: Degenerative changes in the spine.   IMPRESSION: 1. Pulmonary parenchymal pattern of interstitial lung may be due to fibrotic nonspecific interstitial pneumonitis or usual interstitial pneumonitis. Findings are indeterminate for UIP per consensus guidelines: Diagnosis of Idiopathic Pulmonary Fibrosis: An Official ATS/ERS/JRS/ALAT Clinical Practice Guideline. Am JINNY Honey Crit Care Med Vol 198, Iss 5, [id number], May 10 2017. 2. 7 mm peripherally calcified splenic artery aneurysm. 3. Aortic atherosclerosis (ICD10-I70.0). Left anterior descending coronary artery calcification.     Electronically Signed   By: Newell Eke M.D.   On: 11/20/2023 16:19       04/29/2024 Follow up ; ILD  Discussed the use of AI scribe software for clinical note transcription with the patient, who gave verbal consent to proceed.  History of Present Illness HAYDN CUSH is a 74 year old female with ILD (NSIP)  who presents with severe gastrointestinal symptoms and weight loss. Since her last visit in June, she has been experiencing severe gastrointestinal symptoms, including episodes of diarrhea and constipation, nausea, and significant weight loss.   She was recommended to use MiraLAX to manage constipation.  Last visit patient was recommended for a drug holiday with her Ofev.  She says as soon as she restarted it she continued to have ongoing daily nausea low appetite and weight has been going down she is down 20 pounds since earlier this year.  She said during both of these episodes she was severely sick and still has not totally recovered. She is currently taking Ofev twice daily.  She is  unable to perform daily activities such as standing at the kitchen sink or attending church without feeling sick. She denies any flare of coughing.  But does get short of breath and has low energy currently Most recent high-resolution CT chest November 10, 2023 showed interstitial groundglass with subpleural reticulation and traction bronchiectasis similar to August 2024 findings are indeterminate for UIP.  Appeared fibrotic nonspecific interstitial pneumonitis   OV 06/29/2024  Subjective:  Patient ID: Heron GORMAN Edelson, female , DOB: Jan 23, 1951 , age 78 y.o. , MRN: [id number] , ADDRESS: [address] [place] [address] PCP Rosamond Leta NOVAK, MD Patient Care Team: Rosamond Leta NOVAK, MD as PCP - General (Internal Medicine) Mallipeddi, Diannah SQUIBB, MD as PCP - Cardiology (Cardiology)  This Provider for this visit: Treatment Team:  Attending Provider: Geronimo Amel, MD  06/29/2024 -   Chief Complaint  Patient presents with   Interstitial Lung Disease    PFT F/U Pt states since LOV breathing has gotten worse more SOB occurring w/ any activity done  Pt also states at night time she has a harder time breathing  Dry cough      HPI QUANEISHA HANISCH 74 y.o. -returns for followup; . Presents with daughter. After stopping ofev x 2  months GI issues all resolved. However, feeeling worsening DOE Some 4-6 weeks ago started feeling suffocating at night. And for 3-4 weeks insidious onset worsening DOE. Difficulty doing groceries. Difficulty walking car to home. Symptom socore shows worsening dyspnea. She was slow doing eercise hypoxemia test and could not hit goal of 15 X but got very dyspenci without rise in HR > 90 and change in pulse ox. Of note, she did fall 06/17/24 early in morning on buttocks and as bruise on tail bone. Seen in ER by Dr Josephine -  notes reviewed. PFT shows stable FVC and some reduction in DLCO > FVC raising concern for Pulm HTn.   Of note she does have significant baseline varicose  veints  PFT   07/15/2024: Today - follow up Discussed the use of AI scribe software for clinical note transcription with the patient, who gave verbal consent to proceed.  History of Present Illness SHELISE MARON is a 74 year old female with pulmonary fibrosis who presents for follow up.   She has been experiencing increased shortness of breath over the last several months, which worsens when she lies down at night. She feels unchanged compared to her last visit. No significant cough. No wheezing, chest tightness/pain, syncope, palpitations, leg swelling.   She had a CTA chest that was negative for PE or acute process. No obvious progression of ILD. She had a repeat PFT that showed stable FVC but decline in DLCO compared to prior. Her echocardiogram was unremarkable; unable to measure pulmonary artery pressures.   She has no known history of sleep apnea and does not snore to her knowledge. An overnight oxygen study was ordered to assess for oxygen level drops and respiratory disturbances during sleep, but the results are not yet available. She feels tired during the day, regardless of how long she sleeps. No morning headaches, sleep parasomnias/paralysis. No drowsy driving. No sleep aids.    OV 09/21/2024  Subjective:  Patient ID: Heron GORMAN Edelson, female , DOB: 14-Aug-1951 , age 74 y.o. , MRN: [id number] , ADDRESS: [address] [place] [address] PCP Rosamond Leta NOVAK, MD Patient Care Team: Rosamond Leta NOVAK, MD as PCP - General (Internal Medicine) Mallipeddi, Diannah SQUIBB, MD as PCP - Cardiology (Cardiology)  This Provider for this visit: Treatment Team:  Attending Provider: Geronimo Amel, MD    #ILD-with family history.  Unclear specific variety - RADIOOGICA NSIP  -March 2025: Genetic testing with Darice Monte  -  single pathogenic variant in the PARN gene. Specifically, this variant is c.500C>G (p.Ser167*).   - he expression of pathogenic PARN mutations can range from IPF to Dyskeratosis  Congenita (DC).  DC is characterized by abnormal skin pigmentation, nail dystrophy, oral leukoplakia, progressive bone marrow failure, pulmonary fibrosis and increased risk for certain cancers such as squamous cell carcinoma and hematolymphoid neoplasms. Some individuals with a pathogenic PARN variant can develop a severe form of DC called Hoyeraal-Hreidarsson syndrome (HHS).   #Nintedanib/Ofev   -Started first dose March 2025.SABRA Course complicated by   - Two major episodes of diverticulitis required treatment in the emergency room in May and also in August .  She has been seen by gastroenterology and underwent colonoscopy that showed diverticulosis.. Also 20# weight loss  - stopped Aug 2025  #Normal stress test November 2024. #PH-ILD - WHO GRUP 3 on 08/25/24  Right Heart Pressures [facility] Procedural Findings: Hemodynamics (mmHg) RA mean 4 RV 46/7 PA 49/16, mean 30 PCWP mean 8  Oxygen saturations: PA 69% AO 96%  Cardiac Output (Fick) 4.61  Cardiac Index (Fick) 2.66 PVR 4.8 WU     09/21/2024 -   Chief Complaint  Patient presents with   Medical Management of Chronic Issues   Interstitial Lung Disease    PFT done today. Breathing has been slightly worse x 3 wks. She has some non prod cough.      HPI Shaundrea Carrigg Schwertner 74 y.o. -MELVINIA ASHBY is a 74 year old female with pulmonary fibrosis who presents with worsening shortness of breath. She is accompanied  by her nephew whi is 5 and hi s wife.  Mid December 2020 for she underwent right heart catheterization and shows significant WHO group 3 PAH ILD associated with a PVR greater than 4.  Normal wedge pressures.  She has been experiencing worsening shortness of breath, particularly during physical activities, with symptoms becoming more pronounced since Thanksgiving. She finds it increasingly difficult to walk and does not use oxygen at night or during ambulation, having never been on oxygen therapy before. She describes a sensation of  tightness or soreness in her chest, even when sitting still.  She discontinued Ofev, a medication previously prescribed for her pulmonary fibrosis, due to adverse effects.    Her family history includes several cases of pulmonary fibrosis. She has a history of smoking for about forty to fifty years, but notes that even nonsmokers in her family have developed similar conditions.  Her nephew was very concerned about this.   She had overnight pulse oximetry's test 2 months ago and was normal.  Today sit/stand test suggested desaturation but when we walked her 200 feet x 3 equal 600 feet she did not desaturate though she did drop more than 3 points.   SYMPTOM SCALE - ILD 10/09/2023 11/20/2023 OFEV now -having paradoxical constipation 02/10/2024 Mid May 2020 for ER visit for diverticulitis 06/29/2024 Off ofev since aug 2025 09/21/2024 No anti fibortic New PH-ILD dx  Current weight       O2 use ra ra ra ra ra  Shortness of Breath 0 -> 5 scale with 5 being worst (score 6 If unable to do)      At rest 3 2 2 3 1   Simple tasks - showers, clothes change, eating, shaving 3 2 2 4 4   Household (dishes, doing bed, laundry) 4 2 2 4 4   Shopping 4 3 2 5 4   Walking level at own pace 4 3 3 4  3.5  Walking up Stairs 4 3 4 5 5   Total (30-36) Dyspnea Score 21 15 15 27  21.5  How bad is your cough? 2 1 2 3 3   How bad is your fatigue 4 3 4 3 4   How bad is nausea 0 0 5 0 0  How bad is vomiting?  0 0 0 0 0  How bad is diarrhea? 0 0 3 0 00  How bad is anxiety? 4 3 2 3 2   How bad is depression 0 2 0 0 2  Any chronic pain - if so where and how bad 0 0 0 0             SIT STAND TEST - goal 15 times   02/10/2024  06/29/2024   O2 used ra ra  PRobe - finter or forehead finger Onlt 10 times due to recent fall and burisi tail bone  Number sit and stand completed - goal 15 15 10   Time taken to complete 2 min 1 min and 6 sec  Resting Pulse Ox/HR/Dyspnea  98% and 87/min and dyspnea of 0/10  97% andHR 59 and  score 3  Peak measures 97 % and 99/min and dyspnea of 2/10 96% , HR 76 and score 8  Final Pulse Ox/HR 97% and 90/min and dyspnea of 1/10 97% and HR 62 and score 4  Desaturated </= 88% no no  Desaturated <= 3% points no no  Got Tachycardic >/= 90/min yes no  Miscellaneous comments x Out of proportio dyspnea    Simple office walk 224 (66+46 x 2) feet  Pod A at [facility] x  3 laps goal with forehead probe 09/21/2024    O2 used ra   Number laps completed 3   Comments about pace stead   Resting Pulse Ox/HR 97% and 59/min   Final Pulse Ox/HR 92% and 93/min   Desaturated </= 88% no   Desaturated <= 3% points yes   Got Tachycardic >/= 90/min yes   Symptoms at end of test Mild dyspnea   Miscellaneous comments x       PFT     Latest Ref Rng & Units 09/21/2024    8:06 AM 06/29/2024    2:39 PM 01/30/2024   12:36 PM 10/09/2023   12:51 PM  PFT Results  FVC-Pre L 1.39  P 1.42  1.52  1.38   FVC-Predicted Pre % 55  P 56  59  54   FVC-Post L    1.40   FVC-Predicted Post %    55   Pre FEV1/FVC % % 82  P 67  74  68   Post FEV1/FCV % %    77   FEV1-Pre L 1.14  P 0.95  1.12  0.94   FEV1-Predicted Pre % 61  P 50  58  48   FEV1-Post L    1.08   DLCO uncorrected ml/min/mmHg 9.57  P 8.87  19.11  11.63   DLCO UNC% % 56  P 52  111  68   DLCO corrected ml/min/mmHg 9.72  P 8.84   11.40   DLCO COR %Predicted % 57  P 51   66   DLVA Predicted % 118  P 94  105  113   TLC L    2.53   TLC % Predicted %    56   RV % Predicted %    61     P Preliminary result       LAB RESULTS last 96 hours No results found.       has a past medical history of Arthritis, Depression, Dyspnea, Family history of pancreatic cancer, Hypertension, Pulmonary fibrosis (HCC) (05/2023), SI (sacroiliac) joint dysfunction, Spondylolisthesis, Wears glasses, and Wears partial dentures.   reports that she has never smoked. She has never used smokeless tobacco.  Past Surgical History:  Procedure Laterality Date    ABDOMINAL HYSTERECTOMY     BACK SURGERY  05/28/2018   PLIF   CATARACT EXTRACTION W/PHACO Left 10/06/2014   Procedure: CATARACT EXTRACTION PHACO AND INTRAOCULAR LENS PLACEMENT LEFT EYE;  Surgeon: Cherene Mania, MD;  Location: [facility];  Service: Ophthalmology;  Laterality: Left;  CDE:5.49   CATARACT EXTRACTION W/PHACO Right 10/17/2014   Procedure: CATARACT EXTRACTION PHACO AND INTRAOCULAR LENS PLACEMENT RIGHT EYE CDE=9.81;  Surgeon: Cherene Mania, MD;  Location: [facility];  Service: Ophthalmology;  Laterality: Right;   COLONOSCOPY N/A 03/24/2024   Procedure: COLONOSCOPY;  Surgeon: Eartha Angelia Sieving, MD;  Location: [facility];  Service: Gastroenterology;  Laterality: N/A;  9:00 am, asa 1/2   MULTIPLE TOOTH EXTRACTIONS     RIGHT HEART CATH N/A 08/25/2024   Procedure: RIGHT HEART CATH;  Surgeon: Rolan Ezra RAMAN, MD;  Location: [facility] INVASIVE CV LAB;  Service: Cardiovascular;  Laterality: N/A;   ROTATOR CUFF REPAIR     right shoulder   SACROILIAC JOINT FUSION Right 11/18/2019   Procedure: RIGHT SACROILIAC JOINT FUSION;  Surgeon: Beuford Anes, MD;  Location: [facility];  Service: Orthopedics;  Laterality: Right;   SHOULDER ARTHROSCOPY WITH SUBACROMIAL DECOMPRESSION, ROTATOR CUFF REPAIR AND BICEP  TENDON REPAIR Left 09/17/2018   Procedure: LEFT SHOULDER ARTHROSCOPY WITH DEBRIDEMENT, SUBACROMIAL DECOMPRESSION, ROTATOR CUFF;  Surgeon: Cristy Bonner DASEN, MD;  Location: [facility];  Service: Orthopedics;  Laterality: Left;   TRANSFORAMINAL LUMBAR INTERBODY FUSION (TLIF) WITH PEDICLE SCREW FIXATION 1 LEVEL Right 08/09/2021   Procedure: RIGHT-SIDED LUMBAR 3 - LUMBAR 4 TRANSFORAMINAL LUMBAR INTERBODY FUSION WITH INSTRUMENTATION AND ALLOGRAFT;  Surgeon: Beuford Anes, MD;  Location: [facility];  Service: Orthopedics;  Laterality: Right;   TUBAL LIGATION      Allergies[1]  Immunization History  Administered Date(s) Administered   Fluad Quad(high Dose 65+) 06/15/2024   Fluad Trivalent(High Dose 65+)  06/11/2023   Td (Adult),5 Lf Tetanus Toxid, Preservative Free 07/07/1998    Family History  Problem Relation Age of Onset   Pulmonary fibrosis Mother 78   Aneurysm Father 78   Diabetes Sister    Hypertension Sister    Pulmonary fibrosis Sister 59   Heart attack Brother        d. 23s   Pulmonary fibrosis Brother 70   Pulmonary fibrosis Brother    Esophageal cancer Brother    Pancreatic cancer Brother    COPD Maternal Aunt    Stroke Paternal Aunt    Pneumonia Maternal Grandmother    Kidney disease Maternal Grandfather    Stroke Paternal Grandmother    Pulmonary fibrosis Cousin        mat first cousin    Current Medications[2]      Objective:   Vitals:   09/21/24 0920  BP: 122/74  Pulse: 61  SpO2: 99%  Weight: 160 lb (72.6 kg)  Height: 5' (1.524 m)    Estimated body mass index is 31.25 kg/m as calculated from the following:   Height as of this encounter: 5' (1.524 m).   Weight as of this encounter: 160 lb (72.6 kg).  @WEIGHTCHANGE @  [employer]   09/21/24 0920  Weight: 160 lb (72.6 kg)     Physical Exam   General: No distress.  Looks stable O2 at rest: no Cane present: no Sitting in wheel chair: no Frail: no Obese: no Neuro: Alert and Oriented x 3. GCS 15. Speech normal Psych: Pleasant Resp:  Barrel Chest - non.  Wheeze - o, Crackles - yes, No overt respiratory distress CVS: Normal heart sounds. Murmurs - no Ext: Stigmata of Connective Tissue Disease - no HEENT: Normal upper airway. PEERL +. No post nasal drip        Assessment/     Assessment & Plan ILD (interstitial lung disease) (HCC)  NSIP (nonspecific interstitial pneumonia) (HCC)  WHO group 3 pulmonary arterial hypertension (HCC)  Chest heaviness  Family history of pulmonary fibrosis    PLAN Patient Instructions     ICD-10-CM   1. ILD (interstitial lung disease) (HCC)  J84.9     2. NSIP (nonspecific interstitial pneumonia) (HCC)  J84.89     3. WHO group 3 pulmonary  arterial hypertension (HCC)  I27.23     4. Exercise hypoxemia  R09.02     5. Chest heaviness  R07.89        # Interstitial lung disease with family history of pulmonary fibrosis  -Previously intolerant to nintedanib  Plan - Start Nerandomilast [extensively counseled about side effect profile and benefits]   #WHO group 3 pulmonary hypertension new diagnosis December 2025  Plan - Start inhaled treprostinil DPI (shared decision making based on risk, benefits and choices between nebulized and DPI] -Take the registry study consent form called  DeciPHER from [place]  # Exercise non-significant hypoxemia  -Overnight pulse oximetry late 2025 showed desaturation below 88% only for 1 minute - Transient desturation to 92% on walk test 09/21/2024   Plan - Monitor without o2  Drug induced constipation History of diverticulitis mid May 2025 and ongoing abdominal symptoms.  Did not tolerate ofev  stopped Aug 2025 - GI symptoms resolved after stopping Ofev  #Family history of pulmonary fibrosis  -Counseled the nephew and his wife and the patient that currently there is no standard  Plan  - Family members were older can consider getting a CT scan of the chest if needed but again this recommendation is without evidence  Plan - MOnitor in [place]  Chest heaviness   Plan  - if getting worse call us  or go to ER  - but let us  see if it improves with treatment  Follow-up - 6 weks with APP/Dr Geronimo  - 12 weeks with Dr Geronimo'; 30 mi    FOLLOWUP    Return for - 6 weks with APP/Dr Geronimo  - 12 weeks with Dr Geronimo'; 30 mi.  ( Level 05 visit E&M 2024: Estb >= 40 min in   in  visit type: on-site physical face to visit  in total care time and counseling or/and coordination of care by this undersigned MD - Dr Dorethia Geronimo. This includes one or more of the following on this same day 09/21/2024: pre-charting, chart review, note writing, documentation discussion of  test results, diagnostic or treatment recommendations, prognosis, risks and benefits of management options, instructions, education, compliance or risk-factor reduction. It excludes time spent by the CMA or office staff in the care of the patient. Actual time 45 min)   SIGNATURE    Dr. Dorethia Geronimo, M.D., F.C.C.P,  Pulmonary and Critical Care Medicine Staff Physician, [facility] Director - Interstitial Lung Disease  Program  Pulmonary Fibrosis [facility] at [facility] [place], [place], [address]  Pager: [phone], If no answer or between  15:00h - 7:00h: call [phone] Telephone: [phone]  12:16 PM 09/21/2024    [1]  Allergies Allergen Reactions   Nintedanib     Abdominal cramps  [2]  Current Outpatient Medications:    busPIRone (BUSPAR) 5 MG tablet, Take 5 mg by mouth 2 (two) times daily., Disp: , Rfl:    gabapentin (NEURONTIN) 300 MG capsule, Take 300 mg by mouth 2 (two) times daily., Disp: , Rfl:    meloxicam  (MOBIC ) 7.5 MG tablet, Take 7.5 mg by mouth daily., Disp: , Rfl:    Menthol -Methyl Salicylate (MUSCLE RUB) 10-15 % CREA, Apply 1 Application topically daily as needed for muscle pain., Disp: , Rfl:    propranolol (INDERAL) 20 MG tablet, Take 20 mg by mouth 3 (three) times daily., Disp: , Rfl:    traMADol (ULTRAM) 50 MG tablet, Take 50 mg by mouth 2 (two) times daily as needed., Disp: , Rfl:    triamterene -hydrochlorothiazide  (DYAZIDE ) 37.5-25 MG capsule, Take 1 tablet by mouth daily. , Disp: , Rfl:    Chlorpheniramine-Phenylephrine  ([facility] SINUS PE/ALLERGY MAX ST PO), Take 1 tablet by mouth every 4 (four) hours. (Patient not taking: Reported on 09/21/2024), Disp: , Rfl:   "

## 2024-09-21 NOTE — Patient Instructions (Addendum)
"    ICD-10-CM   1. ILD (interstitial lung disease) (HCC)  J84.9     2. NSIP (nonspecific interstitial pneumonia) (HCC)  J84.89     3. WHO group 3 pulmonary arterial hypertension (HCC)  I27.23     4. Exercise hypoxemia  R09.02     5. Chest heaviness  R07.89        # Interstitial lung disease with family history of pulmonary fibrosis  -Previously intolerant to nintedanib  Plan - Start Nerandomilast [extensively counseled about side effect profile and benefits]   #WHO group 3 pulmonary hypertension new diagnosis December 2025  Plan - Start inhaled treprostinil DPI (shared decision making based on risk, benefits and choices between nebulized and DPI] -Take the registry study consent form called DeciPHER from [place]  # Exercise non-significant hypoxemia  -Overnight pulse oximetry late 2025 showed desaturation below 88% only for 1 minute - Transient desturation to 92% on walk test 09/21/2024   Plan - Monitor without o2  Drug induced constipation History of diverticulitis mid May 2025 and ongoing abdominal symptoms.  Did not tolerate ofev  stopped Aug 2025 - GI symptoms resolved after stopping Ofev  #Family history of pulmonary fibrosis  -Counseled the nephew and his wife and the patient that currently there is no standard  Plan  - Family members were older can consider getting a CT scan of the chest if needed but again this recommendation is without evidence  Plan - MOnitor in [place]  Chest heaviness   Plan  - if getting worse call us  or go to ER  - but let us  see if it improves with treatment  Follow-up - 6 weks with APP/Dr Geronimo  - 12 weeks with Dr Geronimo'; 30 mi "

## 2024-09-21 NOTE — Progress Notes (Signed)
 Spirometry and diffusion capacity performed today.

## 2024-09-21 NOTE — Patient Instructions (Signed)
 Spirometry and diffusion capacity performed today.

## 2024-09-28 ENCOUNTER — Telehealth: Payer: Self-pay

## 2024-09-28 NOTE — Telephone Encounter (Signed)
 Received [employer] new start paperwork. Opening benefits investigation in this thread, updates to follow.

## 2024-09-28 NOTE — Telephone Encounter (Signed)
 Received Kristin Bush new start paperwork. Opening benefits investigation in this thread, updates to follow.

## 2024-09-29 NOTE — Telephone Encounter (Signed)
 Submitted a Prior Authorization request to [employer] for JASCAYD via CoverMyMeds. Will update once we receive a response.  Key: [id number]

## 2024-09-29 NOTE — Telephone Encounter (Signed)
 Submitted a Prior Authorization request to [employer] for JASCAYD via CoverMyMeds. Will update once we receive a response.  Key: [id number]

## 2024-10-04 ENCOUNTER — Other Ambulatory Visit (HOSPITAL_COMMUNITY): Payer: Self-pay

## 2024-10-04 NOTE — Telephone Encounter (Signed)
 Received notification from [employer] regarding a prior authorization for TYVASO DPI. Authorization has been APPROVED from 09/09/24 to 09/08/25. Approval letter sent to scan center.  Per test claim, copay for 28 days supply is $2049.62  Patient can fill through [facility]: [phone]  Authorization # [id number] Phone # [phone]

## 2024-10-04 NOTE — Telephone Encounter (Addendum)
 Received notification from [employer] regarding a prior authorization for JASCAYD. Authorization has been APPROVED from 09/09/24 to 09/08/25. Approval letter sent to scan center.  Per test claim, copay for 30 days supply is $2049.62  Patient can fill through [facility]: [id number]   Authorization # [id number] Phone # [phone]

## 2024-10-22 ENCOUNTER — Encounter

## 2024-11-02 ENCOUNTER — Ambulatory Visit: Admitting: Adult Health

## 2025-01-04 ENCOUNTER — Ambulatory Visit: Admitting: Internal Medicine
